# Patient Record
Sex: Male | Born: 1940 | Race: White | Hispanic: No | Smoking: Never smoker
Health system: Southern US, Community
[De-identification: ages and names within clinical notes are randomized; demographics above are authoritative.]

## PROBLEM LIST (undated history)

## (undated) DIAGNOSIS — F1020 Alcohol dependence, uncomplicated: Secondary | ICD-10-CM

## (undated) DIAGNOSIS — E785 Hyperlipidemia, unspecified: Secondary | ICD-10-CM

## (undated) DIAGNOSIS — I10 Essential (primary) hypertension: Secondary | ICD-10-CM

## (undated) HISTORY — PX: SHOULDER SURGERY: SHX246

## (undated) HISTORY — DX: Essential (primary) hypertension: I10

## (undated) HISTORY — PX: CHOLECYSTECTOMY: SHX55

## (undated) HISTORY — PX: REVISION TOTAL HIP ARTHROPLASTY: SHX766

## (undated) HISTORY — PX: KNEE ARTHROPLASTY: SHX992

---

## 2020-02-16 ENCOUNTER — Encounter: Payer: Self-pay | Admitting: Student in an Organized Health Care Education/Training Program

## 2020-02-16 ENCOUNTER — Ambulatory Visit
Payer: Medicare Other | Attending: Student in an Organized Health Care Education/Training Program | Admitting: Student in an Organized Health Care Education/Training Program

## 2020-02-16 ENCOUNTER — Other Ambulatory Visit: Payer: Self-pay

## 2020-02-16 VITALS — BP 137/84 | HR 96 | Temp 98.0°F | Resp 18 | Ht 66.0 in | Wt 155.0 lb

## 2020-02-16 DIAGNOSIS — M533 Sacrococcygeal disorders, not elsewhere classified: Secondary | ICD-10-CM | POA: Diagnosis present

## 2020-02-16 DIAGNOSIS — G8929 Other chronic pain: Secondary | ICD-10-CM | POA: Insufficient documentation

## 2020-02-16 DIAGNOSIS — G894 Chronic pain syndrome: Secondary | ICD-10-CM | POA: Insufficient documentation

## 2020-02-16 DIAGNOSIS — Z96643 Presence of artificial hip joint, bilateral: Secondary | ICD-10-CM | POA: Diagnosis present

## 2020-02-16 DIAGNOSIS — M47816 Spondylosis without myelopathy or radiculopathy, lumbar region: Secondary | ICD-10-CM | POA: Insufficient documentation

## 2020-02-16 DIAGNOSIS — M5416 Radiculopathy, lumbar region: Secondary | ICD-10-CM | POA: Diagnosis not present

## 2020-02-16 DIAGNOSIS — M48062 Spinal stenosis, lumbar region with neurogenic claudication: Secondary | ICD-10-CM | POA: Insufficient documentation

## 2020-02-16 DIAGNOSIS — Z96653 Presence of artificial knee joint, bilateral: Secondary | ICD-10-CM | POA: Diagnosis present

## 2020-02-16 MED ORDER — GABAPENTIN 100 MG PO CAPS
ORAL_CAPSULE | ORAL | 0 refills | Status: DC
Start: 2020-02-16 — End: 2020-05-18

## 2020-02-16 NOTE — Progress Notes (Signed)
Safety precautions to be maintained throughout the outpatient stay will include: orient to surroundings, keep bed in low position, maintain call bell within reach at all times, provide assistance with transfer out of bed and ambulation.  

## 2020-02-16 NOTE — Progress Notes (Signed)
Patient: Zachary Fernandez  Service Category: E/M  Provider: Gillis Santa, MD  DOB: 1940-04-25  DOS: 02/16/2020  Referring Provider: Vicente Serene*  MRN: [id number]  Setting: Ambulatory outpatient  PCP: No primary care provider on file.  Type: New Patient  Specialty: Interventional Pain Management    Location: Office  Delivery: Face-to-face     Primary Reason(s) for Visit: Encounter for initial evaluation of one or more chronic problems (new to examiner) potentially causing chronic pain, and posing a threat to normal musculoskeletal function. (Level of risk: High) CC: Back Pain (Left, lower)  HPI  Zachary Fernandez is a 80 y.o. year old, male patient, who comes for the first time to our practice referred by Vicente Serene* for our initial evaluation of his chronic pain. He has Chronic SI joint pain; Status post hip replacement, bilateral; History of bilateral knee replacement; Spinal stenosis, lumbar region, with neurogenic claudication; Chronic radicular lumbar pain; Lumbar spondylosis; and Chronic pain syndrome on their problem list. Today he comes in for evaluation of his Back Pain (Left, lower)  Pain Assessment: Location: Lower,Left Back Radiating: denies Onset: More than a month ago Duration: Chronic pain Quality: Other (Comment) (pinching) Severity: 2 /10 (subjective, self-reported pain score)  Effect on ADL: none Timing: Constant Modifying factors: Tramadol BP: 137/84  HR: 96  Onset and Duration: Sudden and Present longer than 3 months Cause of pain: Unknown Severity: No change since onset, NAS-11 at its worse: 5/10, NAS-11 at its best: 2/10, NAS-11 now: 3/10 and NAS-11 on the average: 3/10 Timing: Not influenced by the time of the day, During activity or exercise and After activity or exercise Aggravating Factors: Bending, Climbing, Lifiting, Motion, Stooping , Twisting and Working Alleviating Factors: Medications, Nerve blocks, Sleeping and Chiropractic  manipulations Associated Problems: Inability to control bowel and Pain that wakes patient up Quality of Pain: Aching, Intermittent, Deep, Nagging, Shooting and Stabbing Previous Examinations or Tests: Nerve block, Nerve conduction test and Chiropractic evaluation Previous Treatments: Chiropractic manipulations, Epidural steroid injections and Narcotic medications  Zachary Fernandez is a pleasant 80 year old male who has recently moved from [place].  He has a history of chronic pain.  He was being seen by pain management in [place].  He is managed on tramadol 50 mg 3 times daily as needed which he takes very sporadically.  He has a history of bilateral hip replacement, bilateral knee replacement.  He has a history of low back pain related to lumbar facet arthropathy, lumbar spondylosis, lumbar degenerative disc disease.  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  Patient has had many lumbar epidural steroid injections in [place] which were somewhat helpful for his low back and leg pain.  Most recently, he is status post left S1-S2-S3 lateral branch nerve block with Dr. Shelton Silvas who was his pain physician and [facility] [place] [facility].  Patient also has had left sacroiliac 1 injections in the past.  He has not had a lateral branch RFA to his knowledge.  Patient's most recent MRI from 2019 shows L1 compression fracture with anterior wedging but no retropulsion, multilevel lumbar degenerative disc disease with bilateral facet degenerative joint disease resulting in mild central canal stenosis at L2-L3 and moderate bilateral neuroforaminal stenosis at L2-L3, L3-4, L4-5 and L5-S1.  He has done physical therapy in the past and goes to the gym every other day for 30 minutes.  He denies having tried gabapentin or Lyrica in the past.   Historic  Controlled Substance Pharmacotherapy Review  PMP and historical list of controlled substances: 02/05/2020  10/29/2019   1   Tramadol Hcl 50 Mg Tablet  90.00  30  Ch Chr  [id number]  Exp ([id number])  2/2  15.00 MME  Comm Ins  [place]     Historical Monitoring: The patient  reports no history of drug use. List of all UDS Test(s): No results found for: MDMA, COCAINSCRNUR, [place], [place], CANNABQUANT, [place], [place] List of other Serum/Urine Drug Screening Test(s):  No results found for: AMPHSCRSER, BARBSCRSER, BENZOSCRSER, COCAINSCRSER, COCAINSCRNUR, PCPSCRSER, PCPQUANT, THCSCRSER, THCU, CANNABQUANT, OPIATESCRSER, OXYSCRSER, PROPOXSCRSER, ETH Historical Background Evaluation: [name] PMP: PDMP not reviewed this encounter. Online review of the past 55-monthperiod conducted.             [place] Department of public safety, offender search: (Editor, commissioningInformation) Non-contributory Risk Assessment Profile: Aberrant behavior: None observed or detected today Risk factors for fatal opioid overdose: None identified today Fatal overdose hazard ratio (HR): Calculation deferred Non-fatal overdose hazard ratio (HR): Calculation deferred Risk of opioid abuse or dependence: 0.7-3.0% with doses ? 36 MME/day and 6.1-26% with doses ? 120 MME/day. Substance use disorder (SUD) risk level: See below Personal History of Substance Abuse (SUD-Substance use disorder):  Alcohol: Negative  Illegal Drugs: Negative  Rx Drugs: Negative  ORT Risk Level calculation: Low Risk  Opioid Risk Tool - 02/16/20 1135      Family History of Substance Abuse   Alcohol Negative    Illegal Drugs Negative    Rx Drugs Negative      Personal History of Substance Abuse   Alcohol Negative    Illegal Drugs Negative    Rx Drugs Negative      Age   Age between 122-45years  No      History of Preadolescent Sexual Abuse   History of Preadolescent Sexual Abuse Negative or Male      Psychological Disease   Psychological Disease Negative    Depression Negative      Total Score   Opioid Risk Tool Scoring 0    Opioid Risk Interpretation Low Risk          ORT  Scoring interpretation table:  Score <3 = Low Risk for SUD  Score between 4-7 = Moderate Risk for SUD  Score >8 = High Risk for Opioid Abuse   PHQ-2 Depression Scale:  Total score: 0  PHQ-2 Scoring interpretation table: (Score and probability of major depressive disorder)  Score 0 = No depression  Score 1 = 15.4% Probability  Score 2 = 21.1% Probability  Score 3 = 38.4% Probability  Score 4 = 45.5% Probability  Score 5 = 56.4% Probability  Score 6 = 78.6% Probability   PHQ-9 Depression Scale:  Total score: 0  PHQ-9 Scoring interpretation table:  Score 0-4 = No depression  Score 5-9 = Mild depression  Score 10-14 = Moderate depression  Score 15-19 = Moderately severe depression  Score 20-27 = Severe depression (2.4 times higher risk of SUD and 2.89 times higher risk of overuse)   Pharmacologic Plan: As per protocol, I have not taken over any controlled substance management, pending the results of ordered tests and/or consults.            Initial impression: Pending review of available data and ordered tests.  Meds   Current Outpatient Medications:  .  amLODipine (NORVASC) 5 MG tablet, Take 5 mg by mouth daily., Disp: , Rfl:  .  Barberry-Oreg Grape-Goldenseal (BERBERINE COMPLEX PO),  Take by mouth daily., Disp: , Rfl:  .  bisoprolol-hydrochlorothiazide (ZIAC) 5-6.25 MG tablet, Take 1 tablet by mouth daily., Disp: , Rfl:  .  Calcium Citrate (CITRACAL PO), Take 650 mg by mouth. 2 tabs in am, Disp: , Rfl:  .  desipramine (NORPRAMIN) 25 MG tablet, Take 25 mg by mouth at bedtime., Disp: , Rfl:  .  gabapentin (NEURONTIN) 100 MG capsule, Take 1 capsule (100 mg total) by mouth at bedtime for 10 days, THEN 2 capsules (200 mg total) at bedtime for 10 days, THEN 3 capsules (300 mg total) at bedtime for 20 days. Follow written titration schedule.., Disp: 90 capsule, Rfl: 0 .  Multiple Vitamins-Minerals (CENTRUM SILVER PO), Take by mouth daily., Disp: , Rfl:  .  rosuvastatin (CRESTOR) 20 MG  tablet, Take 20 mg by mouth daily., Disp: , Rfl:  .  tamsulosin (FLOMAX) 0.4 MG CAPS capsule, Take 0.4 mg by mouth., Disp: , Rfl:  .  Testosterone 10 MG/ACT (2%) GEL, Place 4 Pump onto the skin daily., Disp: , Rfl:  .  traMADol (ULTRAM) 50 MG tablet, Take by mouth as needed., Disp: , Rfl:  .  TURMERIC PO, Take by mouth daily., Disp: , Rfl:    ROS  Cardiovascular: High blood pressure Pulmonary or Respiratory: Snoring  Neurological: No reported neurological signs or symptoms such as seizures, abnormal skin sensations, urinary and/or fecal incontinence, being born with an abnormal open spine and/or a tethered spinal cord Psychological-Psychiatric: No reported psychological or psychiatric signs or symptoms such as difficulty sleeping, anxiety, depression, delusions or hallucinations (schizophrenial), mood swings (bipolar disorders) or suicidal ideations or attempts Gastrointestinal: Heartburn due to stomach pushing into lungs (Hiatal hernia) and Alternating episodes 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) Genitourinary: No reported renal or genitourinary signs or symptoms such as difficulty voiding or producing urine, peeing blood, non-functioning kidney, kidney stones, difficulty emptying the bladder, difficulty controlling the flow of urine, or chronic kidney disease Hematological: Brusing easily Endocrine: No reported endocrine signs or symptoms such as high or low blood sugar, rapid heart rate due to high thyroid levels, obesity or weight gain due to slow thyroid or thyroid disease Rheumatologic: Joint aches and or swelling due to excess weight (Osteoarthritis) Musculoskeletal: Negative for myasthenia gravis, muscular dystrophy, multiple sclerosis or malignant hyperthermia Work History: Retired  Allergies  Mr. Cumming has No Known Allergies.  Laboratory Chemistry Profile   Renal No results found for: BUN, CREATININE, LABCREA, BCR, GFR, GFRAA, GFRNONAA, SPECGRAV, PHUR,  PROTEINUR   Electrolytes No results found for: NA, K, CL, CALCIUM, MG, PHOS   Hepatic No results found for: AST, ALT, ALBUMIN, ALKPHOS, AMYLASE, LIPASE, AMMONIA   ID No results found for: LYMEIGGIGMAB, HIV, SARSCOV2NAA, STAPHAUREUS, MRSAPCR, HCVAB, PREGTESTUR, RMSFIGG, QFVRPH1IGG, QFVRPH2IGG, LYMEIGGIGMAB   Bone No results found for: VD25OH, CB762GB1DVV, OH6073XT0, GY6948NI6, 25OHVITD1, 25OHVITD2, 25OHVITD3, TESTOFREE, TESTOSTERONE   Endocrine No results found for: GLUCOSE, GLUCOSEU, HGBA1C, TSH, FREET4, TESTOFREE, TESTOSTERONE, SHBG, ESTRADIOL, ESTRADIOLPCT, ESTRADIOLFRE, LABPREG, ACTH, CRTSLPL, UCORFRPERLTR, UCORFRPERDAY, CORTISOLBASE, LABPREG   Neuropathy No results found for: VITAMINB12, FOLATE, HGBA1C, HIV   CNS No results found for: COLORCSF, APPEARCSF, RBCCOUNTCSF, WBCCSF, POLYSCSF, LYMPHSCSF, EOSCSF, PROTEINCSF, GLUCCSF, JCVIRUS, CSFOLI, IGGCSF, LABACHR, ACETBL, LABACHR, ACETBL   Inflammation (CRP: Acute  ESR: Chronic) No results found for: CRP, ESRSEDRATE, LATICACIDVEN   Rheumatology No results found for: RF, ANA, LABURIC, URICUR, LYMEIGGIGMAB, LYMEABIGMQN, HLAB27   Coagulation No results found for: INR, LABPROT, APTT, PLT, DDIMER, LABHEMA, VITAMINK1, AT3   Cardiovascular No results found for: BNP,  CKTOTAL, CKMB, TROPONINI, HGB, HCT, LABVMA, EPIRU, EPINEPH24HUR, NOREPRU, NOREPI24HUR, DOPARU, DOPAM24HRUR   Screening No results found for: [place], [place], STAPHAUREUS, MRSAPCR, HCVAB, HIV, PREGTESTUR   Cancer No results found for: CEA, CA125, LABCA2   Allergens No results found for: ALMOND, APPLE, ASPARAGUS, AVOCADO, BANANA, BARLEY, BASIL, BAYLE[place], GREENBEAN, LIMABEAN, WHITEBEAN, BEEFIGE, REDBEET, BLUEBERRY, BROCCOLI, CABBAGE, MELON, CARROT, CASEIN, CASHEWNUT, CAULIFLOWER, CELERY     Note: Lab results reviewed.  PFSH  Drug: Mr. Cieslinski  reports no history of drug use. Alcohol:  reports current alcohol use. Tobacco:  reports that he has never smoked. He has  never used smokeless tobacco. Medical:  has a past medical history of Hypertension. Family: family history is not on file.   Active Ambulatory Problems    Diagnosis Date Noted  . 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/16/2020  . Status post hip replacement, 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  . 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  . Spinal stenosis, lumbar region, 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  . 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  . Lumbar spondylosis 02/16/2020  . Chronic pain syndrome 02/16/2020   Resolved Ambulatory Problems    Diagnosis Date Noted  . No Resolved Ambulatory Problems   Past Medical History:  Diagnosis Date  . Hypertension    Constitutional Exam  General appearance: Well nourished, well developed, and well hydrated. In no apparent acute distress Vitals:   02/16/20 1117  BP: 137/84  Pulse: 96  Resp: 18  Temp: 98 F (36.7 C)  TempSrc: Temporal  SpO2: 100%  Weight: 155 lb (70.3 kg)  Height: 5' 6"  (1.676 m)   BMI Assessment: Estimated body mass index is 25.02 kg/m as calculated from the following:   Height as of this encounter: 5' 6"  (1.676 m).   Weight as of this encounter: 155 lb (70.3 kg).  BMI interpretation table: BMI level Category Range association with higher incidence of chronic pain  <18 kg/m2 Underweight   18.5-24.9 kg/m2 Ideal body weight   25-29.9 kg/m2 Overweight Increased incidence by 20%  30-34.9 kg/m2 Obese (Class I) Increased incidence by 68%  35-39.9 kg/m2 Severe obesity (Class II) Increased incidence by 136%  >40 kg/m2 Extreme obesity (Class III) Increased incidence by 254%   Patient's current BMI Ideal Body weight  Body mass index is 25.02 kg/m. Ideal body weight: 63.8 kg (140 lb 10.5 oz) Adjusted ideal body weight: 66.4 kg (146 lb 6.3 oz)   BMI Readings from Last 4 Encounters:  02/16/20 25.02 kg/m   Wt Readings from Last 4 Encounters:  02/16/20 155 lb (70.3 kg)    Psych/Mental status: Alert, oriented x 3 (person,  place, & time)       Eyes: PERLA Respiratory: No evidence of acute respiratory distress  Cervical Spine Exam  Skin & Axial Inspection: No masses, redness, edema, swelling, or associated skin lesions Alignment: Symmetrical Functional ROM: Unrestricted ROM      Stability: No instability detected Muscle Tone/Strength: Functionally intact. No obvious neuro-muscular anomalies detected. Sensory (Neurological): Unimpaired Palpation: No palpable anomalies              Upper Extremity (UE) Exam    Side: Right upper extremity  Side: Left upper extremity  Skin & Extremity Inspection: Skin color, temperature, and hair growth are WNL. No peripheral edema or cyanosis. No masses, redness, swelling, asymmetry, or associated skin lesions. No contractures.  Skin & Extremity Inspection: Skin color, temperature, and hair growth are WNL. No peripheral edema or cyanosis. No masses, redness, swelling, asymmetry, or associated skin lesions. No  contractures.  Functional ROM: Unrestricted ROM          Functional ROM: Unrestricted ROM          Muscle Tone/Strength: Functionally intact. No obvious neuro-muscular anomalies detected.   Muscle Tone/Strength: Functionally intact. No obvious neuro-muscular anomalies detected.  Sensory (Neurological): Unimpaired          Sensory (Neurological): Unimpaired          Palpation: No palpable anomalies              Palpation: No palpable anomalies              Provocative Test(s):  Phalen's test: deferred Tinel's test: deferred Apley's scratch test (touch opposite shoulder):  Action 1 (Across chest): deferred Action 2 (Overhead): deferred Action 3 (LB reach): deferred   Provocative Test(s):  Phalen's test: deferred Tinel's test: deferred Apley's scratch test (touch opposite shoulder):  Action 1 (Across chest): deferred Action 2 (Overhead): deferred Action 3 (LB reach): deferred    Thoracic Spine Area Exam  Skin & Axial Inspection: No masses, redness, or  swelling Alignment: Symmetrical Functional ROM: Unrestricted ROM Stability: No instability detected Muscle Tone/Strength: Functionally intact. No obvious neuro-muscular anomalies detected. Sensory (Neurological): Unimpaired Muscle strength & Tone: No palpable anomalies Carnett's test*: deferred (*Carnett's sign is a clinical examination finding that is useful for confirming whether pain originates from the abdominal viscera or from the abdominal wall. During testing for Carnett's sign, the investigator identifies the point of maximal abdominal pain by deeply palpating with a finger; the patient is then asked to tense the abdominal muscles while the fingertip is released, followed again by deep palpation. If both stages of the test are painful, the source of the pain is the abdominal wall. In contrast, pain originating from the abdominal organs is associated with just a painful first stage) Lumbar Exam  Skin & Axial Inspection: No masses, redness, or swelling Alignment: Symmetrical Functional ROM: Unrestricted ROM       Stability: No instability detected Muscle Tone/Strength: Functionally intact. No obvious neuro-muscular anomalies detected. Sensory (Neurological): Musculoskeletal pain pattern   Gait & Posture Assessment  Ambulation: Unassisted Gait: Relatively normal for age and body habitus Posture: WNL   Lower Extremity Exam    Side: Right lower extremity  Side: Left lower extremity  Stability: No instability observed          Stability: No instability observed          Skin & Extremity Inspection: Evidence of prior arthroplastic surgery  Skin & Extremity Inspection: Evidence of prior arthroplastic surgery  Functional ROM: Mechanically restricted ROM for hip and knee joints          Functional ROM: Mechanically restricted ROM for hip and knee joints          Muscle Tone/Strength: Functionally intact. No obvious neuro-muscular anomalies detected.  Muscle Tone/Strength: Functionally  intact. No obvious neuro-muscular anomalies detected.  Sensory (Neurological): Unimpaired        Sensory (Neurological): Unimpaired        DTR: Patellar: deferred today Achilles: deferred today Plantar: deferred today  DTR: Patellar: deferred today Achilles: deferred today Plantar: deferred today  Palpation: No palpable anomalies  Palpation: No palpable anomalies   Assessment  Primary Diagnosis & Pertinent Problem List: The primary encounter diagnosis was Chronic pain syndrome. Diagnoses of Lumbar spondylosis, Lumbar facet arthropathy, Chronic radicular lumbar pain, Spinal stenosis, lumbar region, with neurogenic claudication, History of bilateral knee replacement, Status post hip replacement, bilateral, and Chronic SI  joint pain were also pertinent to this visit.  Visit Diagnosis (New problems to examiner): 1. Chronic pain syndrome   2. Lumbar spondylosis   3. Lumbar facet arthropathy   4. Chronic radicular lumbar pain   5. Spinal stenosis, lumbar region, with neurogenic claudication   6. History of bilateral knee replacement   7. Status post hip replacement, bilateral   8. Chronic SI joint pain    Plan of Care (Initial workup plan)  Note: Mr. Wallman was reminded that as per protocol, today's visit has been an evaluation only. We have not taken over the patient's controlled substance management.   Lab Orders     Compliance Drug Analysis, Ur  Pharmacotherapy (current): Medications ordered:  Meds ordered this encounter  Medications  . gabapentin (NEURONTIN) 100 MG capsule    Sig: Take 1 capsule (100 mg total) by mouth at bedtime for 10 days, THEN 2 capsules (200 mg total) at bedtime for 10 days, THEN 3 capsules (300 mg total) at bedtime for 20 days. Follow written titration schedule.Marland Kitchen    Dispense:  90 capsule    Refill:  0    Fill one day early if pharmacy is closed on scheduled refill date. May substitute for generic if available.   Medications administered during this  visit: Stephens Shire had no medications administered during this visit.   Pharmacological management options:  Opioid Analgesics: The patient was informed that there is no guarantee that he would be a candidate for opioid analgesics. The decision will be made following CDC guidelines. This decision will be based on the results of diagnostic studies, as well as Mr. Behrle risk profile.   Membrane stabilizer: Trial of gabapentin as above  Muscle relaxant: To be determined at a later time  NSAID: To be determined at a later time  Other analgesic(s): To be determined at a later time   Interventional management options: Mr. Cheek was informed that there is no guarantee that he would be a candidate for interventional therapies. The decision will be based on the results of diagnostic studies, as well as Mr. Pedrosa risk profile.  Procedure(s) under consideration:  Lumbar epidural steroid injection Lumbar facet medial branch nerve blocks S1-S3 lateral branch nerve blocks SI joint injection Sprint peripheral nerve stimulation   Provider-requested follow-up: Return in about 2 weeks (around 03/01/2020) for 2nd pt visit.  Future Appointments  Date Time Provider [place]  02/24/2020 10:40 AM Gillis Santa, MD [facility] None    Note by: Gillis Santa, MD Date: 02/16/2020; Time: 2:41 PM

## 2020-02-19 LAB — COMPLIANCE DRUG ANALYSIS, UR

## 2020-02-24 ENCOUNTER — Ambulatory Visit
Payer: Medicare Other | Attending: Student in an Organized Health Care Education/Training Program | Admitting: Student in an Organized Health Care Education/Training Program

## 2020-02-24 ENCOUNTER — Other Ambulatory Visit: Payer: Self-pay

## 2020-02-24 ENCOUNTER — Encounter: Payer: Self-pay | Admitting: Student in an Organized Health Care Education/Training Program

## 2020-02-24 VITALS — BP 127/82 | HR 83 | Temp 97.2°F | Resp 16 | Ht 66.0 in | Wt 155.0 lb

## 2020-02-24 DIAGNOSIS — Z96643 Presence of artificial hip joint, bilateral: Secondary | ICD-10-CM | POA: Diagnosis present

## 2020-02-24 DIAGNOSIS — M5416 Radiculopathy, lumbar region: Secondary | ICD-10-CM | POA: Diagnosis not present

## 2020-02-24 DIAGNOSIS — G894 Chronic pain syndrome: Secondary | ICD-10-CM | POA: Insufficient documentation

## 2020-02-24 DIAGNOSIS — G8929 Other chronic pain: Secondary | ICD-10-CM | POA: Insufficient documentation

## 2020-02-24 DIAGNOSIS — Z0289 Encounter for other administrative examinations: Secondary | ICD-10-CM | POA: Insufficient documentation

## 2020-02-24 DIAGNOSIS — M47816 Spondylosis without myelopathy or radiculopathy, lumbar region: Secondary | ICD-10-CM | POA: Insufficient documentation

## 2020-02-24 DIAGNOSIS — M48062 Spinal stenosis, lumbar region with neurogenic claudication: Secondary | ICD-10-CM | POA: Insufficient documentation

## 2020-02-24 DIAGNOSIS — M533 Sacrococcygeal disorders, not elsewhere classified: Secondary | ICD-10-CM | POA: Diagnosis present

## 2020-02-24 DIAGNOSIS — Z96653 Presence of artificial knee joint, bilateral: Secondary | ICD-10-CM | POA: Diagnosis present

## 2020-02-24 MED ORDER — TRAMADOL HCL 50 MG PO TABS: 50.0000 mg | ORAL_TABLET | Freq: Three times a day (TID) | ORAL | 2 refills | Status: DC | PRN

## 2020-02-24 NOTE — Progress Notes (Signed)
PROVIDER NOTE: Information contained herein reflects review and annotations entered in association with encounter. Interpretation of such information and data should be left to medically-trained personnel. Information provided to patient can be located elsewhere in the medical record under "Patient Instructions". Document created using STT-dictation technology, any transcriptional errors that may result from process are unintentional.    Patient: Zachary Fernandez  Service Category: E/M  Provider: Gillis Santa, MD  DOB: [birth year]  DOS: 02/24/2020  Specialty: Interventional Pain Management  MRN: [id number]  Setting: Ambulatory outpatient  PCP: No primary care provider on file.  Type: Established Patient    Referring Provider: No ref. provider found  Location: Office  Delivery: Face-to-face     Primary Reason(s) for Visit: Encounter for evaluation before starting new chronic pain management plan of care (Level of risk: moderate) CC: Hip Pain (left)  HPI  Zachary Fernandez is a 80 y.o. year old, male patient, who comes today for a follow-up evaluation to review the test results and decide on a treatment plan. He has Chronic SI joint pain; Status post hip replacement, bilateral; History of bilateral knee replacement; Spinal stenosis, lumbar region, with neurogenic claudication; Chronic radicular lumbar pain; Lumbar spondylosis; Chronic pain syndrome; and Pain management contract signed on their problem list. His primarily concern today is the Hip Pain (left)  Pain Assessment: Location: Left Hip Radiating: both legs Onset: More than a month ago Duration: Chronic pain Quality: Sharp Severity: 2 /10 (subjective, self-reported pain score)  Effect on ADL:   Timing: Intermittent Modifying factors: epidurals, Tramadol BP: 127/82  HR: 83  Zachary Fernandez comes in today for a follow-up visit after his initial evaluation on 02/16/2020.   UDS  appropriate.  Continues gabapentin 100 to 200 mg nightly.  We will send in  prescription for tramadol as below and have patient sign pain contract.  Follow-up as needed for lumbar epidural steroid injection, 3 months for medication management.  HPI: 02/24/20: Zachary Fernandez is a pleasant 80 year old male who has recently moved from [facility].  He has a history of chronic pain.  He was being seen by pain management in [place].  He is managed on tramadol 50 mg 3 times daily as needed which he takes very sporadically.  He has a history of bilateral hip replacement, bilateral knee replacement.  He has a history of low back pain related to lumbar facet arthropathy, lumbar spondylosis, lumbar degenerative disc disease.  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  Patient has had many lumbar epidural steroid injections in [place] which were somewhat helpful for his low back and leg pain.  Most recently, he is status post left S1-S2-S3 lateral branch nerve block with Dr. Shelton Silvas who was his pain physician and [facility] in the [facility] [place] [facility].  Patient also has had left sacroiliac 1 injections in the past.  He has not had a lateral branch RFA to his knowledge.  Patient's most recent MRI from 2019 shows L1 compression fracture with anterior wedging but no retropulsion, multilevel lumbar degenerative disc disease with bilateral facet degenerative joint disease resulting in mild central canal stenosis at L2-L3 and moderate bilateral neuroforaminal stenosis at L2-L3, L3-4, L4-5 and L5-S1.  He has done physical therapy in the past and goes to the gym every other day for 30 minutes.  He denies having tried gabapentin or Lyrica in the past.  Controlled Substance Pharmacotherapy Assessment REMS (Risk Evaluation and Mitigation Strategy)  Analgesic: Tramadol 50 mg TID prn  Pill Count: None expected due to no prior prescriptions written by our practice. [name], RN  02/24/2020 10:55 AM  Sign when Signing Visit Safety precautions to be maintained throughout  the outpatient stay will include: orient to surroundings, keep bed in low position, maintain call bell within reach at all times, provide assistance with transfer out of bed and ambulation.    Pharmacokinetics: Liberation and absorption (onset of action): WNL Distribution (time to peak effect): WNL Metabolism and excretion (duration of action): WNL         Pharmacodynamics: Desired effects: Analgesia: Zachary Fernandez reports >50% benefit. Functional ability: Patient reports that medication allows him to accomplish basic ADLs Clinically meaningful improvement in function (CMIF): Sustained CMIF goals met Perceived effectiveness: Described as relatively effective, allowing for increase in activities of daily living (ADL) Undesirable effects: Side-effects or Adverse reactions: None reported Monitoring:  PMP: PDMP not reviewed this encounter. Online review of the past 24-monthperiod previously conducted. Not applicable at this point since we have not taken over the patient's medication management yet. List of other Serum/Urine Drug Screening Test(s):  No results found for: AMPHSCRSER, BARBSCRSER, BENZOSCRSER, COCAINSCRSER, COCAINSCRNUR, PCPSCRSER, THCSCRSER, THCU, CANNABQUANT, OCarson [place]terling [place] EMicroList of all UDS test(s) done:  Lab Results  Component Value Date   SUMMARY Note 02/16/2020   Last UDS on record: Summary  Date Value Ref Range Status  02/16/2020 Note  Final    Comment:    ==================================================================== Compliance Drug Analysis, Ur ==================================================================== Test                             Result       Flag       Units  Drug Present and Declared for Prescription Verification   Tramadol                       >3247        EXPECTED   ng/mg creat   O-Desmethyltramadol            >3247        EXPECTED   ng/mg creat   N-Desmethyltramadol            964          EXPECTED   ng/mg  creat    Source of tramadol is a prescription medication. O-desmethyltramadol    and N-desmethyltramadol are expected metabolites of tramadol.    Desipramine                    PRESENT      EXPECTED    Desipramine may be administered as a prescription drug; it is also    an expected metabolite of imipramine.  Drug Absent but Declared for Prescription Verification   Gabapentin                     Not Detected UNEXPECTED ==================================================================== Test                      Result    Flag   Units      Ref Range   Creatinine              154              mg/dL      >=20 ==================================================================== Declared Medications:  The flagging and interpretation on this report are based on the  following declared medications.  Unexpected results may arise from  inaccuracies in the declared medications.   **Note: The testing scope of this panel includes these medications:   Desipramine (Norpramin)  Gabapentin (Neurontin)  Tramadol (Ultram)   **Note: The testing scope of this panel does not include the  following reported medications:   Amlodipine (Norvasc)  Bisoprolol  Calcium  Hydrochlorothiazide  Multivitamin  Rosuvastatin (Crestor)  Supplement  Tamsulosin (Flomax)  Testosterone  Turmeric ==================================================================== For clinical consultation, please call [phone]. ====================================================================    UDS interpretation: No unexpected findings.          Medication Assessment Form: Patient introduced to form today Treatment compliance: Treatment may start today if patient agrees with proposed plan. Evaluation of compliance is not applicable at this point Risk Assessment Profile: Aberrant behavior: See initial evaluations. None observed or detected today Comorbid factors increasing risk of overdose: See initial evaluation.  No additional risks detected today Opioid risk tool (ORT):  Opioid Risk  02/16/2020  Alcohol 0  Illegal Drugs 0  Rx Drugs 0  Alcohol 0  Illegal Drugs 0  Rx Drugs 0  Age between 16-45 years  0  History of Preadolescent Sexual Abuse 0  Psychological Disease 0  Depression 0  Opioid Risk Tool Scoring 0  Opioid Risk Interpretation Low Risk    ORT Scoring interpretation table:  Score <3 = Low Risk for SUD  Score between 4-7 = Moderate Risk for SUD  Score >8 = High Risk for Opioid Abuse   Risk of substance use disorder (SUD): Low  Risk Mitigation Strategies:  Patient opioid safety counseling: Completed today. Counseling provided to patient as per "Patient Counseling Document". Document signed by patient, attesting to counseling and understanding Patient-Prescriber Agreement (PPA): Obtained today.  Controlled substance notification to other providers: Written and sent today.  Pharmacologic Plan: Today we may be taking over the patient's pharmacological regimen. See below.               Meds   Current Outpatient Medications:  .  amLODipine (NORVASC) 5 MG tablet, Take 5 mg by mouth daily., Disp: , Rfl:  .  Barberry-Oreg Grape-Goldenseal (BERBERINE COMPLEX PO), Take by mouth daily., Disp: , Rfl:  .  bisoprolol-hydrochlorothiazide (ZIAC) 5-6.25 MG tablet, Take 1 tablet by mouth daily., Disp: , Rfl:  .  Calcium Citrate (CITRACAL PO), Take 650 mg by mouth. 2 tabs in am, Disp: , Rfl:  .  desipramine (NORPRAMIN) 25 MG tablet, Take 25 mg by mouth at bedtime., Disp: , Rfl:  .  gabapentin (NEURONTIN) 100 MG capsule, Take 1 capsule (100 mg total) by mouth at bedtime for 10 days, THEN 2 capsules (200 mg total) at bedtime for 10 days, THEN 3 capsules (300 mg total) at bedtime for 20 days. Follow written titration schedule.., Disp: 90 capsule, Rfl: 0 .  Multiple Vitamins-Minerals (CENTRUM SILVER PO), Take by mouth daily., Disp: , Rfl:  .  rosuvastatin (CRESTOR) 20 MG tablet, Take 20 mg by mouth  daily., Disp: , Rfl:  .  tamsulosin (FLOMAX) 0.4 MG CAPS capsule, Take 0.4 mg by mouth., Disp: , Rfl:  .  Testosterone 10 MG/ACT (2%) GEL, Place 4 Pump onto the skin daily., Disp: , Rfl:  .  traMADol (ULTRAM) 50 MG tablet, Take 1 tablet (50 mg total) by mouth every 8 (eight) hours as needed for severe pain. For chronic pain syndrome, Disp: 90 tablet, Rfl: 2 .  TURMERIC PO, Take by mouth daily., Disp: , Rfl:   ROS  Constitutional: Denies any fever or chills Gastrointestinal: No reported hemesis, hematochezia, vomiting, or acute GI distress Musculoskeletal: Denies any acute onset joint swelling, redness, loss of ROM, or weakness Neurological: No reported episodes of acute onset apraxia, aphasia, dysarthria, agnosia, amnesia, paralysis, loss of coordination, or loss of consciousness  Allergies  Mr. Kueker has No Known Allergies.  PFSH  Drug: Mr. Smead  reports no history of drug use. Alcohol:  reports current alcohol use. Tobacco:  reports that he has never smoked. He has never used smokeless tobacco. Medical:  has a past medical history of Hypertension. Surgical: Mr. Demery  has a past surgical history that includes Revision total hip arthroplasty (Bilateral); Knee Arthroplasty (Left); Shoulder surgery (Left); and Cholecystectomy. Family: family history is not on file.  Constitutional Exam  General appearance: Well nourished, well developed, and well hydrated. In no apparent acute distress Vitals:   02/24/20 1052  BP: 127/82  Pulse: 83  Resp: 16  Temp: (!) 97.2 F (36.2 C)  TempSrc: Temporal  SpO2: 99%  Weight: 155 lb (70.3 kg)  Height: 5' 6"  (1.676 m)   BMI Assessment: Estimated body mass index is 25.02 kg/m as calculated from the following:   Height as of this encounter: 5' 6"  (1.676 m).   Weight as of this encounter: 155 lb (70.3 kg).  BMI interpretation table: BMI level Category Range association with higher incidence of chronic pain  <18 kg/m2 Underweight   18.5-24.9  kg/m2 Ideal body weight   25-29.9 kg/m2 Overweight Increased incidence by 20%  30-34.9 kg/m2 Obese (Class I) Increased incidence by 68%  35-39.9 kg/m2 Severe obesity (Class II) Increased incidence by 136%  >40 kg/m2 Extreme obesity (Class III) Increased incidence by 254%   Patient's current BMI Ideal Body weight  Body mass index is 25.02 kg/m. Ideal body weight: 63.8 kg (140 lb 10.5 oz) Adjusted ideal body weight: 66.4 kg (146 lb 6.3 oz)   BMI Readings from Last 4 Encounters:  02/24/20 25.02 kg/m  02/16/20 25.02 kg/m   Wt Readings from Last 4 Encounters:  02/24/20 155 lb (70.3 kg)  02/16/20 155 lb (70.3 kg)    Psych/Mental status: Alert, oriented x 3 (person, place, & time)       Eyes: PERLA Respiratory: No evidence of acute respiratory distress  Cervical Spine Exam  Skin & Axial Inspection: No masses, redness, edema, swelling, or associated skin lesions Alignment: Symmetrical Functional ROM: Unrestricted ROM      Stability: No instability detected Muscle Tone/Strength: Functionally intact. No obvious neuro-muscular anomalies detected. Sensory (Neurological): Unimpaired Palpation: No palpable anomalies                    Upper Extremity (UE) Exam    Side: Right upper extremity  Side: Left upper extremity   Skin & Extremity Inspection: Skin color, temperature, and hair growth are WNL. No peripheral edema or cyanosis. No masses, redness, swelling, asymmetry, or associated skin lesions. No contractures.  Skin & Extremity Inspection: Skin color, temperature, and hair growth are WNL. No peripheral edema or cyanosis. No masses, redness, swelling, asymmetry, or associated skin lesions. No contractures.   Functional ROM: Unrestricted ROM          Functional ROM: Unrestricted ROM           Muscle Tone/Strength: Functionally intact. No obvious neuro-muscular anomalies detected.   Muscle Tone/Strength: Functionally intact. No obvious neuro-muscular anomalies detected.    Sensory (Neurological): Unimpaired          Sensory (  Neurological): Unimpaired           Palpation: No palpable anomalies              Palpation: No palpable anomalies               Provocative Test(s):  Phalen's test: deferred Tinel's test: deferred Apley's scratch test (touch opposite shoulder):  Action 1 (Across chest): deferred Action 2 (Overhead): deferred Action 3 (LB reach): deferred   Provocative Test(s):  Phalen's test: deferred Tinel's test: deferred Apley's scratch test (touch opposite shoulder):  Action 1 (Across chest): deferred Action 2 (Overhead): deferred Action 3 (LB reach): deferred      Lumbar Exam  Skin & Axial Inspection: No masses, redness, or swelling Alignment: Symmetrical Functional ROM: Unrestricted ROM       Stability: No instability detected Muscle Tone/Strength: Functionally intact. No obvious neuro-muscular anomalies detected. Sensory (Neurological): Musculoskeletal pain pattern   Gait & Posture Assessment  Ambulation: Unassisted Gait: Relatively normal for age and body habitus Posture: WNL   Lower Extremity Exam    Side: Right lower extremity  Side: Left lower extremity  Stability: No instability observed          Stability: No instability observed          Skin & Extremity Inspection: Evidence of prior arthroplastic surgery  Skin & Extremity Inspection: Evidence of prior arthroplastic surgery  Functional ROM: Mechanically restricted ROM for hip and knee joints          Functional ROM: Mechanically restricted ROM for hip and knee joints          Muscle Tone/Strength: Functionally intact. No obvious neuro-muscular anomalies detected.  Muscle Tone/Strength: Functionally intact. No obvious neuro-muscular anomalies detected.  Sensory (Neurological): Unimpaired        Sensory (Neurological): Unimpaired        DTR: Patellar: deferred today Achilles: deferred today Plantar: deferred today  DTR: Patellar: deferred  today Achilles: deferred today Plantar: deferred today  Palpation: No palpable anomalies  Palpation: No palpable anomalies     Assessment & Plan  Primary Diagnosis & Pertinent Problem List: The primary encounter diagnosis was Chronic pain syndrome. Diagnoses of Lumbar spondylosis, Lumbar facet arthropathy, Chronic radicular lumbar pain, Spinal stenosis, lumbar region, with neurogenic claudication, History of bilateral knee replacement, Status post hip replacement, bilateral, Chronic SI joint pain, and Pain management contract signed were also pertinent to this visit.  Visit Diagnosis: 1. Chronic pain syndrome   2. Lumbar spondylosis   3. Lumbar facet arthropathy   4. Chronic radicular lumbar pain   5. Spinal stenosis, lumbar region, with neurogenic claudication   6. History of bilateral knee replacement   7. Status post hip replacement, bilateral   8. Chronic SI joint pain   9. Pain management contract signed    Problems updated and reviewed during this visit: Problem  Pain Management Contract Signed    Plan of Care  Pharmacotherapy (Medications Ordered): Meds ordered this encounter  Medications  . traMADol (ULTRAM) 50 MG tablet    Sig: Take 1 tablet (50 mg total) by mouth every 8 (eight) hours as needed for severe pain. For chronic pain syndrome    Dispense:  90 tablet    Refill:  2    Fill one day early if pharmacy is closed on scheduled refill date. Do not fill until: To last until:    Procedure Orders     Lumbar Epidural Injection  Pharmacological management options:  Opioid Analgesics: We'll take over  management today. See above orders Membrane stabilizer: Currently on a membrane stabilizer Muscle relaxant: We have discussed the possibility of a trial NSAID: Trial discussed. Other analgesic(s): To be determined at a later time    Interventional management options:  Considering:   Lumbar epidural steroid injection Lumbar facet medial branch nerve blocks S1-S3  lateral branch nerve blocks SI joint injection Sprint peripheral nerve stimulation   PRN Procedures:   None at this time    Provider-requested follow-up: Return in about 3 months (around 05/23/2020) for Medication Management, in person. Recent Visits Date Type Provider Dept  02/16/20 Office Visit Gillis Santa, MD [facility]  Showing recent visits within past 90 days and meeting all other requirements Today's Visits Date Type Provider Dept  02/24/20 Office Visit Gillis Santa, MD [facility]  Showing today's visits and meeting all other requirements Future Appointments Date Type Provider Dept  05/18/20 Appointment Gillis Santa, MD [facility]  Showing future appointments within next 90 days and meeting all other requirements  Primary Care Physician: No primary care provider on file. Note by: Gillis Santa, MD Date: 02/24/2020; Time: 11:32 AM

## 2020-02-24 NOTE — Progress Notes (Signed)
Safety precautions to be maintained throughout the outpatient stay will include: orient to surroundings, keep bed in low position, maintain call bell within reach at all times, provide assistance with transfer out of bed and ambulation.  

## 2020-02-24 NOTE — Patient Instructions (Addendum)
1. Sign pain contract Epidural Steroid Injection Patient Information  Description: The epidural space surrounds the nerves as they exit the spinal cord.  In some patients, the nerves can be compressed and inflamed by a bulging disc or a tight spinal canal (spinal stenosis).  By injecting steroids into the epidural space, we can bring irritated nerves into direct contact with a potentially helpful medication.  These steroids act directly on the irritated nerves and can reduce swelling and inflammation which often leads to decreased pain.  Epidural steroids may be injected anywhere along the spine and from the neck to the low back depending upon the location of your pain.   After numbing the skin with local anesthetic (like Novocaine), a small needle is passed into the epidural space slowly.  You may experience a sensation of pressure while this is being done.  The entire block usually last less than 10 minutes.  Conditions which may be treated by epidural steroids:   Low back and leg pain  Neck and arm pain  Spinal stenosis  Post-laminectomy syndrome  Herpes zoster (shingles) pain  Pain from compression fractures  Preparation for the injection:  1. Do not eat any solid food or dairy products within 2 hours of your appointment.  2. You may drink clear liquids up to 2 hours before appointment.  Clear liquids include water, black coffee, juice or soda.  No milk or cream please. 3. You may take your regular medication, including pain medications, with a sip of water before your appointment  Diabetics should hold regular insulin (if taken separately) and take 1/2 normal NPH dos the morning of the procedure.  Carry some sugar containing items with you to your appointment. 4. A driver must accompany you and be prepared to drive you home after your procedure.  5. Bring all your current medications with your. 6. An IV may be inserted and sedation may be given at the discretion of the physician.    7. A blood pressure cuff, EKG and other monitors will often be applied during the procedure.  Some patients may need to have extra oxygen administered for a short period. 8. You will be asked to provide medical information, including your allergies, prior to the procedure.  We must know immediately if you are taking blood thinners (like Coumadin/Warfarin)  Or if you are allergic to IV iodine contrast (dye). We must know if you could possible be pregnant.  Possible side-effects:  Bleeding from needle site  Infection (rare, may require surgery)  Nerve injury (rare)  Numbness & tingling (temporary)  Difficulty urinating (rare, temporary)  Spinal headache ( a headache worse with upright posture)  Light -headedness (temporary)  Pain at injection site (several days)  Decreased blood pressure (temporary)  Weakness in arm/leg (temporary)  Pressure sensation in back/neck (temporary)  Call if you experience:  Fever/chills associated with headache or increased back/neck pain.  Headache worsened by an upright position.  New onset weakness or numbness of an extremity below the injection site  Hives or difficulty breathing (go to the emergency room)  Inflammation or drainage at the infection site  Severe back/neck pain  Any new symptoms which are concerning to you  Please note:  Although the local anesthetic injected can often make your back or neck feel good for several hours after the injection, the pain will likely return.  It takes 3-7 days for steroids to work in the epidural space.  You may not notice any pain relief for at least  that one week.  If effective, we will often do a series of three injections spaced 3-6 weeks apart to maximally decrease your pain.  After the initial series, we generally will wait several months before considering a repeat injection of the same type.  If you have any questions, please call [phone] [facility]

## 2020-03-26 ENCOUNTER — Other Ambulatory Visit: Payer: Self-pay | Admitting: Student in an Organized Health Care Education/Training Program

## 2020-03-26 DIAGNOSIS — G8929 Other chronic pain: Secondary | ICD-10-CM

## 2020-03-26 DIAGNOSIS — G894 Chronic pain syndrome: Secondary | ICD-10-CM

## 2020-03-26 DIAGNOSIS — M48062 Spinal stenosis, lumbar region with neurogenic claudication: Secondary | ICD-10-CM

## 2020-05-14 ENCOUNTER — Emergency Department
Admission: EM | Admit: 2020-05-14 | Discharge: 2020-05-14 | Disposition: A | Discharge status: Home / Self Care | Payer: Medicare Other | Attending: Emergency Medicine | Admitting: Emergency Medicine

## 2020-05-14 ENCOUNTER — Emergency Department: Payer: Medicare Other

## 2020-05-14 ENCOUNTER — Other Ambulatory Visit: Payer: Self-pay

## 2020-05-14 DIAGNOSIS — R519 Headache, unspecified: Secondary | ICD-10-CM | POA: Diagnosis not present

## 2020-05-14 DIAGNOSIS — Z79899 Other long term (current) drug therapy: Secondary | ICD-10-CM | POA: Insufficient documentation

## 2020-05-14 DIAGNOSIS — S12100A Unspecified displaced fracture of second cervical vertebra, initial encounter for closed fracture: Secondary | ICD-10-CM | POA: Insufficient documentation

## 2020-05-14 DIAGNOSIS — M542 Cervicalgia: Secondary | ICD-10-CM | POA: Diagnosis present

## 2020-05-14 DIAGNOSIS — K5730 Diverticulosis of large intestine without perforation or abscess without bleeding: Secondary | ICD-10-CM | POA: Insufficient documentation

## 2020-05-14 DIAGNOSIS — M4312 Spondylolisthesis, cervical region: Secondary | ICD-10-CM | POA: Insufficient documentation

## 2020-05-14 DIAGNOSIS — I10 Essential (primary) hypertension: Secondary | ICD-10-CM | POA: Diagnosis not present

## 2020-05-14 DIAGNOSIS — Z96642 Presence of left artificial hip joint: Secondary | ICD-10-CM | POA: Insufficient documentation

## 2020-05-14 DIAGNOSIS — Z96641 Presence of right artificial hip joint: Secondary | ICD-10-CM | POA: Diagnosis not present

## 2020-05-14 DIAGNOSIS — R42 Dizziness and giddiness: Secondary | ICD-10-CM | POA: Insufficient documentation

## 2020-05-14 LAB — CBC WITH DIFFERENTIAL/PLATELET
Abs Immature Granulocytes: 0.1 10*3/uL — ABNORMAL HIGH (ref 0.00–0.07)
Basophils Absolute: 0.1 10*3/uL (ref 0.0–0.1)
Basophils Relative: 1 %
Eosinophils Absolute: 0.1 10*3/uL (ref 0.0–0.5)
Eosinophils Relative: 1 %
HCT: 49.1 % (ref 39.0–52.0)
Hemoglobin: 17.2 g/dL — ABNORMAL HIGH (ref 13.0–17.0)
Immature Granulocytes: 1 %
Lymphocytes Relative: 10 %
Lymphs Abs: 1.2 10*3/uL (ref 0.7–4.0)
MCH: 33.1 pg (ref 26.0–34.0)
MCHC: 35 g/dL (ref 30.0–36.0)
MCV: 94.6 fL (ref 80.0–100.0)
Monocytes Absolute: 0.9 10*3/uL (ref 0.1–1.0)
Monocytes Relative: 8 %
Neutro Abs: 9.5 10*3/uL — ABNORMAL HIGH (ref 1.7–7.7)
Neutrophils Relative %: 79 %
Platelets: 168 10*3/uL (ref 150–400)
RBC: 5.19 MIL/uL (ref 4.22–5.81)
RDW: 13.3 % (ref 11.5–15.5)
WBC: 11.9 10*3/uL — ABNORMAL HIGH (ref 4.0–10.5)
nRBC: 0 % (ref 0.0–0.2)

## 2020-05-14 LAB — COMPREHENSIVE METABOLIC PANEL
ALT: 49 U/L — ABNORMAL HIGH (ref 0–44)
AST: 66 U/L — ABNORMAL HIGH (ref 15–41)
Albumin: 4.4 g/dL (ref 3.5–5.0)
Alkaline Phosphatase: 81 U/L (ref 38–126)
Anion gap: 10 (ref 5–15)
BUN: 12 mg/dL (ref 8–23)
CO2: 27 mmol/L (ref 22–32)
Calcium: 9.5 mg/dL (ref 8.9–10.3)
Chloride: 99 mmol/L (ref 98–111)
Creatinine, Ser: 0.77 mg/dL (ref 0.61–1.24)
GFR, Estimated: >60 mL/min (ref >60)
Glucose, Bld: 132 mg/dL — ABNORMAL HIGH (ref 70–99)
Potassium: 4.5 mmol/L (ref 3.5–5.1)
Sodium: 136 mmol/L (ref 135–145)
Total Bilirubin: 1.1 mg/dL (ref 0.3–1.2)
Total Protein: 7.5 g/dL (ref 6.5–8.1)
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
## 2020-05-14 MED ORDER — IOHEXOL 350 MG/ML SOLN
75.0000 mL | Freq: Once | INTRAVENOUS | Status: AC | PRN
  Administered 2020-05-14: 75 mL via INTRAVENOUS
  Filled 2020-05-14: qty 75

## 2020-05-14 MED ORDER — FENTANYL CITRATE (PF) 100 MCG/2ML IJ SOLN
50.0000 ug | Freq: Once | INTRAMUSCULAR | Status: AC
  Administered 2020-05-14: 50 ug via INTRAVENOUS
  Filled 2020-05-14: qty 2

## 2020-05-14 MED ORDER — MORPHINE SULFATE (PF) 4 MG/ML IV SOLN
4.0000 mg | Freq: Once | INTRAVENOUS | Status: AC
  Administered 2020-05-14: 4 mg via INTRAMUSCULAR
  Filled 2020-05-14: qty 1
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
## 2020-05-14 NOTE — ED Notes (Signed)
ED Provider at bedside. 

## 2020-05-14 NOTE — ED Notes (Signed)
Patient family member out in hallway stating that patient is in pain and would like medication. Provider aware.

## 2020-05-18 ENCOUNTER — Other Ambulatory Visit: Payer: Self-pay

## 2020-05-18 ENCOUNTER — Encounter: Payer: Self-pay | Admitting: Student in an Organized Health Care Education/Training Program

## 2020-05-18 ENCOUNTER — Ambulatory Visit
Payer: Medicare Other | Attending: Student in an Organized Health Care Education/Training Program | Admitting: Student in an Organized Health Care Education/Training Program

## 2020-05-18 VITALS — BP 125/73 | HR 66 | Temp 97.1°F | Resp 18 | Ht 67.0 in | Wt 157.0 lb

## 2020-05-18 DIAGNOSIS — S12101A Unspecified nondisplaced fracture of second cervical vertebra, initial encounter for closed fracture: Secondary | ICD-10-CM | POA: Insufficient documentation

## 2020-05-18 DIAGNOSIS — G8929 Other chronic pain: Secondary | ICD-10-CM

## 2020-05-18 DIAGNOSIS — S12101S Unspecified nondisplaced fracture of second cervical vertebra, sequela: Secondary | ICD-10-CM | POA: Diagnosis present

## 2020-05-18 DIAGNOSIS — S134XXS Sprain of ligaments of cervical spine, sequela: Secondary | ICD-10-CM

## 2020-05-18 DIAGNOSIS — M5416 Radiculopathy, lumbar region: Secondary | ICD-10-CM

## 2020-05-18 DIAGNOSIS — G894 Chronic pain syndrome: Secondary | ICD-10-CM | POA: Diagnosis present

## 2020-05-18 DIAGNOSIS — S134XXA Sprain of ligaments of cervical spine, initial encounter: Secondary | ICD-10-CM | POA: Insufficient documentation

## 2020-05-18 MED ORDER — TRAMADOL HCL 50 MG PO TABS: 50.0000 mg | ORAL_TABLET | Freq: Four times a day (QID) | ORAL | 1 refills | Status: DC | PRN

## 2020-05-18 MED ORDER — GABAPENTIN 300 MG PO CAPS: 300.0000 mg | ORAL_CAPSULE | Freq: Two times a day (BID) | ORAL | 1 refills | Status: DC

## 2020-05-18 NOTE — Progress Notes (Signed)
Safety precautions to be maintained throughout the outpatient stay will include: orient to surroundings, keep bed in low position, maintain call bell within reach at all times, provide assistance with transfer out of bed and ambulation.  

## 2020-05-18 NOTE — Progress Notes (Signed)
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 Cervical spondylosis with multilevel disc space narrowing, disc bulges, uncovertebral hypertrophy and facet arthrosis. No appreciable high-grade spinal canal stenosis. Multilevel bony neural foraminal narrowing.  Upper chest: No consolidation within the imaged lung apices. No visible pneumothorax.  CT cervical spine impression #1 called by telephone at the time of interpretation on 05/14/2020 at 5:58 pm to provider [facility] , who verbally acknowledged these results.  IMPRESSION: CT head:  1. No evidence of acute intracranial abnormality. 2. Mild cerebral atrophy and chronic small vessel ischemic disease.  CT maxillofacial:  1. No acute maxillofacial  fracture is identified. 2. Punctate hyperdensity along the anterolateral aspect of the left globe. This may reflect a calcification. However, correlate with physical exam findings to exclude an imbedded foreign body at this site. 3. Left periorbital soft tissue swelling/hematoma.  CT cervical spine:  1. Acute comminuted and displaced fracture of the C2 right lateral mass and transverse process/foramen transversarium. Bony fragments encroach upon the right C2 foramen transversarium. A CTA of the neck is recommended to exclude right vertebral artery injury at this site. 2. 2 mm C6-C7 grade 1 anterolisthesis. 3. Trace C7-T1 grade 1 anterolisthesis. 4. Multilevel cervical spondylosis and fusion, as described.  Electronically Signed   By: Kellie Simmering DO   On: 05/14/2020 17:59 Note: Reviewed        Physical Exam  General appearance: Well nourished, well developed, and well hydrated. In no apparent acute distress Mental status: Alert, oriented x 3 (person, place, & time)       Respiratory: No evidence of acute respiratory distress Eyes: PERLA Vitals: BP 125/73   Pulse 66   Temp (!) 97.1 F (36.2 C) (Temporal)   Resp 18   Ht 5' 7" (1.702 m)   Wt 157 lb (71.2 kg)   SpO2 98%   BMI 24.59 kg/m  BMI: Estimated body mass index is 24.59 kg/m as calculated from the following:   Height as of this encounter: 5' 7" (1.702 m).   Weight as of this encounter: 157 lb (71.2 kg). Ideal: Ideal body weight: 66.1 kg (145 lb 11.6 oz) Adjusted ideal body weight: 68.1 kg (150 lb 3.8 oz)  Cervical Spine Area Exam  Skin & Axial Inspection: C-collar in place Alignment: Symmetrical Functional ROM: Mechanically restricted ROM, bilaterally Stability: No instability detected Muscle Tone/Strength: Functionally intact. No obvious neuro-muscular anomalies detected. Sensory (Neurological): Neurogenic pain pattern and musculoskeletal Palpation: Complains of area being tender to  palpation             Upper  Extremity (UE) Exam    Side: Right upper extremity  Side: Left upper extremity  Skin & Extremity Inspection: Skin color, temperature, and hair growth are WNL. No peripheral edema or cyanosis. No masses, redness, swelling, asymmetry, or associated skin lesions. No contractures.  Skin & Extremity Inspection: Skin color, temperature, and hair growth are WNL. No peripheral edema or cyanosis. No masses, redness, swelling, asymmetry, or associated skin lesions. No contractures.  Functional ROM: Unrestricted ROM          Functional ROM: Unrestricted ROM          Muscle Tone/Strength: Functionally intact. No obvious neuro-muscular anomalies detected.  Muscle Tone/Strength: Functionally intact. No obvious neuro-muscular anomalies detected.  Sensory (Neurological): Musculoskeletal pain pattern          Sensory (Neurological): Musculoskeletal pain pattern          Palpation: No palpable anomalies              Palpation: No palpable anomalies              Provocative Test(s):  Phalen's test: deferred Tinel's test: deferred Apley's scratch test (touch opposite shoulder):  Action 1 (Across chest): Decreased ROM Action 2 (Overhead): Decreased ROM Action 3 (LB reach): Decreased ROM   Provocative Test(s):  Phalen's test: deferred Tinel's test: deferred Apley's scratch test (touch opposite shoulder):  Action 1 (Across chest): Decreased ROM Action 2 (Overhead): Decreased ROM Action 3 (LB reach): Decreased ROM     Assessment   Diagnosis  1. Whiplash injury to neck, sequela   2. Chronic pain syndrome   3. Chronic radicular lumbar pain   4. Closed nondisplaced fracture of second cervical vertebra, unspecified fracture morphology, sequela      Updated Problems: Problem  Whiplash Injury to Neck  Closed Nondisplaced Fracture of Second Cervical Vertebra (Hcc)    Plan of Care   Mr. Zakk Borgen has a current medication list which includes the following long-term medication(s): amlodipine,  bisoprolol-hydrochlorothiazide, calcium citrate, desipramine, gabapentin, rosuvastatin, and testosterone.  Pharmacotherapy (Medications Ordered): Meds ordered this encounter  Medications  . traMADol (ULTRAM) 50 MG tablet    Sig: Take 1-2 tablets (50-100 mg total) by mouth every 6 (six) hours as needed for severe pain. For chronic pain syndrome    Dispense:  150 tablet    Refill:  1    Fill one day early if pharmacy is closed on scheduled refill date. Do not fill until: To last until:  . gabapentin (NEURONTIN) 300 MG capsule    Sig: Take 1 capsule (300 mg total) by mouth 2 (two) times daily.    Dispense:  60 capsule    Refill:  1   Consider C2-C3 medial branch nerve block in future if pain does not improve with titration of gabapentin, tramadol and time.   Follow-up plan:   Return in about 8 weeks (around 07/13/2020) for Medication Management, in person.      Interventional management options:  Considering:   C2-C3 medial branch nerve block Lumbar epidural steroid injection Lumbar facet medial branch nerve blocks S1-S3 lateral branch nerve blocks SI joint injection Sprint peripheral nerve stimulation   PRN Procedures:   None at this time     Recent Visits Date Type Provider Dept  02/24/20 Office Visit Gillis Santa, MD [facility]  Showing recent visits within past 90 days and meeting all other requirements Today's Visits Date Type  Provider Dept  05/18/20 Office Visit Gillis Santa, MD [facility]  Showing today's visits and meeting all other requirements Future Appointments Date Type Provider Dept  07/13/20 Appointment Gillis Santa, MD [facility]  Showing future appointments within next 90 days and meeting all other requirements  I discussed the assessment and treatment plan with the patient. The patient was provided an opportunity to ask questions and all were answered. The patient agreed with the plan and demonstrated an understanding of  the instructions.  Patient advised to call back or seek an in-person evaluation if the symptoms or condition worsens.  Duration of encounter: 45mnutes.  Note by: BGillis Santa MD Date: 05/18/2020; Time: 11:32 AM

## 2020-07-13 ENCOUNTER — Encounter: Payer: 59 | Admitting: Student in an Organized Health Care Education/Training Program

## 2020-07-15 ENCOUNTER — Ambulatory Visit
Payer: Medicare Other | Attending: Student in an Organized Health Care Education/Training Program | Admitting: Student in an Organized Health Care Education/Training Program

## 2020-07-15 ENCOUNTER — Encounter: Payer: Self-pay | Admitting: Student in an Organized Health Care Education/Training Program

## 2020-07-15 ENCOUNTER — Other Ambulatory Visit: Payer: Self-pay

## 2020-07-15 VITALS — BP 117/67 | HR 84 | Temp 97.5°F | Resp 16 | Ht 66.0 in | Wt 152.0 lb

## 2020-07-15 DIAGNOSIS — M48062 Spinal stenosis, lumbar region with neurogenic claudication: Secondary | ICD-10-CM | POA: Diagnosis present

## 2020-07-15 DIAGNOSIS — G894 Chronic pain syndrome: Secondary | ICD-10-CM | POA: Insufficient documentation

## 2020-07-15 DIAGNOSIS — S12101S Unspecified nondisplaced fracture of second cervical vertebra, sequela: Secondary | ICD-10-CM | POA: Insufficient documentation

## 2020-07-15 DIAGNOSIS — G8929 Other chronic pain: Secondary | ICD-10-CM

## 2020-07-15 DIAGNOSIS — S134XXS Sprain of ligaments of cervical spine, sequela: Secondary | ICD-10-CM | POA: Insufficient documentation

## 2020-07-15 DIAGNOSIS — M5416 Radiculopathy, lumbar region: Secondary | ICD-10-CM | POA: Diagnosis present

## 2020-07-15 DIAGNOSIS — M47816 Spondylosis without myelopathy or radiculopathy, lumbar region: Secondary | ICD-10-CM | POA: Diagnosis present

## 2020-07-15 MED ORDER — GABAPENTIN 300 MG PO CAPS: 300.0000 mg | ORAL_CAPSULE | Freq: Two times a day (BID) | ORAL | 2 refills | Status: DC

## 2020-07-15 MED ORDER — TRAMADOL HCL 50 MG PO TABS: 50.0000 mg | ORAL_TABLET | Freq: Four times a day (QID) | ORAL | 2 refills | Status: AC | PRN

## 2020-07-15 NOTE — Progress Notes (Signed)
Nursing Pain Medication Assessment:  Safety precautions to be maintained throughout the outpatient stay will include: orient to surroundings, keep bed in low position, maintain call bell within reach at all times, provide assistance with transfer out of bed and ambulation.  Medication Inspection Compliance: Pill count conducted under aseptic conditions, in front of the patient. Neither the pills nor the bottle was removed from the patient's sight at any time. Once count was completed pills were immediately returned to the patient in their original bottle.  Medication: Tramadol (Ultram) Pill/Patch Count:  56 of 150 pills remain Pill/Patch Appearance: Markings consistent with prescribed medication Bottle Appearance: Standard pharmacy container. Clearly labeled. Filled Date: 06 / 11 / 2022 Last Medication intake:  Today

## 2020-07-15 NOTE — Progress Notes (Signed)
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 there is apparent fusion across the disc spaces at C2-C3, C3-C4 and C4-C5. Facet joint ankylosis is present on the left at C2-C3, bilaterally at C3-C4, and on the left at C4-C5.  Soft tissues and spinal canal: No prevertebral fluid or swelling. No visible canal hematoma.  Disc levels: Cervical spondylosis with multilevel disc space narrowing, disc bulges, uncovertebral hypertrophy and facet arthrosis. No appreciable high-grade spinal canal stenosis. Multilevel bony neural foraminal narrowing.  Upper chest: No consolidation within the imaged lung apices. No visible pneumothorax.  CT cervical spine impression #1 called by telephone at the time of interpretation on 05/14/2020 at 5:58 pm  to provider [facility] , who verbally acknowledged these results.  IMPRESSION: CT head:  1. No evidence of acute intracranial abnormality. 2. Mild cerebral atrophy and chronic small vessel ischemic disease.  CT maxillofacial:  1. No acute maxillofacial fracture is identified. 2. Punctate hyperdensity along the anterolateral aspect of the left globe. This may reflect a calcification. However, correlate with physical exam findings to exclude an imbedded foreign body at this site. 3. Left periorbital soft tissue swelling/hematoma.  CT cervical spine:  1. Acute comminuted and displaced fracture of the C2 right lateral mass and transverse process/foramen transversarium. Bony fragments encroach upon the right C2 foramen transversarium. A CTA of the neck is recommended to exclude right vertebral artery injury at this site. 2. 2 mm C6-C7 grade 1 anterolisthesis. 3. Trace C7-T1 grade 1 anterolisthesis. 4. Multilevel cervical spondylosis and fusion, as described.  Electronically Signed   By: Kellie Simmering DO   On: 05/14/2020 17:59 Note: Reviewed        Physical Exam  General appearance: Well nourished, well developed, and well hydrated. In no apparent acute distress Mental status: Alert, oriented x 3 (person, place, & time)       Respiratory: No evidence of acute respiratory distress Eyes: PERLA Vitals: BP 117/67   Pulse 84   Temp (!) 97.5 F (36.4 C)   Resp 16   Ht 5' 6"  (1.676 m)   Wt 152 lb (68.9 kg)   SpO2 97%   BMI 24.53 kg/m  BMI: Estimated body mass index is 24.53 kg/m as calculated from the following:   Height as of this encounter: 5' 6"  (1.676 m).   Weight as of this encounter: 152 lb (68.9 kg). Ideal: Ideal body weight: 63.8 kg (140 lb 10.5 oz) Adjusted ideal body weight: 65.9 kg (145 lb 3.1 oz)  Cervical Spine Area Exam  Skin & Axial Inspection:  C-collar in place Alignment: Symmetrical Functional ROM: Mechanically restricted ROM, bilaterally Stability:  No instability detected Muscle Tone/Strength: Functionally intact. No obvious neuro-muscular anomalies detected. Sensory (Neurological): Neurogenic pain pattern and musculoskeletal Palpation: Complains of area being  tender to palpation             Upper Extremity (UE) Exam    Side: Right upper extremity  Side: Left upper extremity  Skin & Extremity Inspection: Skin color, temperature, and hair growth are WNL. No peripheral edema or cyanosis. No masses, redness, swelling, asymmetry, or associated skin lesions. No contractures.  Skin & Extremity Inspection: Skin color, temperature, and hair growth are WNL. No peripheral edema or cyanosis. No masses, redness, swelling, asymmetry, or associated skin lesions. No contractures.  Functional ROM: Unrestricted ROM          Functional ROM: Unrestricted ROM          Muscle Tone/Strength: Functionally intact. No obvious neuro-muscular anomalies detected.  Muscle Tone/Strength: Functionally intact. No obvious neuro-muscular anomalies detected.  Sensory (Neurological): Musculoskeletal pain pattern          Sensory (Neurological): Musculoskeletal pain pattern          Palpation: No palpable anomalies              Palpation: No palpable anomalies              Provocative Test(s):  Phalen's test: deferred Tinel's test: deferred Apley's scratch test (touch opposite shoulder):  Action 1 (Across chest): Decreased ROM Action 2 (Overhead): Decreased ROM Action 3 (LB reach): Decreased ROM   Provocative Test(s):  Phalen's test: deferred Tinel's test: deferred Apley's scratch test (touch opposite shoulder):  Action 1 (Across chest): Decreased ROM Action 2 (Overhead): Decreased ROM Action 3 (LB reach): Decreased ROM     Assessment   Diagnosis  1. Whiplash injury to neck, sequela   2. Chronic pain syndrome   3. Chronic radicular lumbar pain   4. Lumbar spondylosis   5. Closed nondisplaced fracture of second cervical vertebra, unspecified fracture morphology,  sequela   6. Lumbar facet arthropathy   7. Spinal stenosis, lumbar region, with neurogenic claudication        Plan of Care   Mr. Naseer Hearn has a current medication list which includes the following long-term medication(s): amlodipine, bisoprolol-hydrochlorothiazide, calcium citrate, desipramine, rosuvastatin, testosterone, testosterone, and gabapentin.  Pharmacotherapy (Medications Ordered): Meds ordered this encounter  Medications   traMADol (ULTRAM) 50 MG tablet    Sig: Take 1 tablet (50 mg total) by mouth every 6 (six) hours as needed for severe pain. For chronic pain syndrome    Dispense:  120 tablet    Refill:  2   gabapentin (NEURONTIN) 300 MG capsule    Sig: Take 1 capsule (300 mg total) by mouth 2 (two) times daily.    Dispense:  60 capsule    Refill:  2    Consider C2-C3 medial branch nerve block in future if pain worsens   Follow-up plan:   Return in about 3 months (around 10/15/2020) for Medication Management, in person.      Interventional management options:  Considering:   C2-C3 medial branch nerve block Lumbar epidural steroid injection Lumbar facet medial branch nerve blocks S1-S3 lateral branch nerve blocks SI joint injection Sprint peripheral nerve stimulation   PRN Procedures:   None at this time     Recent Visits Date Type Provider Dept  05/18/20 Office Visit Gillis Santa, MD [facility]  Showing recent visits within past 90 days and meeting all other requirements Today's Visits Date Type Provider Dept  07/15/20 Office Visit Gillis Santa, MD [facility]  Showing today's visits and meeting all other requirements Future Appointments No visits  were found meeting these conditions. Showing future appointments within next 90 days and meeting all other requirements I discussed the assessment and treatment plan with the patient. The patient was provided an opportunity to ask questions and all were answered. The patient agreed  with the plan and demonstrated an understanding of the instructions.  Patient advised to call back or seek an in-person evaluation if the symptoms or condition worsens.  Duration of encounter: 10mnutes.  Note by: BGillis Santa MD Date: 07/15/2020; Time: 12:47 PM

## 2020-09-14 ENCOUNTER — Other Ambulatory Visit: Payer: Self-pay | Admitting: Student in an Organized Health Care Education/Training Program

## 2020-09-14 DIAGNOSIS — M5416 Radiculopathy, lumbar region: Secondary | ICD-10-CM

## 2020-09-14 DIAGNOSIS — M48062 Spinal stenosis, lumbar region with neurogenic claudication: Secondary | ICD-10-CM

## 2020-09-14 DIAGNOSIS — G894 Chronic pain syndrome: Secondary | ICD-10-CM

## 2020-09-14 DIAGNOSIS — G8929 Other chronic pain: Secondary | ICD-10-CM

## 2020-10-14 ENCOUNTER — Ambulatory Visit
Payer: Medicare Other | Attending: Student in an Organized Health Care Education/Training Program | Admitting: Student in an Organized Health Care Education/Training Program

## 2020-10-14 ENCOUNTER — Other Ambulatory Visit: Payer: Self-pay

## 2020-10-14 ENCOUNTER — Encounter: Payer: Self-pay | Admitting: Student in an Organized Health Care Education/Training Program

## 2020-10-14 VITALS — BP 112/75 | HR 82 | Temp 97.2°F | Resp 15 | Ht 66.0 in | Wt 150.0 lb

## 2020-10-14 DIAGNOSIS — S12101S Unspecified nondisplaced fracture of second cervical vertebra, sequela: Secondary | ICD-10-CM | POA: Diagnosis present

## 2020-10-14 DIAGNOSIS — M5416 Radiculopathy, lumbar region: Secondary | ICD-10-CM | POA: Diagnosis present

## 2020-10-14 DIAGNOSIS — M47816 Spondylosis without myelopathy or radiculopathy, lumbar region: Secondary | ICD-10-CM | POA: Diagnosis present

## 2020-10-14 DIAGNOSIS — M48062 Spinal stenosis, lumbar region with neurogenic claudication: Secondary | ICD-10-CM | POA: Insufficient documentation

## 2020-10-14 DIAGNOSIS — G894 Chronic pain syndrome: Secondary | ICD-10-CM | POA: Diagnosis present

## 2020-10-14 DIAGNOSIS — G8929 Other chronic pain: Secondary | ICD-10-CM | POA: Insufficient documentation

## 2020-10-14 DIAGNOSIS — S134XXS Sprain of ligaments of cervical spine, sequela: Secondary | ICD-10-CM | POA: Insufficient documentation

## 2020-10-14 MED ORDER — TRAMADOL HCL 50 MG PO TABS: 50.0000 mg | ORAL_TABLET | Freq: Four times a day (QID) | ORAL | 3 refills | Status: DC | PRN

## 2020-10-14 MED ORDER — GABAPENTIN 300 MG PO CAPS
300.0000 mg | ORAL_CAPSULE | Freq: Two times a day (BID) | ORAL | 3 refills | Status: DC
Start: 2020-10-14 — End: 2021-02-10

## 2020-10-14 NOTE — Progress Notes (Signed)
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 calcification. However, correlate with physical exam findings to exclude an imbedded foreign body at this site. 3. Left periorbital soft tissue swelling/hematoma.  CT cervical spine:  1. Acute comminuted and displaced fracture of the C2 right lateral mass and transverse process/foramen transversarium. Bony fragments encroach upon the right C2 foramen transversarium. A CTA of the neck is recommended to exclude right vertebral artery injury at this site. 2. 2 mm C6-C7 grade 1 anterolisthesis. 3. Trace C7-T1 grade 1 anterolisthesis. 4. Multilevel cervical spondylosis and fusion, as described.  Electronically Signed   By: Kellie Simmering DO   On: 05/14/2020 17:59 Note: Reviewed        Physical Exam  General  appearance: Well nourished, well developed, and well hydrated. In no apparent acute distress Mental status: Alert, oriented x 3 (person, place, & time)       Respiratory: No evidence of acute respiratory distress Eyes: PERLA Vitals: BP 112/75   Pulse 82   Temp (!) 97.2 F (36.2 C) (Temporal)   Resp 15   Ht 5' 6"  (1.676 m)   Wt 150 lb (68 kg)   SpO2 96%   BMI 24.21 kg/m  BMI: Estimated body mass index is 24.21 kg/m as calculated from the following:   Height as of this encounter: 5' 6"  (1.676 m).   Weight as of this encounter: 150 lb (68 kg). Ideal: Ideal body weight: 63.8 kg (140 lb 10.5 oz) Adjusted ideal body weight: 65.5 kg (144 lb 6.3 oz)  Cervical Spine Area Exam   Functional ROM: Mechanically restricted ROM, bilaterally Stability: No instability detected Muscle Tone/Strength: Functionally intact. No obvious neuro-muscular anomalies detected. Sensory (Neurological): Neurogenic pain pattern and musculoskeletal Palpation: Complains of area being tender to palpation             Upper Extremity (UE) Exam    Side: Right upper extremity  Side: Left upper extremity  Skin & Extremity Inspection: Skin color, temperature, and hair growth are WNL. No peripheral edema or cyanosis. No masses, redness, swelling, asymmetry, or associated skin lesions. No contractures.  Skin & Extremity Inspection: Skin color, temperature, and hair growth are WNL. No peripheral edema or cyanosis. No masses, redness, swelling, asymmetry, or associated skin lesions. No contractures.  Functional ROM: Unrestricted ROM          Functional ROM: Unrestricted ROM          Muscle Tone/Strength: Functionally intact. No obvious neuro-muscular anomalies detected.  Muscle Tone/Strength: Functionally intact. No obvious neuro-muscular anomalies detected.  Sensory (Neurological): Musculoskeletal pain pattern          Sensory (Neurological): Musculoskeletal pain pattern          Palpation: No palpable anomalies  Palpation: No palpable anomalies              Provocative Test(s):  Phalen's test: deferred Tinel's test: deferred Apley's scratch test (touch opposite shoulder):  Action 1 (Across chest): Decreased ROM Action 2 (Overhead): Decreased ROM Action 3 (LB reach): Decreased ROM   Provocative Test(s):  Phalen's test: deferred Tinel's test: deferred Apley's scratch test (touch opposite shoulder):  Action 1 (Across chest): Decreased ROM Action 2 (Overhead): Decreased ROM Action 3 (LB reach): Decreased ROM     Assessment   Diagnosis  1. Chronic pain syndrome   2. Chronic radicular lumbar pain   3. Spinal stenosis, lumbar region, with neurogenic claudication   4. Whiplash injury to neck, sequela   5. Lumbar spondylosis   6. Closed nondisplaced fracture of second cervical vertebra, unspecified fracture morphology, sequela         Plan of Care   Mr. Klayton Fernandez has a current medication list which includes the following long-term medication(s): amlodipine, bisoprolol-hydrochlorothiazide, calcium citrate, desipramine, rosuvastatin, testosterone, testosterone, and gabapentin.  Pharmacotherapy (Medications Ordered): Meds ordered this encounter  Medications   traMADol (ULTRAM) 50 MG tablet    Sig: Take 1 tablet (50 mg total) by mouth every 6 (six) hours as needed.    Dispense:  120 tablet    Refill:  3   gabapentin (NEURONTIN) 300 MG capsule    Sig: Take 1 capsule (300 mg total) by mouth 2 (two) times daily.    Dispense:  60 capsule    Refill:  3   Consider C2-C3 medial branch nerve block in future if pain worsens Continue with PT  Follow-up plan:   Return in about 4 months (around 02/14/2021) for Medication Management, in person.      Interventional management options:  Considering:   C2-C3 medial branch nerve block Lumbar epidural steroid injection Lumbar facet medial branch nerve blocks S1-S3 lateral branch nerve blocks SI joint injection Sprint peripheral nerve  stimulation   PRN Procedures:   None at this time     Recent Visits No visits were found meeting these conditions. Showing recent visits within past 90 days and meeting all other requirements Today's Visits Date Type Provider Dept  10/14/20 Office Visit Gillis Santa, MD [facility]  Showing today's visits and meeting all other requirements Future Appointments No visits were found meeting these conditions. Showing future appointments within next 90 days and meeting all other requirements I discussed the assessment and treatment plan with the patient. The patient was provided an opportunity to ask questions and all were answered. The patient agreed with the plan and demonstrated an understanding of the instructions.  Patient advised to call back or seek an in-person evaluation if the symptoms or condition worsens.  Duration of encounter: 9mnutes.  Note by: BGillis Santa MD Date: 10/14/2020; Time: 11:50 AM

## 2020-10-14 NOTE — Progress Notes (Signed)
Nursing Pain Medication Assessment:  Safety precautions to be maintained throughout the outpatient stay will include: orient to surroundings, keep bed in low position, maintain call bell within reach at all times, provide assistance with transfer out of bed and ambulation.  Medication Inspection Compliance: Pill count conducted under aseptic conditions, in front of the patient. Neither the pills nor the bottle was removed from the patient's sight at any time. Once count was completed pills were immediately returned to the patient in their original bottle.  Medication: Tramadol (Ultram) Pill/Patch Count:  07 of 120 pills remain Pill/Patch Appearance: Markings consistent with prescribed medication Bottle Appearance: Standard pharmacy container. Clearly labeled. Filled Date: 09 / 06 / 2022 Last Medication intake:  Today Safety precautions to be maintained throughout the outpatient stay will include: orient to surroundings, keep bed in low position, maintain call bell within reach at all times, provide assistance with transfer out of bed and ambulation.

## 2021-02-10 ENCOUNTER — Other Ambulatory Visit: Payer: Self-pay

## 2021-02-10 ENCOUNTER — Encounter: Payer: Self-pay | Admitting: Student in an Organized Health Care Education/Training Program

## 2021-02-10 ENCOUNTER — Ambulatory Visit
Payer: Medicare Other | Attending: Student in an Organized Health Care Education/Training Program | Admitting: Student in an Organized Health Care Education/Training Program

## 2021-02-10 VITALS — BP 108/69 | HR 81 | Temp 97.3°F | Resp 15 | Ht 65.0 in | Wt 151.0 lb

## 2021-02-10 DIAGNOSIS — G894 Chronic pain syndrome: Secondary | ICD-10-CM | POA: Diagnosis present

## 2021-02-10 DIAGNOSIS — M47816 Spondylosis without myelopathy or radiculopathy, lumbar region: Secondary | ICD-10-CM

## 2021-02-10 DIAGNOSIS — S134XXS Sprain of ligaments of cervical spine, sequela: Secondary | ICD-10-CM

## 2021-02-10 DIAGNOSIS — M48062 Spinal stenosis, lumbar region with neurogenic claudication: Secondary | ICD-10-CM

## 2021-02-10 DIAGNOSIS — M5416 Radiculopathy, lumbar region: Secondary | ICD-10-CM

## 2021-02-10 DIAGNOSIS — Z96653 Presence of artificial knee joint, bilateral: Secondary | ICD-10-CM | POA: Diagnosis present

## 2021-02-10 DIAGNOSIS — S12101S Unspecified nondisplaced fracture of second cervical vertebra, sequela: Secondary | ICD-10-CM | POA: Diagnosis present

## 2021-02-10 DIAGNOSIS — G8929 Other chronic pain: Secondary | ICD-10-CM

## 2021-02-10 MED ORDER — TRAMADOL HCL 50 MG PO TABS: 50.0000 mg | ORAL_TABLET | Freq: Four times a day (QID) | ORAL | 3 refills | Status: AC | PRN

## 2021-02-10 NOTE — Progress Notes (Signed)
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 and on the left at C4-C5.  Soft tissues and spinal canal: No prevertebral fluid or swelling. No visible canal hematoma.  Disc levels: Cervical spondylosis with multilevel disc space narrowing, disc bulges, uncovertebral hypertrophy and facet arthrosis. No appreciable high-grade spinal canal stenosis. Multilevel bony neural foraminal narrowing.  Upper chest: No consolidation within the imaged lung apices. No visible pneumothorax.  CT cervical spine impression #1 called by telephone at the time of interpretation on 05/14/2020 at 5:58 pm to provider [facility] , who verbally acknowledged these results.  IMPRESSION: CT head:  1. No evidence of acute intracranial abnormality. 2. Mild cerebral atrophy and chronic small vessel ischemic disease.  CT maxillofacial:  1. No  acute maxillofacial fracture is identified. 2. Punctate hyperdensity along the anterolateral aspect of the left globe. This may reflect a calcification. However, correlate with physical exam findings to exclude an imbedded foreign body at this site. 3. Left periorbital soft tissue swelling/hematoma.  CT cervical spine:  1. Acute comminuted and displaced fracture of the C2 right lateral mass and transverse process/foramen transversarium. Bony fragments encroach upon the right C2 foramen transversarium. A CTA of the neck is recommended to exclude right vertebral artery injury at this site. 2. 2 mm C6-C7 grade 1 anterolisthesis. 3. Trace C7-T1 grade 1 anterolisthesis. 4. Multilevel cervical spondylosis and fusion, as described.  Electronically Signed   By: Kellie Simmering DO   On: 05/14/2020 17:59 Note: Reviewed        Physical Exam  General appearance: Well nourished, well developed, and well hydrated. In no apparent acute distress Mental status: Alert, oriented x 3 (person, place, & time)       Respiratory: No evidence of acute respiratory distress Eyes: PERLA Vitals: BP 108/69    Pulse 81    Temp (!) 97.3 F (36.3 C) (Temporal)    Resp 15    Ht 5' 5"  (1.651 m)    Wt 151 lb (68.5 kg)    SpO2 99%    BMI 25.13 kg/m  BMI: Estimated body mass index is 25.13 kg/m as calculated from the following:   Height as of this encounter: 5' 5"  (1.651 m).   Weight as of this encounter: 151 lb (68.5 kg). Ideal: Ideal body weight: 61.5 kg (135 lb 9.3 oz) Adjusted ideal body weight: 64.3 kg (141 lb 12 oz)    Cervical Spine Area Exam    Functional ROM: Mechanically restricted ROM, bilaterally Stability: No instability detected Muscle Tone/Strength: Functionally intact. No obvious neuro-muscular anomalies detected. Sensory (Neurological): Neurogenic pain pattern and musculoskeletal Palpation: Complains of area being tender to palpation             Upper Extremity (UE) Exam      Side: Right upper  extremity   Side: Left upper extremity  Skin & Extremity Inspection: Skin color, temperature, and hair growth are WNL.  No peripheral edema or cyanosis. No masses, redness, swelling, asymmetry, or associated skin lesions. No contractures.   Skin & Extremity Inspection: Skin color, temperature, and hair growth are WNL. No peripheral edema or cyanosis. No masses, redness, swelling, asymmetry, or associated skin lesions. No contractures.  Functional ROM: Unrestricted ROM           Functional ROM: Unrestricted ROM          Muscle Tone/Strength: Functionally intact. No obvious neuro-muscular anomalies detected.   Muscle Tone/Strength: Functionally intact. No obvious neuro-muscular anomalies detected.  Sensory (Neurological): Musculoskeletal pain pattern           Sensory (Neurological): Musculoskeletal pain pattern          Palpation: No palpable anomalies               Palpation: No palpable anomalies              Provocative Test(s):  Phalen's test: deferred Tinel's test: deferred Apley's scratch test (touch opposite shoulder):  Action 1 (Across chest): Decreased ROM Action 2 (Overhead): Decreased ROM Action 3 (LB reach): Decreased ROM     Provocative Test(s):  Phalen's test: deferred Tinel's test: deferred Apley's scratch test (touch opposite shoulder):  Action 1 (Across chest): Decreased ROM Action 2 (Overhead): Decreased ROM Action 3 (LB reach): Decreased ROM    Assessment   Status Diagnosis  Controlled Controlled Controlled 1. Whiplash injury to neck, sequela   2. Chronic pain syndrome   3. Chronic radicular lumbar pain   4. Spinal stenosis, lumbar region, with neurogenic claudication   5. Lumbar spondylosis   6. Closed nondisplaced fracture of second cervical vertebra, unspecified fracture morphology, sequela   7. Lumbar facet arthropathy   8. History of bilateral knee replacement      Updated Problems: Problem  Whiplash Injury to Neck  Closed Nondisplaced Fracture of Second  Cervical Vertebra ([facility])  Pain Management Contract Signed  Chronic Si Joint Pain  Status Post Hip Replacement, Bilateral  History of Bilateral Knee Replacement  Spinal Stenosis, Lumbar Region, With Neurogenic Claudication  Chronic Radicular Lumbar Pain  Lumbar Spondylosis  Chronic Pain Syndrome    Plan of Care    Zachary Fernandez has a current medication list which includes the following long-term medication(s): amlodipine, bisoprolol-hydrochlorothiazide, calcium citrate, desipramine, rosuvastatin, testosterone, and testosterone.  Pharmacotherapy (Medications Ordered): Meds ordered this encounter  Medications   traMADol (ULTRAM) 50 MG tablet    Sig: Take 1 tablet (50 mg total) by mouth every 6 (six) hours as needed.    Dispense:  120 tablet    Refill:  3   Continue with chiropractic treatment.  Have discussed occipital nerve block versus C2-C3 medial branch nerve block.  Patient will let us know if he is interested in pursuing this.  Otherwise follow-up in 4 months for medication management.  Follow-up plan:   Return in about 4 months (around 06/10/2021) for Medication Management, in person.     Interventional management options:  Considering: Occipital nerve block   C2-C3 medial branch nerve block Lumbar epidural steroid injection Lumbar facet medial branch nerve blocks S1-S3 lateral branch nerve blocks SI joint injection Sprint peripheral nerve stimulation   PRN Procedures:   None at this time      Recent Visits No visits were found meeting these conditions. Showing recent visits within past 90 days and meeting all other requirements Today's Visits Date Type Provider Dept  02/10/21 Office Visit Gillis Santa, MD [facility]  Showing  today's visits and meeting all other requirements Future Appointments No visits were found meeting these conditions. Showing future appointments within next 90 days and meeting all other requirements  I discussed the  assessment and treatment plan with the patient. The patient was provided an opportunity to ask questions and all were answered. The patient agreed with the plan and demonstrated an understanding of the instructions.  Patient advised to call back or seek an in-person evaluation if the symptoms or condition worsens.  Duration of encounter: 30 minutes.  Note by: Gillis Santa, MD Date: 02/10/2021; Time: 1:11 PM

## 2021-02-10 NOTE — Progress Notes (Signed)
Nursing Pain Medication Assessment:  Safety precautions to be maintained throughout the outpatient stay will include: orient to surroundings, keep bed in low position, maintain call bell within reach at all times, provide assistance with transfer out of bed and ambulation.  Medication Inspection Compliance: Zachary Fernandez did not comply with our request to bring his pills to be counted. He was reminded that bringing the medication bottles, even when empty, is a requirement.  Medication: None brought in. Pill/Patch Count: None available to be counted. Bottle Appearance: No container available. Did not bring bottle(s) to appointment. Filled Date: N/A Last Medication intake:  Today 

## 2021-03-24 ENCOUNTER — Other Ambulatory Visit: Payer: Self-pay | Admitting: Student in an Organized Health Care Education/Training Program

## 2021-03-28 ENCOUNTER — Telehealth: Payer: Self-pay | Admitting: Student in an Organized Health Care Education/Training Program

## 2021-03-28 NOTE — Telephone Encounter (Signed)
Spoke with [facility] and they have the prescritpion and will get it ready for pickup.  Patinet notified.  ?

## 2021-04-07 DIAGNOSIS — R7989 Other specified abnormal findings of blood chemistry: Secondary | ICD-10-CM | POA: Insufficient documentation

## 2021-04-07 DIAGNOSIS — Z860101 Personal history of adenomatous and serrated colon polyps: Secondary | ICD-10-CM | POA: Insufficient documentation

## 2021-04-07 DIAGNOSIS — R140 Abdominal distension (gaseous): Secondary | ICD-10-CM | POA: Insufficient documentation

## 2021-04-20 DIAGNOSIS — Z8719 Personal history of other diseases of the digestive system: Secondary | ICD-10-CM | POA: Insufficient documentation

## 2021-05-31 ENCOUNTER — Encounter: Payer: TRICARE For Life (TFL) | Admitting: Student in an Organized Health Care Education/Training Program

## 2021-08-01 ENCOUNTER — Telehealth: Payer: Self-pay | Admitting: Student in an Organized Health Care Education/Training Program

## 2021-08-01 DIAGNOSIS — M47816 Spondylosis without myelopathy or radiculopathy, lumbar region: Secondary | ICD-10-CM | POA: Insufficient documentation

## 2021-08-01 DIAGNOSIS — M533 Sacrococcygeal disorders, not elsewhere classified: Secondary | ICD-10-CM | POA: Insufficient documentation

## 2021-08-01 DIAGNOSIS — M5416 Radiculopathy, lumbar region: Secondary | ICD-10-CM | POA: Insufficient documentation

## 2021-08-01 DIAGNOSIS — M545 Low back pain, unspecified: Secondary | ICD-10-CM | POA: Insufficient documentation

## 2021-08-01 DIAGNOSIS — M4316 Spondylolisthesis, lumbar region: Secondary | ICD-10-CM | POA: Insufficient documentation

## 2021-08-01 NOTE — Telephone Encounter (Signed)
Patient fall yesterday and didn't go to the ER. Patient wanted to notify doctor Lateef. PT has an appt today at 12pm at [facility]. Please give patient a call. Thanks

## 2021-08-01 NOTE — Telephone Encounter (Signed)
Advised to schedule appt. Attempted to transfer call to secretaries, they are currently unavailable. Advised patient to call the office to schedule appt.

## 2021-08-02 DIAGNOSIS — G8929 Other chronic pain: Secondary | ICD-10-CM | POA: Insufficient documentation

## 2021-08-08 ENCOUNTER — Ambulatory Visit
Payer: Medicare Other | Attending: Student in an Organized Health Care Education/Training Program | Admitting: Student in an Organized Health Care Education/Training Program

## 2021-08-08 ENCOUNTER — Encounter: Payer: Self-pay | Admitting: Student in an Organized Health Care Education/Training Program

## 2021-08-08 VITALS — BP 115/56 | HR 5 | Temp 98.2°F | Resp 16 | Ht 66.0 in | Wt 153.0 lb

## 2021-08-08 DIAGNOSIS — G894 Chronic pain syndrome: Secondary | ICD-10-CM | POA: Insufficient documentation

## 2021-08-08 DIAGNOSIS — M5416 Radiculopathy, lumbar region: Secondary | ICD-10-CM | POA: Diagnosis present

## 2021-08-08 DIAGNOSIS — M48062 Spinal stenosis, lumbar region with neurogenic claudication: Secondary | ICD-10-CM | POA: Insufficient documentation

## 2021-08-08 DIAGNOSIS — G8929 Other chronic pain: Secondary | ICD-10-CM | POA: Diagnosis present

## 2021-08-08 NOTE — Patient Instructions (Signed)

## 2021-08-08 NOTE — Progress Notes (Signed)
Nursing Pain Medication Assessment:  Safety precautions to be maintained throughout the outpatient stay will include: orient to surroundings, keep bed in low position, maintain call bell within reach at all times, provide assistance with transfer out of bed and ambulation.  Medication Inspection Compliance: Mr. Aliberti did not comply with our request to bring his pills to be counted. He was reminded that bringing the medication bottles, even when empty, is a requirement.  Medication: None brought in. Pill/Patch Count: None available to be counted. Bottle Appearance: No container available. Did not bring bottle(s) to appointment. Filled Date: N/A Last Medication intake:  Today

## 2021-08-08 NOTE — Progress Notes (Signed)
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 fragments encroach upon the right C2 foramen transversarium (series 2, image 36). No other acute cervical spine fracture is identified. Bulky multilevel bridging ventral osteophytes. Additionally, there is apparent fusion across the disc spaces at C2-C3, C3-C4 and C4-C5. Facet joint ankylosis is present on the left at C2-C3, bilaterally at C3-C4, and on the left at C4-C5.  Soft tissues and spinal canal: No prevertebral fluid or swelling. No visible canal hematoma.  Disc levels: Cervical spondylosis with multilevel disc space narrowing, disc bulges, uncovertebral hypertrophy and facet arthrosis. No appreciable high-grade spinal canal stenosis. Multilevel bony  neural foraminal narrowing.  Upper chest: No consolidation within the imaged lung apices. No visible pneumothorax.  CT cervical spine impression #1 called by telephone at the time of interpretation on 05/14/2020 at 5:58 pm to provider [facility] , who verbally acknowledged these results.  IMPRESSION: CT head:  1. No evidence of acute intracranial abnormality. 2. Mild cerebral atrophy and chronic small vessel ischemic disease.  CT maxillofacial:  1. No acute maxillofacial fracture is identified. 2. Punctate hyperdensity along the anterolateral aspect of the left globe. This may reflect a calcification. However, correlate with physical exam findings to exclude an imbedded foreign body at this site. 3. Left periorbital soft tissue swelling/hematoma.  CT cervical spine:  1. Acute comminuted and displaced fracture of the C2 right lateral mass and transverse process/foramen transversarium. Bony fragments encroach upon the right C2 foramen transversarium. A CTA of the neck is recommended to exclude right vertebral artery injury at this site. 2. 2 mm C6-C7 grade 1 anterolisthesis. 3. Trace C7-T1 grade 1 anterolisthesis. 4. Multilevel cervical spondylosis and fusion, as described.  Electronically Signed   By: Kellie Simmering DO   On: 05/14/2020 17:59 Note: Reviewed        Physical Exam  General appearance: Well nourished, well developed, and well hydrated. In no apparent acute distress Mental status: Alert, oriented x 3 (person, place, & time)       Respiratory: No evidence of acute respiratory distress Eyes: PERLA Vitals: BP (!) 115/56   Pulse (!) 5   Temp 98.2 F (36.8 C) (Temporal)   Resp 16   Ht 5' 6"  (1.676 m)   Wt 153 lb (69.4 kg)   SpO2 99%   BMI 24.69 kg/m  BMI: Estimated body mass index is 24.69 kg/m as calculated from the following:   Height as of this encounter: 5' 6"  (1.676 m).   Weight as of this encounter: 153 lb (69.4 kg). Ideal: Ideal body weight: 63.8  kg (140 lb 10.5 oz) Adjusted ideal body weight: 66 kg (145 lb 9.5 oz)  Lumbar Spine Area  Exam  Skin & Axial Inspection: No masses, redness, or swelling Alignment: Symmetrical Functional ROM: Pain restricted ROM       Stability: No instability detected Muscle Tone/Strength: Functionally intact. No obvious neuro-muscular anomalies detected. Sensory (Neurological): Dermatomal pain pattern right L3-L4  Gait & Posture Assessment  Ambulation: Limited Gait: Antalgic gait (limping) Posture: Difficulty standing up straight, due to pain  Lower Extremity Exam    Side: Right lower extremity  Side: Left lower extremity  Stability: No instability observed          Stability: No instability observed          Skin & Extremity Inspection: Skin color, temperature, and hair growth are WNL. No peripheral edema or cyanosis. No masses, redness, swelling, asymmetry, or associated skin lesions. No contractures.  Skin & Extremity Inspection: Skin color, temperature, and hair growth are WNL. No peripheral edema or cyanosis. No masses, redness, swelling, asymmetry, or associated skin lesions. No contractures.  Functional ROM: Pain restricted ROM. Positive straight leg raise test on the right          Functional ROM: Unrestricted ROM                  Muscle Tone/Strength: Functionally intact. No obvious neuro-muscular anomalies detected.  Muscle Tone/Strength: Functionally intact. No obvious neuro-muscular anomalies detected.  Sensory (Neurological): Dermatomal pain pattern        Sensory (Neurological): Unimpaired        DTR: Patellar: deferred today Achilles: deferred today Plantar: deferred today  DTR: Patellar: deferred today Achilles: deferred today Plantar: deferred today  Palpation: No palpable anomalies  Palpation: No palpable anomalies    Assessment   Diagnosis Status  1. Chronic radicular lumbar pain   2. Spinal stenosis, lumbar region, with neurogenic claudication   3. Chronic pain syndrome     Having a Flare-up Having a Flare-up Having a Flare-up     Plan of Care  Lumbar radicular pain flare after sustaining a fall last week.  Lumbar spine x-rays do not show any acute fracture.  Patient requesting lumbar epidural steroid injection which she has been admitted from in the past when he had a similar flareup in [place].  Plan for lumbar epidural steroid injection without sedation. Orders:  Orders Placed This Encounter  Procedures   Lumbar Epidural Injection    Standing Status:   Future    Standing Expiration Date:   09/08/2021    Scheduling Instructions:     Lumbar/caudal ESI    Order Specific Question:   Where will this procedure be performed?    Answer:   [facility]   Follow-up plan:   Return in about 1 week (around 08/15/2021) for lumbar ESI , in clinic [facility].     Interventional management options:  Considering: Occipital nerve block   C2-C3 medial branch nerve block Lumbar epidural steroid injection Lumbar facet medial branch nerve blocks S1-S3 lateral branch nerve blocks SI joint injection Sprint peripheral nerve stimulation   PRN Procedures:   None at this time       Recent Visits No visits were found meeting these conditions. Showing recent visits within past 90 days and meeting all other requirements Today's Visits Date Type Provider Dept  08/08/21 Office Visit Gillis Santa, MD [facility]  Showing today's visits and meeting all other requirements Future Appointments Date Type Provider Dept  08/15/21 Appointment Gillis Santa, MD [facility]  Showing future appointments within next 90 days and meeting all other requirements  I discussed the assessment and treatment plan with the patient. The patient was provided an opportunity to ask questions and all were answered. The patient agreed with the plan and demonstrated an understanding of the instructions.  Patient advised to call back or seek an in-person evaluation if the  symptoms or condition worsens.  Duration of encounter: 47mnutes.  Total time on encounter, as per AMA guidelines included both the face-to-face and non-face-to-face time personally spent by the physician and/or other qualified health care professional(s) on the day of the encounter (includes time in activities that require the physician or other qualified health care professional and does not include time in activities normally performed by clinical staff). Physician's time may include the following activities when performed: preparing to see the patient (eg, review of tests, pre-charting review of records) obtaining and/or reviewing separately obtained history performing a medically appropriate examination and/or evaluation counseling and educating the patient/family/caregiver ordering medications, tests, or procedures referring and communicating with other health care professionals (when not separately reported) documenting clinical information in the electronic or other health record independently interpreting results (not separately reported) and communicating results to the patient/ family/caregiver care coordination (not separately reported)  Note by: BGillis Santa MD Date: 08/08/2021; Time: 3:06 PM

## 2021-08-15 ENCOUNTER — Ambulatory Visit: Payer: TRICARE For Life (TFL) | Admitting: Student in an Organized Health Care Education/Training Program

## 2021-08-22 ENCOUNTER — Encounter: Payer: Self-pay | Admitting: Student in an Organized Health Care Education/Training Program

## 2021-08-22 ENCOUNTER — Ambulatory Visit
Admission: RE | Admit: 2021-08-22 | Discharge: 2021-08-22 | Disposition: A | Discharge status: Home / Self Care | Payer: Medicare Other | Source: Ambulatory Visit | Attending: Student in an Organized Health Care Education/Training Program | Admitting: Student in an Organized Health Care Education/Training Program

## 2021-08-22 ENCOUNTER — Ambulatory Visit
Payer: Medicare Other | Attending: Student in an Organized Health Care Education/Training Program | Admitting: Student in an Organized Health Care Education/Training Program

## 2021-08-22 VITALS — BP 116/73 | HR 72 | Temp 98.1°F | Resp 24 | Ht 66.0 in | Wt 153.0 lb

## 2021-08-22 DIAGNOSIS — G894 Chronic pain syndrome: Secondary | ICD-10-CM | POA: Insufficient documentation

## 2021-08-22 DIAGNOSIS — G8929 Other chronic pain: Secondary | ICD-10-CM | POA: Diagnosis present

## 2021-08-22 DIAGNOSIS — M5416 Radiculopathy, lumbar region: Secondary | ICD-10-CM | POA: Insufficient documentation

## 2021-08-22 DIAGNOSIS — M48062 Spinal stenosis, lumbar region with neurogenic claudication: Secondary | ICD-10-CM | POA: Insufficient documentation

## 2021-08-22 MED ORDER — IOHEXOL 180 MG/ML  SOLN
10.0000 mL | Freq: Once | INTRAMUSCULAR | Status: AC
  Administered 2021-08-22: 10 mL via EPIDURAL
  Filled 2021-08-22: qty 20

## 2021-08-22 MED ORDER — SODIUM CHLORIDE 0.9% FLUSH
2.0000 mL | Freq: Once | INTRAVENOUS | Status: AC
  Administered 2021-08-22: 2 mL

## 2021-08-22 MED ORDER — DEXAMETHASONE SODIUM PHOSPHATE 10 MG/ML IJ SOLN
10.0000 mg | Freq: Once | INTRAMUSCULAR | Status: AC
  Administered 2021-08-22: 10 mg
  Filled 2021-08-22: qty 1

## 2021-08-22 MED ORDER — ROPIVACAINE HCL 2 MG/ML IJ SOLN
2.0000 mL | Freq: Once | INTRAMUSCULAR | Status: AC
  Administered 2021-08-22: 2 mL via EPIDURAL
  Filled 2021-08-22: qty 20

## 2021-08-22 MED ORDER — LIDOCAINE HCL 2 % IJ SOLN
20.0000 mL | Freq: Once | INTRAMUSCULAR | Status: AC
  Administered 2021-08-22: 400 mg
  Filled 2021-08-22: qty 20

## 2021-08-22 MED ORDER — SODIUM CHLORIDE (PF) 0.9 % IJ SOLN
INTRAMUSCULAR | Status: AC
  Filled 2021-08-22: qty 10

## 2021-08-22 NOTE — Patient Instructions (Signed)

## 2021-08-22 NOTE — Progress Notes (Signed)
PROVIDER NOTE: Interpretation of information contained herein should be left to medically-trained personnel. Specific patient instructions are provided elsewhere under "Patient Instructions" section of medical record. This document was created in part using STT-dictation technology, any transcriptional errors that may result from this process are unintentional.  Patient: Zachary Fernandez Type: Established DOB: 02-06-1940 MRN: [id number] PCP: Pcp, No  Service: Procedure DOS: 08/22/2021 Setting: Ambulatory Location: Ambulatory outpatient facility Delivery: Face-to-face Provider: Edward Jolly, MD Specialty: Interventional Pain Management Specialty designation: 09 Location: Outpatient facility Ref. Prov.: No ref. provider found    Interventional Therapy      Procedure: Caudal Epidural Steroid Injection        Laterality: Midline         Level: Sacrococcygeal ligament  Imaging: Fluoroscopy-guided         Anesthesia: Local anesthesia (1-2% Lidocaine) Anxiolysis: None                 Sedation:                .  DOS: 08/22/2021  Performed by: Edward Jolly, MD  Purpose: Diagnostic/Therapeutic Indications: Low back and lower extremity pain severe enough to impact quality of life or function. Rationale (medical necessity): procedure needed and proper for the diagnosis and/or treatment of Zachary Fernandez medical symptoms and needs. 1. Chronic radicular lumbar pain   2. Spinal stenosis, lumbar region, with neurogenic claudication   3. Chronic pain syndrome    NAS-11 Pain score:   Pre-procedure: 2 /10   Post-procedure: 0-No pain/10     Target: Lumbosacral epidural canal  Location: Epidural space  Region: Caudal canal Approach: Percutaneous  Type of procedure: Epidural block  Position  Prep  Materials:  Position: Prone  Prep solution: DuraPrep (Iodine Povacrylex [0.7% available iodine] and Isopropyl Alcohol, 74% w/w) Prep Area: Entire posterior lumbosacral area  Materials:  Tray:  Epidural Needle(s):  Type: Epidural  Gauge (G): 22g Length: 3.5-in  Qty: 1  Pre-op H&P Assessment:  Zachary Fernandez is a 81 y.o. (year old), male patient, seen today for interventional treatment. He  has a past surgical history that includes Revision total hip arthroplasty (Bilateral); Knee Arthroplasty (Left); Shoulder surgery (Left); and Cholecystectomy. Zachary Fernandez has a current medication list which includes the following prescription(s): amlodipine, barberry-oreg grape-goldenseal, bisoprolol-hydrochlorothiazide, calcium citrate, cyclobenzaprine, desipramine, meloxicam, multiple vitamins-minerals, rosuvastatin, tamsulosin, testosterone, testosterone, tramadol, and turmeric. His primarily concern today is the Back Pain (lower)  Initial Vital Signs:  Pulse/HCG Rate: 72ECG Heart Rate: 74 Temp: 98.1 F (36.7 C) Resp: 16 BP: 124/63 SpO2: 98 %  BMI: Estimated body mass index is 24.69 kg/m as calculated from the following:   Height as of this encounter: 5\' 6"  (1.676 m).   Weight as of this encounter: 153 lb (69.4 kg).  Risk Assessment: Allergies: Reviewed. He has No Known Allergies.  Allergy Precautions: None required Coagulopathies: Reviewed. None identified.  Blood-thinner therapy: None at this time Active Infection(s): Reviewed. None identified. Zachary Fernandez is afebrile  Site Confirmation: Zachary Fernandez was asked to confirm the procedure and laterality before marking the site Procedure checklist: Completed Consent: Before the procedure and under the influence of no sedative(s), amnesic(s), or anxiolytics, the patient was informed of the treatment options, risks and possible complications. To fulfill our ethical and legal obligations, as recommended by the American Medical Association's Code of Ethics, I have informed the patient of my clinical impression; the nature and purpose of the treatment or procedure; the risks, benefits, and possible complications of the intervention; the  alternatives,  including doing nothing; the risk(s) and benefit(s) of the alternative treatment(s) or procedure(s); and the risk(s) and benefit(s) of doing nothing. The patient was provided information about the general risks and possible complications associated with the procedure. These may include, but are not limited to: failure to achieve desired goals, infection, bleeding, organ or nerve damage, allergic reactions, paralysis, and death. In addition, the patient was informed of those risks and complications associated to Spine-related procedures, such as failure to decrease pain; infection (i.e.: Meningitis, epidural or intraspinal abscess); bleeding (i.e.: epidural hematoma, subarachnoid hemorrhage, or any other type of intraspinal or peri-dural bleeding); organ or nerve damage (i.e.: Any type of peripheral nerve, nerve root, or spinal cord injury) with subsequent damage to sensory, motor, and/or autonomic systems, resulting in permanent pain, numbness, and/or weakness of one or several areas of the body; allergic reactions; (i.e.: anaphylactic reaction); and/or death. Furthermore, the patient was informed of those risks and complications associated with the medications. These include, but are not limited to: allergic reactions (i.e.: anaphylactic or anaphylactoid reaction(s)); adrenal axis suppression; blood sugar elevation that in diabetics may result in ketoacidosis or comma; water retention that in patients with history of congestive heart failure may result in shortness of breath, pulmonary edema, and decompensation with resultant heart failure; weight gain; swelling or edema; medication-induced neural toxicity; particulate matter embolism and blood vessel occlusion with resultant organ, and/or nervous system infarction; and/or aseptic necrosis of one or more joints. Finally, the patient was informed that Medicine is not an exact science; therefore, there is also the possibility of unforeseen or unpredictable risks  and/or possible complications that may result in a catastrophic outcome. The patient indicated having understood very clearly. We have given the patient no guarantees and we have made no promises. Enough time was given to the patient to ask questions, all of which were answered to the patient's satisfaction. Zachary Fernandez has indicated that he wanted to continue with the procedure. Attestation: I, the ordering provider, attest that I have discussed with the patient the benefits, risks, side-effects, alternatives, likelihood of achieving goals, and potential problems during recovery for the procedure that I have provided informed consent. Date  Time: 08/22/2021  1:15 PM  Imaging Guidance (Spinal):          Type of Imaging Technique: Fluoroscopy Guidance (Spinal) Indication(s): Assistance in needle guidance and placement for procedures requiring needle placement in or near specific anatomical locations not easily accessible without such assistance. Exposure Time: Please see nurses notes. Contrast: Before injecting any contrast, we confirmed that the patient did not have an allergy to iodine, shellfish, or radiological contrast. Once satisfactory needle placement was completed at the desired level, radiological contrast was injected. Contrast injected under live fluoroscopy. No contrast complications. See chart for type and volume of contrast used. Fluoroscopic Guidance: I was personally present during the use of fluoroscopy. "Tunnel Vision Technique" used to obtain the best possible view of the target area. Parallax error corrected before commencing the procedure. "Direction-depth-direction" technique used to introduce the needle under continuous pulsed fluoroscopy. Once target was reached, antero-posterior, oblique, and lateral fluoroscopic projection used confirm needle placement in all planes. Images permanently stored in EMR. Interpretation: I personally interpreted the imaging intraoperatively. Adequate  needle placement confirmed in multiple planes. Appropriate spread of contrast into desired area was observed. No evidence of afferent or efferent intravascular uptake. No intrathecal or subarachnoid spread observed. Permanent images saved into the patient's record.  Pre-Procedure Preparation:  Monitoring: As per clinic protocol.  Respiration, ETCO2, SpO2, BP, heart rate and rhythm monitor placed and checked for adequate function Safety Precautions: Patient was assessed for positional comfort and pressure points before starting the procedure. Time-out: I initiated and conducted the "Time-out" before starting the procedure, as per protocol. The patient was asked to participate by confirming the accuracy of the "Time Out" information. Verification of the correct person, site, and procedure were performed and confirmed by me, the nursing staff, and the patient. "Time-out" conducted as per Joint Commission's Universal Protocol (UP.01.01.01). Time: 1356  Description  Narrative of Procedure:          Procedural Technique Safety Precautions: Aspiration looking for blood return was conducted prior to all injections. At no point did we inject any substances, as a needle was being advanced. No attempts were made at seeking any paresthesias. Safe injection practices and needle disposal techniques used. Medications properly checked for expiration dates. SDV (single dose vial) medications used. Description of the Procedure: Protocol guidelines were followed. The patient was assisted into a comfortable position. The target area was identified and the area prepped in the usual manner. Skin & deeper tissues infiltrated with local anesthetic. Appropriate amount of time allowed to pass for local anesthetics to take effect. The procedure needles were then advanced to the target area. Proper needle placement secured. Negative aspiration confirmed. Solution injected in intermittent fashion, asking for systemic symptoms every  0.5cc of injectate. The needles were then removed and the area cleansed, making sure to leave some of the prepping solution back to take advantage of its long term bactericidal properties.  Technical description of procedure:  Safety Precautions: Aspiration looking for blood return was conducted prior to all injections. At no point did we inject any substances, as a needle was being advanced. No attempts were made at seeking any paresthesias. Safe injection practices and needle disposal techniques used. Medications properly checked for expiration dates. SDV (single dose vial) medications used. Description of the Procedure: Protocol guidelines were followed. The patient was placed in position over the fluoroscopy table. The target area was identified and the area prepped in the usual manner. Skin & deeper tissues infiltrated with local anesthetic. Appropriate amount of time allowed to pass for local anesthetics to take effect. The procedure needles were then advanced to the target area. Proper needle placement secured. Negative aspiration confirmed. Solution injected in intermittent fashion, asking for systemic symptoms every 0.5cc of injectate. The needles were then removed and the area cleansed, making sure to leave some of the prepping solution back to take advantage of its long term bactericidal properties.  5 cc solution made of 2cc of preservative-free saline, 2 cc of 0.2% ropivacaine, 1 cc of Decadron 10 mg/cc.   Vitals:   08/22/21 1323 08/22/21 1353 08/22/21 1357 08/22/21 1359  BP: 124/63 126/73 124/70 116/73  Pulse: 72     Resp: 16 17 20  (!) 24  Temp: 98.1 F (36.7 C)     TempSrc: Temporal     SpO2: 98% 97% 96% 97%  Weight: 153 lb (69.4 kg)     Height: 5\' 6"  (1.676 m)        Start Time: 1356 hrs. End Time: 1359 hrs.  Post-operative Assessment:  Post-procedure Vital Signs:  Pulse/HCG Rate: 7277 Temp: 98.1 F (36.7 C) Resp:  (!) 24 BP: 116/73 SpO2: 97 %  EBL:  None  Complications: No immediate post-treatment complications observed by team, or reported by patient.  Note: The patient tolerated the entire procedure well. A repeat set  of vitals were taken after the procedure and the patient was kept under observation following institutional policy, for this type of procedure. Post-procedural neurological assessment was performed, showing return to baseline, prior to discharge. The patient was provided with post-procedure discharge instructions, including a section on how to identify potential problems. Should any problems arise concerning this procedure, the patient was given instructions to immediately contact us, at any time, without hesitation. In any case, we plan to contact the patient by telephone for a follow-up status report regarding this interventional procedure.  Comments:  No additional relevant information.  Plan of Care   5 out of 5 strength bilateral lower extremity: Plantar flexion, dorsiflexion, knee flexion, knee extension.   Orders:  Orders Placed This Encounter  Procedures   DG PAIN [facility] C-ARM 1-60 MIN NO REPORT    Intraoperative interpretation by procedural physician at [facility].    Standing Status:   Standing    Number of Occurrences:   1    Order Specific Question:   Reason for exam:    Answer:   Assistance in needle guidance and placement for procedures requiring needle placement in or near specific anatomical locations not easily accessible without such assistance.   Chronic Opioid Analgesic:  Tramadol 50-100 mg TID prn   Medications ordered for procedure: Meds ordered this encounter  Medications   iohexol (OMNIPAQUE) 180 MG/ML injection 10 mL    Must be Myelogram-compatible. If not available, you may substitute with a water-soluble, non-ionic, hypoallergenic, myelogram-compatible radiological contrast medium.   lidocaine (XYLOCAINE) 2 % (with pres) injection 400 mg   sodium chloride flush (NS) 0.9 %  injection 2 mL   ropivacaine (PF) 2 mg/mL (0.2%) (NAROPIN) injection 2 mL   dexamethasone (DECADRON) injection 10 mg   Medications administered: We administered iohexol, lidocaine, sodium chloride flush, ropivacaine (PF) 2 mg/mL (0.2%), and dexamethasone.  See the medical record for exact dosing, route, and time of administration.  Follow-up plan:   Return in about 4 weeks (around 09/19/2021) for Post Procedure Evaluation, virtual.       Interventional management options:  Considering: Occipital nerve block   C2-C3 medial branch nerve block Lumbar epidural steroid injection Lumbar facet medial branch nerve blocks S1-S3 lateral branch nerve blocks SI joint injection Sprint peripheral nerve stimulation   PRN Procedures:   None at this time        Recent Visits Date Type Provider Dept  08/08/21 Office Visit Edward Jolly, MD [facility]  Showing recent visits within past 90 days and meeting all other requirements Today's Visits Date Type Provider Dept  08/22/21 Procedure visit Edward Jolly, MD [facility]  Showing today's visits and meeting all other requirements Future Appointments Date Type Provider Dept  09/20/21 Appointment Edward Jolly, MD [facility]  Showing future appointments within next 90 days and meeting all other requirements  Disposition: Discharge home  Discharge (Date  Time): 08/22/2021; 1404 hrs.   Primary Care Physician: Pcp, No Location: [facility] Note by: Edward Jolly, MD Date: 08/22/2021; Time: 2:23 PM  Disclaimer:  Medicine is not an Visual merchandiser. The only guarantee in medicine is that nothing is guaranteed. It is important to note that the decision to proceed with this intervention was based on the information collected from the patient. The Data and conclusions were drawn from the patient's questionnaire, the interview, and the physical examination. Because the information was provided  in large part by the patient, it cannot be  guaranteed that it has not been purposely or unconsciously manipulated. Every effort has been made to obtain as much relevant data as possible for this evaluation. It is important to note that the conclusions that lead to this procedure are derived in large part from the available data. Always take into account that the treatment will also be dependent on availability of resources and existing treatment guidelines, considered by other Pain Management Practitioners as being common knowledge and practice, at the time of the intervention. For Medico-Legal purposes, it is also important to point out that variation in procedural techniques and pharmacological choices are the acceptable norm. The indications, contraindications, technique, and results of the above procedure should only be interpreted and judged by a Board-Certified Interventional Pain Specialist with extensive familiarity and expertise in the same exact procedure and technique.

## 2021-08-22 NOTE — Progress Notes (Signed)
Safety precautions to be maintained throughout the outpatient stay will include: orient to surroundings, keep bed in low position, maintain call bell within reach at all times, provide assistance with transfer out of bed and ambulation.  

## 2021-08-23 ENCOUNTER — Telehealth: Payer: Self-pay | Admitting: *Deleted

## 2021-08-23 NOTE — Telephone Encounter (Signed)
Attempted to call for post procedure follow-up. Message left. 

## 2021-09-02 ENCOUNTER — Telehealth: Payer: Self-pay | Admitting: Student in an Organized Health Care Education/Training Program

## 2021-09-02 NOTE — Telephone Encounter (Signed)
PT stated that since his procedures his pain hasn't got  any better. PT wants to know if he can get an VV appt to speak with doctor before his scheduled appt on 09-20-21.

## 2021-09-02 NOTE — Telephone Encounter (Signed)
Yes, may schedule VV whenever available

## 2021-09-04 ENCOUNTER — Other Ambulatory Visit: Payer: Self-pay | Admitting: Student in an Organized Health Care Education/Training Program

## 2021-09-08 ENCOUNTER — Ambulatory Visit
Payer: Medicare Other | Attending: Student in an Organized Health Care Education/Training Program | Admitting: Student in an Organized Health Care Education/Training Program

## 2021-09-08 DIAGNOSIS — S12101S Unspecified nondisplaced fracture of second cervical vertebra, sequela: Secondary | ICD-10-CM | POA: Diagnosis not present

## 2021-09-08 DIAGNOSIS — M47816 Spondylosis without myelopathy or radiculopathy, lumbar region: Secondary | ICD-10-CM | POA: Diagnosis not present

## 2021-09-08 DIAGNOSIS — M5416 Radiculopathy, lumbar region: Secondary | ICD-10-CM

## 2021-09-08 DIAGNOSIS — Z96653 Presence of artificial knee joint, bilateral: Secondary | ICD-10-CM

## 2021-09-08 DIAGNOSIS — M48062 Spinal stenosis, lumbar region with neurogenic claudication: Secondary | ICD-10-CM

## 2021-09-08 DIAGNOSIS — Z96643 Presence of artificial hip joint, bilateral: Secondary | ICD-10-CM

## 2021-09-08 DIAGNOSIS — G8929 Other chronic pain: Secondary | ICD-10-CM

## 2021-09-08 DIAGNOSIS — G894 Chronic pain syndrome: Secondary | ICD-10-CM

## 2021-09-08 MED ORDER — TRAMADOL HCL 50 MG PO TABS: 50.0000 mg | ORAL_TABLET | Freq: Four times a day (QID) | ORAL | 2 refills | Status: AC | PRN

## 2021-09-08 NOTE — Progress Notes (Signed)
Patient: Zachary Fernandez  Service Category: E/M  Provider: Gillis Santa, MD  DOB: 03-Mar-1940  DOS: 09/08/2021  Location: Office  MRN: [id number]  Setting: Ambulatory outpatient  Referring Provider: No ref. provider found  Type: Established Patient  Specialty: Interventional Pain Management  PCP: Pcp, No  Location: Remote location  Delivery: TeleHealth     Virtual Encounter - Pain Management PROVIDER NOTE: Information contained herein reflects review and annotations entered in association with encounter. Interpretation of such information and data should be left to medically-trained personnel. Information provided to patient can be located elsewhere in the medical record under "Patient Instructions". Document created using STT-dictation technology, any transcriptional errors that may result from process are unintentional.    Contact & Pharmacy Preferred: [phone] Home: [phone] (home) Mobile: There is no such number on file (mobile). E-mail: No e-mail address on record  [facility] [id number] [place], [place] - [address] [place] [address] Phone: [phone] Fax: [phone]  [place], [place] - [address] [place] [address] Phone: [phone] Fax: [phone]   Pre-screening  Zachary Fernandez offered "in-person" vs "virtual" encounter. He indicated preferring virtual for this encounter.   Reason COVID-19*  Social distancing based on CDC and AMA recommendations.   I contacted Zachary Fernandez on 09/08/2021 via telephone.      I clearly identified myself as Gillis Santa, MD. I verified that I was speaking with the correct person using two identifiers (Name: Zachary Fernandez, and date of birth: 08/29/1940).  Consent I sought verbal advanced consent from Zachary Fernandez for virtual visit interactions. I informed Zachary Fernandez of possible security and privacy concerns, risks, and limitations associated with providing  "not-in-person" medical evaluation and management services. I also informed Zachary Fernandez of the availability of "in-person" appointments. Finally, I informed him that there would be a charge for the virtual visit and that he could be  personally, fully or partially, financially responsible for it. Zachary Fernandez expressed understanding and agreed to proceed.   Historic Elements   Zachary Fernandez is a 81 y.o. year old, male patient evaluated today after our last contact on 09/04/2021. Zachary Fernandez  has a past medical history of Hypertension. He also  has a past surgical history that includes Revision total hip arthroplasty (Bilateral); Knee Arthroplasty (Left); Shoulder surgery (Left); and Cholecystectomy. Zachary Fernandez has a current medication list which includes the following prescription(s): amlodipine, barberry-oreg grape-goldenseal, bisoprolol-hydrochlorothiazide, calcium citrate, cyclobenzaprine, desipramine, meloxicam, multiple vitamins-minerals, rosuvastatin, tamsulosin, testosterone, testosterone, turmeric, and tramadol. He  reports that he has never smoked. He has never used smokeless tobacco. He reports current alcohol use. He reports that he does not use drugs. Zachary Fernandez has No Known Allergies.   HPI  Today, he is being contacted for both, medication management and a post-procedure assessment.   Post-procedure evaluation   Procedure: Caudal Epidural Steroid Injection        Laterality: Midline         Level: Sacrococcygeal ligament  Imaging: Fluoroscopy-guided         Anesthesia: Local anesthesia (1-2% Lidocaine) Anxiolysis: None                 Sedation:                .  DOS: 08/22/2021  Performed by: Gillis Santa, MD  Purpose: Diagnostic/Therapeutic Indications: Low back and lower extremity pain severe enough to impact quality of life or function.  Rationale (medical necessity): procedure needed and proper for the diagnosis and/or treatment of Zachary Fernandez medical symptoms and needs. 1. Chronic  radicular lumbar pain   2. Spinal stenosis, lumbar region, with neurogenic claudication   3. Chronic pain syndrome    NAS-11 Pain score:   Pre-procedure: 2 /10   Post-procedure: 0-No pain/10      Effectiveness:  Initial hour after procedure: 100 %  Subsequent 4-6 hours post-procedure: 100 %  Analgesia past initial 6 hours: 100 % (lasting 2-3 days)  Ongoing improvement:  Analgesic:  60-70% Function: Somewhat improved ROM: Somewhat improved   Pharmacotherapy Assessment   Opioid Analgesic: Tramadol 50-100 mg TID prn   Monitoring: [place] PMP: PDMP reviewed during this encounter.       Pharmacotherapy: No side-effects or adverse reactions reported. Compliance: No problems identified. Effectiveness: Clinically acceptable. Plan: Refer to "POC". UDS:  Summary  Date Value Ref Range Status  02/16/2020 Note  Final    Comment:    ==================================================================== Compliance Drug Analysis, Ur ==================================================================== Test                             Result       Flag       Units  Drug Present and Declared for Prescription Verification   Tramadol                       >3247        EXPECTED   ng/mg creat   O-Desmethyltramadol            >3247        EXPECTED   ng/mg creat   N-Desmethyltramadol            964          EXPECTED   ng/mg creat    Source of tramadol is a prescription medication. O-desmethyltramadol    and N-desmethyltramadol are expected metabolites of tramadol.    Desipramine                    PRESENT      EXPECTED    Desipramine may be administered as a prescription drug; it is also    an expected metabolite of imipramine.  Drug Absent but Declared for Prescription Verification   Gabapentin                     Not Detected UNEXPECTED ==================================================================== Test                      Result    Flag   Units      Ref Range   Creatinine               154              mg/dL      >=20 ==================================================================== Declared Medications:  The flagging and interpretation on this report are based on the  following declared medications.  Unexpected results may arise from  inaccuracies in the declared medications.   **Note: The testing scope of this panel includes these medications:   Desipramine (Norpramin)  Gabapentin (Neurontin)  Tramadol (Ultram)   **Note: The testing scope of this panel does not include the  following reported medications:   Amlodipine (Norvasc)  Bisoprolol  Calcium  Hydrochlorothiazide  Multivitamin  Rosuvastatin (Crestor)  Supplement  Tamsulosin (Flomax)  Testosterone  Turmeric ==================================================================== For  clinical consultation, please call [phone]. ====================================================================    No results found for: "CBDTHCR", "D8THCCBX", "D9THCCBX"   Laboratory Chemistry Profile   Renal Lab Results  Component Value Date   BUN 12 05/14/2020   CREATININE 0.77 05/14/2020   GFRNONAA >60 05/14/2020    Hepatic Lab Results  Component Value Date   AST 66 (H) 05/14/2020   ALT 49 (H) 05/14/2020   ALBUMIN 4.4 05/14/2020   ALKPHOS 81 05/14/2020    Electrolytes Lab Results  Component Value Date   NA 136 05/14/2020   K 4.5 05/14/2020   CL 99 05/14/2020   CALCIUM 9.5 05/14/2020    Bone No results found for: "VD25OH", "VD125OH2TOT", "EX9371IR6", "VE9381OF7", "25OHVITD1", "25OHVITD2", "25OHVITD3", "TESTOFREE", "TESTOSTERONE"  Inflammation (CRP: Acute Phase) (ESR: Chronic Phase) No results found for: "CRP", "ESRSEDRATE", "LATICACIDVEN"       Note: Above Lab results reviewed.   Assessment  The primary encounter diagnosis was Chronic radicular lumbar pain. Diagnoses of Spinal stenosis, lumbar region, with neurogenic claudication, Lumbar spondylosis, Closed nondisplaced fracture of  second cervical vertebra, unspecified fracture morphology, sequela, Lumbar facet arthropathy, History of bilateral knee replacement, Status post hip replacement, bilateral, and Chronic pain syndrome were also pertinent to this visit.  Plan of Care   Mr. Wynton Hufstetler has a current medication list which includes the following long-term medication(s): amlodipine, bisoprolol-hydrochlorothiazide, calcium citrate, desipramine, rosuvastatin, testosterone, and testosterone.  Pharmacotherapy (Medications Ordered): Meds ordered this encounter  Medications   traMADol (ULTRAM) 50 MG tablet    Sig: Take 1 tablet (50 mg total) by mouth every 6 (six) hours as needed for moderate pain.    Dispense:  120 tablet    Refill:  2   We will continue to monitor symptoms, repeat caudal ESI as needed.  Follow-up plan:   Return if symptoms worsen or fail to improve.     Interventional management options:  Considering: Occipital nerve block   C2-C3 medial branch nerve block Lumbar epidural steroid injection Lumbar facet medial branch nerve blocks S1-S3 lateral branch nerve blocks SI joint injection Sprint peripheral nerve stimulation   PRN Procedures:   None at this time         Recent Visits Date Type Provider Dept  08/22/21 Procedure visit Gillis Santa, MD [facility]  08/08/21 Office Visit Gillis Santa, MD [facility]  Showing recent visits within past 90 days and meeting all other requirements Today's Visits Date Type Provider Dept  09/08/21 Office Visit Gillis Santa, MD [facility]  Showing today's visits and meeting all other requirements Future Appointments No visits were found meeting these conditions. Showing future appointments within next 90 days and meeting all other requirements  I discussed the assessment and treatment plan with the patient. The patient was provided an opportunity to ask questions and all were answered. The patient agreed with the  plan and demonstrated an understanding of the instructions.  Patient advised to call back or seek an in-person evaluation if the symptoms or condition worsens.  Duration of encounter: 62mnutes.  Note by: BGillis Santa MD Date: 09/08/2021; Time: 3:29 PM

## 2021-09-20 ENCOUNTER — Telehealth: Payer: TRICARE For Life (TFL) | Admitting: Student in an Organized Health Care Education/Training Program

## 2021-12-12 ENCOUNTER — Observation Stay: Payer: Medicare Other | Admitting: Certified Registered"

## 2021-12-12 ENCOUNTER — Other Ambulatory Visit: Payer: Self-pay

## 2021-12-12 ENCOUNTER — Encounter
Admission: EM | Disposition: A | Discharge status: Home / Self Care | Payer: Self-pay | Source: Home / Self Care | Attending: Emergency Medicine

## 2021-12-12 ENCOUNTER — Emergency Department: Payer: Medicare Other

## 2021-12-12 ENCOUNTER — Observation Stay: Payer: Medicare Other

## 2021-12-12 ENCOUNTER — Observation Stay
Admission: EM | Admit: 2021-12-12 | Discharge: 2021-12-14 | Disposition: A | Discharge status: Home / Self Care | Payer: Medicare Other | Attending: Hospitalist | Admitting: Hospitalist

## 2021-12-12 DIAGNOSIS — W19XXXA Unspecified fall, initial encounter: Secondary | ICD-10-CM | POA: Diagnosis not present

## 2021-12-12 DIAGNOSIS — W07XXXA Fall from chair, initial encounter: Secondary | ICD-10-CM | POA: Insufficient documentation

## 2021-12-12 DIAGNOSIS — S73004A Unspecified dislocation of right hip, initial encounter: Secondary | ICD-10-CM | POA: Diagnosis not present

## 2021-12-12 DIAGNOSIS — D691 Qualitative platelet defects: Secondary | ICD-10-CM | POA: Insufficient documentation

## 2021-12-12 DIAGNOSIS — F10220 Alcohol dependence with intoxication, uncomplicated: Secondary | ICD-10-CM | POA: Insufficient documentation

## 2021-12-12 DIAGNOSIS — T84020A Dislocation of internal right hip prosthesis, initial encounter: Principal | ICD-10-CM | POA: Insufficient documentation

## 2021-12-12 DIAGNOSIS — F1010 Alcohol abuse, uncomplicated: Secondary | ICD-10-CM | POA: Diagnosis present

## 2021-12-12 DIAGNOSIS — Z79899 Other long term (current) drug therapy: Secondary | ICD-10-CM | POA: Insufficient documentation

## 2021-12-12 DIAGNOSIS — Y92002 Bathroom of unspecified non-institutional (private) residence single-family (private) house as the place of occurrence of the external cause: Secondary | ICD-10-CM | POA: Diagnosis not present

## 2021-12-12 DIAGNOSIS — I10 Essential (primary) hypertension: Secondary | ICD-10-CM | POA: Diagnosis not present

## 2021-12-12 DIAGNOSIS — Y792 Prosthetic and other implants, materials and accessory orthopedic devices associated with adverse incidents: Secondary | ICD-10-CM | POA: Diagnosis not present

## 2021-12-12 DIAGNOSIS — Y92009 Unspecified place in unspecified non-institutional (private) residence as the place of occurrence of the external cause: Secondary | ICD-10-CM

## 2021-12-12 DIAGNOSIS — S41112A Laceration without foreign body of left upper arm, initial encounter: Secondary | ICD-10-CM | POA: Diagnosis present

## 2021-12-12 DIAGNOSIS — N400 Enlarged prostate without lower urinary tract symptoms: Secondary | ICD-10-CM | POA: Diagnosis present

## 2021-12-12 DIAGNOSIS — Z96643 Presence of artificial hip joint, bilateral: Secondary | ICD-10-CM | POA: Insufficient documentation

## 2021-12-12 DIAGNOSIS — Z96652 Presence of left artificial knee joint: Secondary | ICD-10-CM | POA: Diagnosis not present

## 2021-12-12 DIAGNOSIS — F10920 Alcohol use, unspecified with intoxication, uncomplicated: Secondary | ICD-10-CM

## 2021-12-12 DIAGNOSIS — M25551 Pain in right hip: Secondary | ICD-10-CM | POA: Diagnosis present

## 2021-12-12 DIAGNOSIS — E785 Hyperlipidemia, unspecified: Secondary | ICD-10-CM | POA: Diagnosis present

## 2021-12-12 HISTORY — PX: HIP CLOSED REDUCTION: SHX983

## 2021-12-12 HISTORY — DX: Alcohol dependence, uncomplicated: F10.20

## 2021-12-12 HISTORY — DX: Hyperlipidemia, unspecified: E78.5

## 2021-12-12 LAB — COMPREHENSIVE METABOLIC PANEL
ALT: 23 U/L (ref 0–44)
AST: 37 U/L (ref 15–41)
Albumin: 3.9 g/dL (ref 3.5–5.0)
Alkaline Phosphatase: 89 U/L (ref 38–126)
Anion gap: 11 (ref 5–15)
BUN: 13 mg/dL (ref 8–23)
CO2: 24 mmol/L (ref 22–32)
Calcium: 9 mg/dL (ref 8.9–10.3)
Chloride: 104 mmol/L (ref 98–111)
Creatinine, Ser: 0.64 mg/dL (ref 0.61–1.24)
GFR, Estimated: >60 mL/min (ref >60)
Glucose, Bld: 125 mg/dL — ABNORMAL HIGH (ref 70–99)
Potassium: 3.9 mmol/L (ref 3.5–5.1)
Sodium: 139 mmol/L (ref 135–145)
Total Bilirubin: 0.8 mg/dL (ref 0.3–1.2)
Total Protein: 6.4 g/dL — ABNORMAL LOW (ref 6.5–8.1)

## 2021-12-12 LAB — CBC
HCT: 41.8 % (ref 39.0–52.0)
Hemoglobin: 13.9 g/dL (ref 13.0–17.0)
MCH: 32.5 pg (ref 26.0–34.0)
MCHC: 33.3 g/dL (ref 30.0–36.0)
MCV: 97.7 fL (ref 80.0–100.0)
Platelets: 138 10*3/uL — ABNORMAL LOW (ref 150–400)
RBC: 4.28 MIL/uL (ref 4.22–5.81)
RDW: 13.4 % (ref 11.5–15.5)
WBC: 8.4 10*3/uL (ref 4.0–10.5)
nRBC: 0 % (ref 0.0–0.2)

## 2021-12-12 LAB — TYPE AND SCREEN
ABO/RH(D): O POS
Antibody Screen: NEGATIVE

## 2021-12-12 LAB — PROTIME-INR
INR: 1.5 — ABNORMAL HIGH (ref 0.8–1.2)
Prothrombin Time: 17.7 seconds — ABNORMAL HIGH (ref 11.4–15.2)

## 2021-12-12 LAB — ETHANOL: Alcohol, Ethyl (B): 253 mg/dL — ABNORMAL HIGH (ref <10)

## 2021-12-12 LAB — APTT: aPTT: 35 seconds (ref 24–36)

## 2021-12-12 SURGERY — CLOSED REDUCTION, HIP
Anesthesia: General | Site: Hip | Laterality: Right

## 2021-12-12 MED ORDER — FENTANYL CITRATE (PF) 100 MCG/2ML IJ SOLN
INTRAMUSCULAR | Status: AC
  Filled 2021-12-12: qty 2

## 2021-12-12 MED ORDER — FOLIC ACID 1 MG PO TABS: 1.0000 mg | ORAL_TABLET | Freq: Every day | ORAL | Status: DC

## 2021-12-12 MED ORDER — HYDROCODONE-ACETAMINOPHEN 7.5-325 MG PO TABS
1.0000 | ORAL_TABLET | ORAL | Status: DC | PRN
  Administered 2021-12-12 – 2021-12-13 (×2): 2 via ORAL
  Administered 2021-12-13: 1 via ORAL
  Administered 2021-12-13: 2 via ORAL
  Administered 2021-12-13 – 2021-12-14 (×4): 1 via ORAL
  Filled 2021-12-12: qty 2
  Filled 2021-12-12: qty 1
  Filled 2021-12-12: qty 2
  Filled 2021-12-12: qty 1
  Filled 2021-12-12: qty 2
  Filled 2021-12-12 (×3): qty 1

## 2021-12-12 MED ORDER — THIAMINE HCL 100 MG/ML IJ SOLN: 100.0000 mg | Freq: Every day | INTRAMUSCULAR | Status: DC

## 2021-12-12 MED ORDER — ONDANSETRON HCL 4 MG/2ML IJ SOLN: 4.0000 mg | Freq: Four times a day (QID) | INTRAMUSCULAR | Status: DC | PRN

## 2021-12-12 MED ORDER — THIAMINE MONONITRATE 100 MG PO TABS: 100.0000 mg | ORAL_TABLET | Freq: Every day | ORAL | Status: DC

## 2021-12-12 MED ORDER — DOCUSATE SODIUM 100 MG PO CAPS
100.0000 mg | ORAL_CAPSULE | Freq: Two times a day (BID) | ORAL | Status: DC
  Administered 2021-12-12 – 2021-12-14 (×3): 100 mg via ORAL
  Filled 2021-12-12 (×3): qty 1

## 2021-12-12 MED ORDER — METOCLOPRAMIDE HCL 5 MG PO TABS: 5.0000 mg | ORAL_TABLET | Freq: Three times a day (TID) | ORAL | Status: DC | PRN

## 2021-12-12 MED ORDER — CYCLOBENZAPRINE HCL 10 MG PO TABS
5.0000 mg | ORAL_TABLET | Freq: Three times a day (TID) | ORAL | Status: DC | PRN
  Administered 2021-12-12: 5 mg via ORAL
  Filled 2021-12-12 (×2): qty 1

## 2021-12-12 MED ORDER — OXYCODONE HCL 5 MG PO TABS: 5.0000 mg | ORAL_TABLET | Freq: Once | ORAL | Status: DC | PRN

## 2021-12-12 MED ORDER — LORAZEPAM 1 MG PO TABS: 1.0000 mg | ORAL_TABLET | ORAL | Status: DC | PRN

## 2021-12-12 MED ORDER — THIAMINE MONONITRATE 100 MG PO TABS
100.0000 mg | ORAL_TABLET | Freq: Every day | ORAL | Status: DC
  Administered 2021-12-12 – 2021-12-14 (×2): 100 mg via ORAL
  Filled 2021-12-12 (×3): qty 1

## 2021-12-12 MED ORDER — MORPHINE SULFATE (PF) 2 MG/ML IV SOLN: 0.5000 mg | INTRAVENOUS | Status: DC | PRN

## 2021-12-12 MED ORDER — MAGNESIUM HYDROXIDE 400 MG/5ML PO SUSP
30.0000 mL | Freq: Every day | ORAL | Status: DC | PRN
  Administered 2021-12-13: 30 mL via ORAL
  Filled 2021-12-12: qty 30

## 2021-12-12 MED ORDER — PROPOFOL 10 MG/ML IV BOLUS
INTRAVENOUS | Status: AC
  Filled 2021-12-12: qty 20

## 2021-12-12 MED ORDER — OXYCODONE HCL 5 MG/5ML PO SOLN: 5.0000 mg | Freq: Once | ORAL | Status: DC | PRN

## 2021-12-12 MED ORDER — ADULT MULTIVITAMIN W/MINERALS CH: 1.0000 | ORAL_TABLET | Freq: Every day | ORAL | Status: DC

## 2021-12-12 MED ORDER — MIDAZOLAM HCL (PF) 10 MG/2ML IJ SOLN
INTRAMUSCULAR | Status: AC
  Filled 2021-12-12: qty 2

## 2021-12-12 MED ORDER — FENTANYL CITRATE (PF) 100 MCG/2ML IJ SOLN: 25.0000 ug | INTRAMUSCULAR | Status: DC | PRN

## 2021-12-12 MED ORDER — LORAZEPAM 2 MG/ML IJ SOLN: 0.0000 mg | Freq: Four times a day (QID) | INTRAMUSCULAR | Status: DC

## 2021-12-12 MED ORDER — TAMSULOSIN HCL 0.4 MG PO CAPS
0.4000 mg | ORAL_CAPSULE | Freq: Every day | ORAL | Status: DC
  Administered 2021-12-12 – 2021-12-14 (×3): 0.4 mg via ORAL
  Filled 2021-12-12 (×3): qty 1

## 2021-12-12 MED ORDER — ACETAMINOPHEN 650 MG RE SUPP: 650.0000 mg | Freq: Four times a day (QID) | RECTAL | Status: DC | PRN

## 2021-12-12 MED ORDER — LORAZEPAM 2 MG/ML IJ SOLN: 1.0000 mg | INTRAMUSCULAR | Status: DC | PRN

## 2021-12-12 MED ORDER — MIDAZOLAM HCL 5 MG/5ML IJ SOLN
INTRAMUSCULAR | Status: AC | PRN
  Administered 2021-12-12: 2.5 mg via INTRAVENOUS

## 2021-12-12 MED ORDER — KETAMINE HCL 50 MG/5ML IJ SOSY
PREFILLED_SYRINGE | INTRAMUSCULAR | Status: AC
  Filled 2021-12-12: qty 5

## 2021-12-12 MED ORDER — FOLIC ACID 1 MG PO TABS
1.0000 mg | ORAL_TABLET | Freq: Every day | ORAL | Status: DC
  Administered 2021-12-12 – 2021-12-14 (×3): 1 mg via ORAL
  Filled 2021-12-12 (×3): qty 1

## 2021-12-12 MED ORDER — HYDROCODONE-ACETAMINOPHEN 5-325 MG PO TABS: 1.0000 | ORAL_TABLET | ORAL | Status: DC | PRN

## 2021-12-12 MED ORDER — ONDANSETRON HCL 4 MG PO TABS: 4.0000 mg | ORAL_TABLET | Freq: Four times a day (QID) | ORAL | Status: DC | PRN

## 2021-12-12 MED ORDER — LORAZEPAM 2 MG/ML IJ SOLN: 0.0000 mg | Freq: Four times a day (QID) | INTRAMUSCULAR | Status: AC

## 2021-12-12 MED ORDER — MORPHINE SULFATE (PF) 2 MG/ML IV SOLN
0.5000 mg | INTRAVENOUS | Status: DC | PRN
  Administered 2021-12-12: 0.5 mg via INTRAVENOUS
  Filled 2021-12-12: qty 1

## 2021-12-12 MED ORDER — ADULT MULTIVITAMIN W/MINERALS CH
1.0000 | ORAL_TABLET | Freq: Every day | ORAL | Status: DC
  Administered 2021-12-12 – 2021-12-14 (×3): 1 via ORAL
  Filled 2021-12-12 (×3): qty 1

## 2021-12-12 MED ORDER — THIAMINE HCL 100 MG/ML IJ SOLN
100.0000 mg | Freq: Every day | INTRAMUSCULAR | Status: DC
  Administered 2021-12-13: 100 mg via INTRAVENOUS
  Filled 2021-12-12: qty 2

## 2021-12-12 MED ORDER — OXYCODONE-ACETAMINOPHEN 5-325 MG PO TABS
1.0000 | ORAL_TABLET | ORAL | Status: DC | PRN
  Administered 2021-12-12: 1 via ORAL
  Filled 2021-12-12: qty 1

## 2021-12-12 MED ORDER — LORAZEPAM 2 MG/ML IJ SOLN: 0.0000 mg | Freq: Two times a day (BID) | INTRAMUSCULAR | Status: DC

## 2021-12-12 MED ORDER — BISOPROLOL-HYDROCHLOROTHIAZIDE 5-6.25 MG PO TABS
1.0000 | ORAL_TABLET | Freq: Every day | ORAL | Status: DC
  Filled 2021-12-12: qty 1

## 2021-12-12 MED ORDER — PROPOFOL 500 MG/50ML IV EMUL
INTRAVENOUS | Status: DC | PRN
  Administered 2021-12-12 (×3): 50 mg via INTRAVENOUS

## 2021-12-12 MED ORDER — THIAMINE HCL 100 MG PO TABS
100.0000 mg | ORAL_TABLET | Freq: Every day | ORAL | Status: DC
  Filled 2021-12-12: qty 1

## 2021-12-12 MED ORDER — LIDOCAINE 5 % EX PTCH
1.0000 | MEDICATED_PATCH | CUTANEOUS | Status: DC
  Administered 2021-12-12 – 2021-12-14 (×2): 1 via TRANSDERMAL
  Filled 2021-12-12 (×2): qty 1

## 2021-12-12 MED ORDER — SUCCINYLCHOLINE CHLORIDE 200 MG/10ML IV SOSY
PREFILLED_SYRINGE | INTRAVENOUS | Status: DC | PRN
  Administered 2021-12-12: 60 mg via INTRAVENOUS

## 2021-12-12 MED ORDER — TRAZODONE HCL 50 MG PO TABS
25.0000 mg | ORAL_TABLET | Freq: Every evening | ORAL | Status: DC | PRN
  Administered 2021-12-13 (×2): 25 mg via ORAL
  Filled 2021-12-12 (×2): qty 1

## 2021-12-12 MED ORDER — ROSUVASTATIN CALCIUM 10 MG PO TABS
20.0000 mg | ORAL_TABLET | Freq: Every day | ORAL | Status: DC
  Administered 2021-12-12 – 2021-12-14 (×3): 20 mg via ORAL
  Filled 2021-12-12 (×2): qty 1
  Filled 2021-12-12 (×2): qty 2

## 2021-12-12 MED ORDER — ASPIRIN 81 MG PO TBEC
81.0000 mg | DELAYED_RELEASE_TABLET | Freq: Every day | ORAL | Status: DC
  Administered 2021-12-13 – 2021-12-14 (×2): 81 mg via ORAL
  Filled 2021-12-12 (×2): qty 1

## 2021-12-12 MED ORDER — AMLODIPINE BESYLATE 5 MG PO TABS: 5.0000 mg | ORAL_TABLET | Freq: Every day | ORAL | Status: DC

## 2021-12-12 MED ORDER — PROPOFOL 10 MG/ML IV BOLUS
INTRAVENOUS | Status: AC | PRN
  Administered 2021-12-12 (×4): 25 mg via INTRAVENOUS

## 2021-12-12 MED ORDER — FENTANYL CITRATE (PF) 100 MCG/2ML IJ SOLN
INTRAMUSCULAR | Status: DC | PRN
  Administered 2021-12-12 (×2): 25 ug via INTRAVENOUS

## 2021-12-12 MED ORDER — ACETAMINOPHEN 500 MG PO TABS
500.0000 mg | ORAL_TABLET | Freq: Four times a day (QID) | ORAL | Status: AC
  Filled 2021-12-12 (×2): qty 1

## 2021-12-12 MED ORDER — KETAMINE HCL 10 MG/ML IJ SOLN
INTRAMUSCULAR | Status: DC | PRN
  Administered 2021-12-12: 30 mg via INTRAVENOUS
  Administered 2021-12-12: 20 mg via INTRAVENOUS

## 2021-12-12 MED ORDER — PROPOFOL 10 MG/ML IV BOLUS
50.0000 mg | Freq: Once | INTRAVENOUS | Status: AC
  Administered 2021-12-12: 50 mg via INTRAVENOUS
  Filled 2021-12-12: qty 20

## 2021-12-12 MED ORDER — ONDANSETRON HCL 4 MG/2ML IJ SOLN: 4.0000 mg | Freq: Once | INTRAMUSCULAR | Status: DC | PRN

## 2021-12-12 MED ORDER — HYDRALAZINE HCL 20 MG/ML IJ SOLN: 5.0000 mg | INTRAMUSCULAR | Status: DC | PRN

## 2021-12-12 MED ORDER — ACETAMINOPHEN 325 MG PO TABS: 325.0000 mg | ORAL_TABLET | Freq: Four times a day (QID) | ORAL | Status: DC | PRN

## 2021-12-12 MED ORDER — MENTHOL 3 MG MT LOZG: 1.0000 | LOZENGE | OROMUCOSAL | Status: DC | PRN

## 2021-12-12 MED ORDER — PHENOL 1.4 % MT LIQD: 1.0000 | OROMUCOSAL | Status: DC | PRN

## 2021-12-12 MED ORDER — ACETAMINOPHEN 325 MG PO TABS
650.0000 mg | ORAL_TABLET | Freq: Four times a day (QID) | ORAL | Status: DC | PRN
  Administered 2021-12-12: 650 mg via ORAL
  Filled 2021-12-12: qty 2

## 2021-12-12 MED ORDER — METOCLOPRAMIDE HCL 5 MG/ML IJ SOLN: 5.0000 mg | Freq: Three times a day (TID) | INTRAMUSCULAR | Status: DC | PRN

## 2021-12-12 MED ORDER — SODIUM CHLORIDE 0.9 % IV SOLN: INTRAVENOUS | Status: DC

## 2021-12-12 SURGICAL SUPPLY — 1 items: PILLOW ABDUCTION FOAM SM (MISCELLANEOUS) IMPLANT

## 2021-12-12 NOTE — ED Notes (Signed)
Secured chatted MD and we are going to hold BP meds for surgery

## 2021-12-12 NOTE — ED Notes (Signed)
Skin tear on left forearm cleansed with saline, Dressing applied with non-stick guaze, gauze, kling and tape.
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
## 2021-12-12 NOTE — ED Triage Notes (Addendum)
Pt from home BIB [facility] d/t fall, pt was getting out of his chair and fell on the floor per EMS and family report. Pt c/o right hip pain, has had bilateral hip replacement and rt knee replacement. Family reports pt is a daily alcoholic and stated fall was alcohol induce. Pt reports he fell d/t "banging into the bathroom door, bang my elbow and fell, he denies falling from chair, stated he remembers the fall, denies hitting his head or LOC. Not on thinners GCS 15., but hard of hearing

## 2021-12-12 NOTE — ED Notes (Signed)
Informed RN bed assigned 
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
## 2021-12-12 NOTE — H&P (Signed)
History and Physical    Zachary Fernandez [id number] DOB: 10/16/1940 DOA: 12/12/2021  Referring MD/NP/PA:   PCP: Leonel Ramsay, MD   Patient coming from:  The patient is coming from home.     Chief Complaint: fall and right hip pain  HPI: Zachary Fernandez is a 81 y.o. 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  Per report, pt was alcohol intoxicated, had fall last night, injured his right hip. Patient developed pain in the hip, which is constant, sharp, severe, nonradiating.  Patient has skin tear in left upper arm.  He denies loss of consciousness.  Strongly denies any head or neck injury. He refused CT of head and CT of the neck. Patient does not have chest pain, cough, shortness breath.  No nausea vomiting, diarrhea or abdominal pain.  No symptoms of UTI.  Pt was found have right hip dislocation.  ED physician attempted multiple times to try to reduce the dislocation without success.  Data reviewed independently and ED Course: pt was found to have WBC 8.4, electrolytes renal function okay, temperature normal, blood pressure 123/64, heart rate 94, RR 29, oxygen saturation 100% on room air.  Chest x-ray negative.  X-ray showed right hip dislocation.  Patient is placed on MedSurg bed for obs. Dr. Sharlet Salina of ortho is consulted   EKG: I have personally reviewed.  Sinus rhythm, QTc 464, bifascicular block   Review of Systems:   General: no fevers, chills, no body weight gain, has fatigue HEENT: no blurry vision, hearing changes or sore throat Respiratory: no dyspnea, coughing, wheezing CV: no chest pain, no palpitations GI: no nausea, vomiting, abdominal pain, diarrhea, constipation GU: no dysuria, burning on urination, increased urinary frequency, hematuria  Ext: no leg edema Neuro: no unilateral weakness, numbness, or  tingling, no vision change or hearing loss. Has fall Skin: no rash. Has skin tear in left upper arm MSK: has right hip pain Heme: No easy bruising.  Travel history: No recent long distant travel.   Allergy:  Allergies  Allergen Reactions   Other     Hazelnut, black walnuts, brazilian nuts     Past Medical History:  Diagnosis Date   EtOH dependence ([place])    HLD (hyperlipidemia)    Hypertension     Past Surgical History:  Procedure Laterality Date   CHOLECYSTECTOMY     KNEE ARTHROPLASTY Left    REVISION TOTAL HIP ARTHROPLASTY Bilateral    SHOULDER SURGERY Left     Social History:  reports that he has never smoked. He has never used smokeless tobacco. He reports current alcohol use. He reports that he does not use drugs.  Family History:  Family History  Problem Relation Age of Onset   Diabetes Brother      Prior to Admission medications   Medication Sig Start Date End Date Taking? Authorizing Provider  amLODipine (NORVASC) 5 MG tablet Take 5 mg by mouth daily.   Yes [provider]  Barberry-Oreg Grape-Goldenseal (BERBERINE COMPLEX PO) Take 1 Dose by mouth daily.   Yes [provider]  bisoprolol-hydrochlorothiazide (ZIAC) 5-6.25 MG tablet Take 1 tablet by mouth daily.   Yes [provider]  Calcium Citrate (CITRACAL PO) Take 650 mg by mouth. 2 tabs in am   Yes [provider]  desipramine (NORPRAMIN) 25 MG tablet Take 25 mg by mouth at bedtime.   Yes [provider]  meloxicam (MOBIC) 7.5 MG tablet Take 7.5 mg by mouth daily.   Yes [provider]  Multiple Vitamins-Minerals (CENTRUM SILVER PO) Take 1 tablet by mouth daily.   Yes [provider]  rosuvastatin (CRESTOR) 20 MG tablet Take 20 mg by mouth daily.   Yes [provider]  tamsulosin (FLOMAX) 0.4 MG CAPS capsule Take 0.4 mg by mouth.   Yes [provider]  Testosterone 10 MG/ACT (2%) GEL Apply 4 Pump topically daily. 07/06/20  Yes  [provider]  TURMERIC PO Take 1 tablet by mouth daily.   Yes [provider]  cyclobenzaprine (FLEXERIL) 5 MG tablet Take 5 mg by mouth 3 (three) times daily as needed for muscle spasms.    [provider]  traMADol (ULTRAM) 50 MG tablet Take 50 mg by mouth every 6 (six) hours as needed.    [provider]    Physical Exam: Vitals:   12/12/21 1515 12/12/21 1530 12/12/21 1545 12/12/21 1611  BP: 135/73 132/71 131/72 134/73  Pulse: 87 87 91 92  Resp: 18 16 17 20   Temp:   99.4 F (37.4 C)   TempSrc:      SpO2: 96% 96% 95% 96%  Weight:      Height:       General: Not in acute distress HEENT:       Eyes: PERRL, EOMI, no scleral icterus.       ENT: No discharge from the ears and nose, no pharynx injection, no tonsillar enlargement.        Neck: No JVD, no bruit, no mass felt. Heme: No neck lymph node enlargement. Cardiac: S1/S2, RRR, No murmurs, No gallops or rubs. Respiratory: No rales, wheezing, rhonchi or rubs. GI: Soft, nondistended, nontender, no rebound pain, no organomegaly, BS present. GU: No hematuria Ext: No pitting leg edema bilaterally. 1+DP/PT pulse bilaterally. Musculoskeletal: has tenderness in right hip Skin: No rashes. Has skin tear in left upper arm  Neuro: Alert, oriented X3, cranial nerves II-XII grossly intact, moves all extremities. Psych: Patient is not psychotic, no suicidal or hemocidal ideation.  Labs on Admission: I have personally reviewed following labs and imaging studies  CBC: Recent Labs  Lab 12/12/21 0252  WBC 8.4  HGB 13.9  HCT 41.8  MCV 97.7  PLT 0000000*   Basic Metabolic Panel: Recent Labs  Lab 12/12/21 0252  NA 139  K 3.9  CL 104  CO2 24  GLUCOSE 125*  BUN 13  CREATININE 0.64  CALCIUM 9.0   GFR: Estimated Creatinine Clearance: 63.1 mL/min (by C-G formula based on SCr of 0.64 mg/dL). Liver Function Tests: Recent Labs  Lab 12/12/21 0252  AST 37  ALT 23  ALKPHOS 89  BILITOT 0.8  PROT  6.4*  ALBUMIN 3.9   No results for input(s): "LIPASE", "AMYLASE" in the last 168 hours. No results for input(s): "AMMONIA" in the last 168 hours. Coagulation Profile: Recent Labs  Lab 12/12/21 0755  INR 1.5*   Cardiac Enzymes: No results for input(s): "CKTOTAL", "CKMB", "CKMBINDEX", "TROPONINI" in the last 168 hours. BNP (last 3 results) No results for input(s): "PROBNP" in the last 8760 hours. HbA1C: No results for input(s): "HGBA1C" in the last 72 hours. CBG: No results for input(s): "GLUCAP" in the last 168 hours. Lipid Profile: No results for input(s): "CHOL", "HDL", "LDLCALC", "TRIG", "CHOLHDL", "LDLDIRECT" in the last 72 hours. Thyroid Function Tests: No results for input(s): "TSH", "T4TOTAL", "FREET4", "T3FREE", "THYROIDAB" in the last 72 hours. Anemia  Panel: No results for input(s): "VITAMINB12", "FOLATE", "FERRITIN", "TIBC", "IRON", "RETICCTPCT" in the last 72 hours. Urine analysis: No results found for: "COLORURINE", "APPEARANCEUR", "LABSPEC", "PHURINE", "GLUCOSEU", "HGBUR", "BILIRUBINUR", "KETONESUR", "PROTEINUR", "UROBILINOGEN", "NITRITE", "LEUKOCYTESUR" Sepsis Labs: @LABRCNTIP (procalcitonin:4,lacticidven:4) )No results found for this or any previous visit (from the past 240 hour(s)).   Radiological Exams on Admission: DG HIP UNILAT WITH PELVIS 2-3 VIEWS RIGHT  Result Date: 12/12/2021 CLINICAL DATA:  Closed reduction right hip EXAM: DG HIP (WITH OR WITHOUT PELVIS) 2-3V RIGHT COMPARISON:  Earlier same day FINDINGS: Multiple C-arm images show reduction of the dislocated femoral head. No sign of acute regional fracture. IMPRESSION: Successful reduction of the dislocated right femoral head. Electronically Signed   By: Nelson Chimes M.D.   On: 12/12/2021 14:46   DG C-Arm 1-60 Min-No Report  Result Date: 12/12/2021 Fluoroscopy was utilized by the requesting physician.  No radiographic interpretation.   DG Hip [place] W or [place] Pelvis 1 View Right  Result Date:  12/12/2021 CLINICAL DATA:  Right hip dislocation. EXAM: DG HIP (WITH OR WITHOUT PELVIS) 1V PORT RIGHT COMPARISON:  Earlier same day. FINDINGS: One view study shows persistent superior dislocation of the femoral component in this patient with right total hip replacement. Lucency in the femoral metaphysis appears nonacute and is probably related to old trauma or the prior surgery. 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 Presumed chronic posttraumatic versus postsurgical changes in the proximal femoral metaphysis. Direct comparison to earlier films recommended to insure chronicity. Electronically Signed   By: Misty Stanley M.D.   On: 12/12/2021 05:24   DG Chest 1 View  Result Date: 12/12/2021 CLINICAL DATA:  Right hip dislocation EXAM: CHEST  1 VIEW COMPARISON:  CT 05/14/2020 FINDINGS: Heart and mediastinal contours are within normal limits. No focal opacities or effusions. No acute bony abnormality. Aortic atherosclerosis. IMPRESSION: No active cardiopulmonary disease. Electronically Signed   By: Rolm Baptise M.D.   On: 12/12/2021 03:50   DG Hip Unilat W or Wo Pelvis 2-3 Views Right  Result Date: 12/12/2021 CLINICAL DATA:  Fall, right hip pain EXAM: DG HIP (WITH OR WITHOUT PELVIS) 2-3V RIGHT COMPARISON:  None Available. FINDINGS: Bilateral hip replacements. Superior dislocation of the right hip replacement. No visible fracture. IMPRESSION: Dislocated right hip replacement. Electronically Signed   By: Rolm Baptise M.D.   On: 12/12/2021 03:49      Assessment/Plan Principal Problem:   Closed dislocation of right hip [facility]) Active Problems:   Fall at home, initial encounter   HTN (hypertension)   HLD (hyperlipidemia)   BPH (benign prostatic hyperplasia)   Alcohol abuse   Skin tear of upper arm without complication, left, initial encounter   Assessment and Plan:  Closed dislocation of right hip [facility]): ED physician attempted multiple times to try to reduce the  dislocation without success. Consulted Dr. Sharlet Salina of ortho. -place med-surg bed for obs -As needed morphine, Percocet, Tylenol -Lidoderm patch -As needed Flexeril  Fall at home, initial encounter: Patient refused CT head and neck. -PT/OT -Fall precaution  HTN (hypertension): Blood pressure soft -IV hydralazine as needed -Hold amlodipine, Ziac  HLD (hyperlipidemia) -Crestor  BPH (benign prostatic hyperplasia) -Flomax  Alcohol abuse -CIWA protocol -Did counseling about importance of quitting alcohol abuse  Skin tear of upper arm without complication, left, initial encounter -Wound care per nurse     DVT ppx: SCD  Code Status: Full code  Family Communication: not done, no family member is at bed side.  Disposition Plan:  Anticipate discharge back to previous environment  Consults called: Dr. Okey Dupre of ortho  Admission status and Level of care: Med-Surg:   for obs  Dispo: The patient is from: Home              Anticipated d/c is to: Home              Anticipated d/c date is: 1 day              Patient currently is not medically stable to d/c.    Severity of Illness:  The appropriate patient status for this patient is OBSERVATION. Observation status is judged to be reasonable and necessary in order to provide the required intensity of service to ensure the patient's safety. The patient's presenting symptoms, physical exam findings, and initial radiographic and laboratory data in the context of their medical condition is felt to place them at decreased risk for further clinical deterioration. Furthermore, it is anticipated that the patient will be medically stable for discharge from the hospital within 2 midnights of admission.        Date of Service 12/12/2021    Lorretta Harp [facility]   If 7PM-7AM, please contact night-coverage [email or web address] 12/12/2021, 5:48 PM

## 2021-12-12 NOTE — Consult Note (Signed)
ORTHOPAEDIC CONSULTATION  REQUESTING PHYSICIAN: Zachary Harp, MD  Chief Complaint: Right hip pain  HPI: Zachary Fernandez is a 81 y.o. male who complains of right hip pain after mechanical fall.  The patient has a history of right total hip arthroplasty approximately 20 years ago in [place].  He has never had any prior problems with the hip.  X-rays in the ER showed presence of a dislocation.  Attempts made at reduction in the ER were unsuccessful.  Patient was admitted to the hospitalist service and orthopedics was consulted.  Patient lives with his family and exercises daily.  He does not use any assistive devices for ambulation.  Past Medical History:  Diagnosis Date   EtOH dependence ([facility])    HLD (hyperlipidemia)    Hypertension    Past Surgical History:  Procedure Laterality Date   CHOLECYSTECTOMY     KNEE ARTHROPLASTY Left    REVISION TOTAL HIP ARTHROPLASTY Bilateral    SHOULDER SURGERY Left    Social History   Socioeconomic History   Marital status: Widowed    Spouse name: Not on file   Number of children: Not on file   Years of education: Not on file   Highest education level: Not on file  Occupational History   Not on file  Tobacco Use   Smoking status: Never   Smokeless tobacco: Never  Substance and Sexual Activity   Alcohol use: Yes    Comment: daily   Drug use: Never   Sexual activity: Not on file  Other Topics Concern   Not on file  Social History Narrative   Not on file   Social Determinants of Health   Financial Resource Strain: Not on file  Food Insecurity: Not on file  Transportation Needs: Not on file  Physical Activity: Not on file  Stress: Not on file  Social Connections: Not on file   Family History  Problem Relation Age of Onset   Diabetes Brother    Allergies  Allergen Reactions   Other     Hazelnut, black walnuts, brazilian nuts    Prior to Admission medications   Medication Sig Start Date End Date Taking? Authorizing Provider   amLODipine (NORVASC) 5 MG tablet Take 5 mg by mouth daily.   Yes [provider]  Barberry-Oreg Grape-Goldenseal (BERBERINE COMPLEX PO) Take 1 Dose by mouth daily.   Yes [provider]  bisoprolol-hydrochlorothiazide (ZIAC) 5-6.25 MG tablet Take 1 tablet by mouth daily.   Yes [provider]  Calcium Citrate (CITRACAL PO) Take 650 mg by mouth. 2 tabs in am   Yes [provider]  desipramine (NORPRAMIN) 25 MG tablet Take 25 mg by mouth at bedtime.   Yes [provider]  meloxicam (MOBIC) 7.5 MG tablet Take 7.5 mg by mouth daily.   Yes [provider]  Multiple Vitamins-Minerals (CENTRUM SILVER PO) Take 1 tablet by mouth daily.   Yes [provider]  rosuvastatin (CRESTOR) 20 MG tablet Take 20 mg by mouth daily.   Yes [provider]  tamsulosin (FLOMAX) 0.4 MG CAPS capsule Take 0.4 mg by mouth.   Yes [provider]  Testosterone 10 MG/ACT (2%) GEL Apply 4 Pump topically daily. 07/06/20  Yes [provider]  TURMERIC PO Take 1 tablet by mouth daily.   Yes [provider]  cyclobenzaprine (FLEXERIL) 5 MG tablet Take 5 mg by mouth 3 (three) times daily as needed for muscle spasms.    [provider]  traMADol Janean Sark) 50  MG tablet Take 50 mg by mouth every 6 (six) hours as needed.    [provider]   DG Hip [place] W or [place] Pelvis 1 View Right  Result Date: 12/12/2021 CLINICAL DATA:  Right hip dislocation. EXAM: DG HIP (WITH OR WITHOUT PELVIS) 1V PORT RIGHT COMPARISON:  Earlier same day. FINDINGS: One view study shows persistent superior dislocation of the femoral component in this patient with right total hip replacement. Lucency in the femoral metaphysis appears nonacute and is probably related to old trauma or the prior surgery. 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 Presumed chronic posttraumatic versus postsurgical changes in the proximal femoral  metaphysis. Direct comparison to earlier films recommended to insure chronicity. Electronically Signed   By: Kennith Center M.D.   On: 12/12/2021 05:24   DG Chest 1 View  Result Date: 12/12/2021 CLINICAL DATA:  Right hip dislocation EXAM: CHEST  1 VIEW COMPARISON:  CT 05/14/2020 FINDINGS: Heart and mediastinal contours are within normal limits. No focal opacities or effusions. No acute bony abnormality. Aortic atherosclerosis. IMPRESSION: No active cardiopulmonary disease. Electronically Signed   By: Charlett Nose M.D.   On: 12/12/2021 03:50   DG Hip Unilat W or Wo Pelvis 2-3 Views Right  Result Date: 12/12/2021 CLINICAL DATA:  Fall, right hip pain EXAM: DG HIP (WITH OR WITHOUT PELVIS) 2-3V RIGHT COMPARISON:  None Available. FINDINGS: Bilateral hip replacements. Superior dislocation of the right hip replacement. No visible fracture. IMPRESSION: Dislocated right hip replacement. Electronically Signed   By: Charlett Nose M.D.   On: 12/12/2021 03:49    Positive ROS: All other systems have been reviewed and were otherwise negative with the exception of those mentioned in the HPI and as above.  Physical Exam: General: Alert, no acute distress Cardiovascular: No pedal edema Respiratory: No cyanosis, no use of accessory musculature GI: No organomegaly, abdomen is soft and non-tender Skin: No lesions in the area of chief complaint Neurologic: Sensation intact distally Psychiatric: Patient is competent for consent with normal mood and affect Lymphatic: No axillary or cervical lymphadenopathy  MUSCULOSKELETAL:  Right hip: Mild tenderness to palpation, right leg is slightly shortened.  Compartments soft. Good cap refill. Motor and sensory intact distally.  Assessment: 81 year old male admitted with a periprosthetic right hip dislocation  Plan: Long discussion with the patient and his family regarding the hip dislocation.  We will plan for closed reduction under anesthesia.  We will plan to place him  in a Bledsoe brace to restrict range of motion and discuss posterior hip precautions.  If the hip is unable to go and closed, we will likely request assistance from Dr. Odis Luster for open reduction another day.  Patient and family expressed understanding and informed consent was provided.    Ross Marcus, MD    12/12/2021 2:08 PM

## 2021-12-12 NOTE — Transfer of Care (Signed)
Immediate Anesthesia Transfer of Care Note  Patient: Zachary Fernandez  Procedure(s) Performed: CLOSED REDUCTION HIP (Right: Hip)  Patient Location: PACU  Anesthesia Type:General  Level of Consciousness: awake and alert   Airway & Oxygen Therapy: Patient Spontanous Breathing and Patient connected to face mask oxygen  Post-op Assessment: Report given to RN and Post -op Vital signs reviewed and stable  Post vital signs: Reviewed and stable  Last Vitals:  Vitals Value Taken Time  BP 119/67 12/12/21 1439  Temp 37.5 C 12/12/21 1439  Pulse 92 12/12/21 1441  Resp 16 12/12/21 1441  SpO2 100 % 12/12/21 1441  Vitals shown include unvalidated device data.  Last Pain:  Vitals:   12/12/21 1439  TempSrc:   PainSc: Asleep      Patients Stated Pain Goal: 0 (12/12/21 1305)  Complications: No notable events documented.

## 2021-12-12 NOTE — Op Note (Signed)
DATE OF SURGERY:  12/12/2021  TIME: 2:32 PM  PATIENT NAME:  Zachary Fernandez  AGE: 81 y.o.  PRE-OPERATIVE DIAGNOSIS:  Right periprosthetic hip dislocation  POST-OPERATIVE DIAGNOSIS:  SAME  PROCEDURE: Closed reduction under anesthesia right periprosthetic hip dislocation  SURGEON:  Ross Marcus  EBL:  0 cc  COMPLICATIONS: None  PREOPERATIVE INDICATIONS:  Zachary Fernandez is a 81 y.o. year old who fell and sustained a right periprosthetic hip dislocation.  Attempted reduction in the ER were unsuccessful.  He was then admitted and optimized and then elected for surgical intervention.    The risks benefits and alternatives were discussed with the patient including but not limited to the risks of nonoperative treatment, versus surgical intervention including infection, bleeding, nerve injury, fracture, recurrent dislocation, need for further surgery.  OPERATIVE PROCEDURE:  The patient was brought to the operating room and placed in the supine position.  He was placed on the operating room table.  Preoperative timeout was taken.  Anesthesia was administered with propofol as well as succinylcholine.  Pelvis was stabilized and axial traction was performed on the right leg followed by hip flexion.  There was a palpable clunk as the hip was gently reduced.  It was taken through range of motion and noted to be stable.  C-arm fluoroscopy was utilized to obtain AP and lateral imaging showing appropriate reduction.  Hip abduction pillow was placed.  Patient was awoken from anesthesia and taken to the PACU in stable condition.    POSTOPERATIVE PLAN: He will be weightbearing as tolerated.  Hip abduction brace will be ordered and fit for the patient to utilize at home. Okay to be discharged once patient clears PT. Recommend follow-up with in [facility] clinic in 2 to 3 weeks  Ross Marcus

## 2021-12-12 NOTE — Progress Notes (Signed)
Orthopedic Tech Progress Note Patient Details:  Zachary Fernandez 12-23-1940 [id number]  MD called requesting an ABDUCTION BRACE for patient, so I called in order to [employer].    Patient ID: Zachary Fernandez, male   DOB: 1940-12-15, 81 y.o.   MRN: [id number]  Zachary Fernandez 12/12/2021, 2:42 PM

## 2021-12-12 NOTE — Progress Notes (Signed)
Orthopedic Tech Progress Note Patient Details:  Zachary Fernandez 1940-02-27 [id number]  Called in order to [employer] regarding a HIP ABDUCTION BRACE, per [facility] they will have to order that brace and will be there on [facility] of this week.   Patient ID: Zachary Fernandez, male   DOB: 01/23/1940, 81 y.o.   MRN: [id number]  Donald Pore 12/12/2021, 3:06 PM

## 2021-12-12 NOTE — Discharge Instructions (Signed)
Orthopedic discharge instructions: Patient underwent closed reduction of right periprosthetic hip dislocation Patient should remain weightbearing on the operative extremity with use of the hip abduction brace until follow-up Follow up with Dr. Joie Bimler PA, Murilo Zanette PA-C at [facility], [place] office in 10-14 days for wound check, staple removal and x-ray.

## 2021-12-12 NOTE — ED Notes (Signed)
MD at bedside. 

## 2021-12-13 ENCOUNTER — Encounter: Payer: Self-pay | Admitting: Orthopaedic Surgery

## 2021-12-13 DIAGNOSIS — T84020A Dislocation of internal right hip prosthesis, initial encounter: Secondary | ICD-10-CM | POA: Diagnosis not present

## 2021-12-13 DIAGNOSIS — I10 Essential (primary) hypertension: Secondary | ICD-10-CM | POA: Diagnosis not present

## 2021-12-13 DIAGNOSIS — S73004A Unspecified dislocation of right hip, initial encounter: Secondary | ICD-10-CM | POA: Diagnosis not present

## 2021-12-13 DIAGNOSIS — W19XXXA Unspecified fall, initial encounter: Secondary | ICD-10-CM | POA: Diagnosis not present

## 2021-12-13 DIAGNOSIS — F1010 Alcohol abuse, uncomplicated: Secondary | ICD-10-CM | POA: Diagnosis not present

## 2021-12-13 LAB — BASIC METABOLIC PANEL
Anion gap: 6 (ref 5–15)
BUN: 10 mg/dL (ref 8–23)
CO2: 26 mmol/L (ref 22–32)
Calcium: 8.2 mg/dL — ABNORMAL LOW (ref 8.9–10.3)
Chloride: 106 mmol/L (ref 98–111)
Creatinine, Ser: 0.55 mg/dL — ABNORMAL LOW (ref 0.61–1.24)
GFR, Estimated: >60 mL/min (ref >60)
Glucose, Bld: 87 mg/dL (ref 70–99)
Potassium: 3.8 mmol/L (ref 3.5–5.1)
Sodium: 138 mmol/L (ref 135–145)

## 2021-12-13 LAB — CBC
HCT: 39.3 % (ref 39.0–52.0)
Hemoglobin: 13.3 g/dL (ref 13.0–17.0)
MCH: 32.8 pg (ref 26.0–34.0)
MCHC: 33.8 g/dL (ref 30.0–36.0)
MCV: 97 fL (ref 80.0–100.0)
Platelets: 115 10*3/uL — ABNORMAL LOW (ref 150–400)
RBC: 4.05 MIL/uL — ABNORMAL LOW (ref 4.22–5.81)
RDW: 13.2 % (ref 11.5–15.5)
WBC: 6.3 10*3/uL (ref 4.0–10.5)
nRBC: 0 % (ref 0.0–0.2)

## 2021-12-13 NOTE — Progress Notes (Signed)
Progress Note   Patient: Zachary Fernandez ATF:[id number] DOB: 10/24/1940 DOA: 12/12/2021     0 DOS: the patient was seen and examined on 12/13/2021   Brief hospital course: Taken from H&P.  Zachary Fernandez is a 81 y.o. 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  Per report patient was intoxicated with alcohol and had a fall and night before, injuring his right hip. Patient refused CT head and neck.  No chest pain or shortness of breath.  Data reviewed independently and ED Course: pt was found to have WBC 8.4, electrolytes renal function okay, temperature normal, blood pressure 123/64, heart rate 94, RR 29, oxygen saturation 100% on room air.  Chest x-ray negative.  X-ray showed right hip dislocation.  Orthopedic surgery was consulted after failed attempts of reducing the dislocation by ED provider.  S/p close reduction of periprosthetic hip dislocation under anesthesia. Orthopedic surgery is recommending hip abduction brace which will be delivered on Wednesday.  Patient will be weightbearing as tolerated per Ortho  Pending PT/OT are recommending home health. Patient would like to stay in the hospital until he gets that hip abduction brace which will most likely be delivered tomorrow.  Otherwise stable.      Assessment and Plan: * Closed dislocation of right hip (HCC) S/p closed reduction under anesthesia by orthopedic surgery. PT/OT are recommending home health which were ordered. Orthopedic surgery is recommending hip abduction brace which unfortunately cannot be delivered today for discharge, patient does not want to leave until he received that. -Continue with pain management -Continue with supportive care  Fall at home, initial encounter Resulted in right hip dislocation. -PT/OT are recommending home health -Continue with  fall precautions  HTN (hypertension) Blood pressure within goal. Home antihypertensives were held due to softer blood pressure initially. -Keep holding home antihypertensives-we will restart if blood pressure trending up  HLD (hyperlipidemia) -Continue Crestor  BPH (benign prostatic hyperplasia) -Continue Flomax  Alcohol abuse -CIWA protocol  Skin tear of upper arm without complication, left, initial encounter -Continue with wound care   Subjective: Patient was sitting comfortably in chair when seen today.  Worked with PT.  Pain seems well-controlled with current regimen.  He does not want to leave before getting his brace.  Physical Exam: Vitals:   12/12/21 1940 12/13/21 0000 12/13/21 0450 12/13/21 1012  BP: (!) 129/55 116/63 120/66 120/65  Pulse: (!) 102 93 88 97  Resp: 18 16 16 20   Temp: 98.7 F (37.1 C) 99 F (37.2 C) 98.7 F (37.1 C) 98.1 F (36.7 C)  TempSrc:      SpO2: 97% 95% 96% 97%  Weight:      Height:       General.  Well-developed gentleman, in no acute distress. Pulmonary.  Lungs clear bilaterally, normal respiratory effort. CV.  Regular rate and rhythm, no JVD, rub or murmur. Abdomen.  Soft, nontender, nondistended, BS positive. CNS.  Alert and oriented .  No focal neurologic deficit. Extremities.  No edema, no cyanosis, pulses intact and symmetrical. Psychiatry.  Judgment and insight appears normal.   Data Reviewed: Prior data reviewed  Family Communication: Discussed with patient  Disposition: Status is: Observation The patient remains OBS appropriate and will d/c before 2 midnights.  Planned Discharge Destination: Home with Home Health  Time spent: 40 minutes  This record has been created using . Errors have been  sought and corrected,but may not always be located. Such creation errors do not reflect on the standard of care.   Author: Arnetha Courser, MD 12/13/2021 1:23 PM  For on call review  [email or web address].

## 2021-12-13 NOTE — Assessment & Plan Note (Signed)
Continue with wound care

## 2021-12-13 NOTE — Progress Notes (Signed)
Met with the patient at the bedside He lives at home with his wife and she provides transportation He needs a 3 in 1 and rolling walker PT and OT  [employer] to deliver to the room Reviewed the [facility]

## 2021-12-13 NOTE — Assessment & Plan Note (Signed)
Continue Crestor 

## 2021-12-13 NOTE — Care Management Obs Status (Signed)
MEDICARE OBSERVATION STATUS NOTIFICATION   Patient Details  Name: Zachary Fernandez MRN: [id number] Date of Birth: 09/16/1940   Medicare Observation Status Notification Given:  Yes    Marlowe Sax, RN 12/13/2021, 11:30 AM

## 2021-12-13 NOTE — Anesthesia Postprocedure Evaluation (Signed)
Anesthesia Post Note  Patient: Zachary Fernandez  Procedure(s) Performed: CLOSED REDUCTION HIP (Right: Hip)  Patient location during evaluation: PACU Anesthesia Type: General Level of consciousness: awake and alert Pain management: pain level controlled Vital Signs Assessment: post-procedure vital signs reviewed and stable Respiratory status: spontaneous breathing, nonlabored ventilation, respiratory function stable and patient connected to nasal cannula oxygen Cardiovascular status: blood pressure returned to baseline and stable Postop Assessment: no apparent nausea or vomiting Anesthetic complications: no   No notable events documented.   Last Vitals:  Vitals:   12/13/21 0450 12/13/21 1012  BP: 120/66 120/65  Pulse: 88 97  Resp: 16 20  Temp: 37.1 C 36.7 C  SpO2: 96% 97%    Last Pain:  Vitals:   12/13/21 1012  TempSrc:   PainSc: 5                  Corinda Gubler

## 2021-12-13 NOTE — Progress Notes (Signed)
Patient is not able to walk the distance required to go the bathroom, or he/she is unable to safely negotiate stairs required to access the bathroom.  A 3in1 BSC will alleviate this problem  

## 2021-12-13 NOTE — Evaluation (Signed)
Occupational Therapy Evaluation Patient Details Name: Zachary Fernandez MRN: [id number] DOB: 12-24-1940 Today's Date: 12/13/2021   History of Present Illness Pt is an 81 y.o. male with medical history significant of hypertension, hyperlipidemia, alcohol abuse, BPH, and bilateral hip replacements who presents with fall resulting in R hip periprosthetic dislocation.  Pt now s/p closed reduction under anesthesia.   Clinical Impression   Patient presenting with decreased independence in self care, balance, functional mobility/transfers, and endurance. Prior to admission pt was independent with ADLs, IADLs, and functional mobility without an AD. During evaluation, pt required Min guard to stand from recliner and supervision for functional mobility at room level using RW. Pt was educated on RLE WBAT and posterior hip precautions. Education and demonstration was provided for LB dressing AE including reacher and sock aid with good carryover. Pt able to don/doff socks using AE with set up A. Pt will benefit from acute OT to increase overall independence in the areas of ADLs and functional mobility in order to safely discharge home. Pt could benefit from [facility] following D/C to decrease falls risk, improve balance, and maximize independence in self-care within own home environment.   Recommendations for follow up therapy are one component of a multi-disciplinary discharge planning process, led by the attending physician.  Recommendations may be updated based on patient status, additional functional criteria and insurance authorization.   Follow Up Recommendations  Home health OT     Assistance Recommended at Discharge Intermittent Supervision/Assistance  Patient can return home with the following A little help with walking and/or transfers;A little help with bathing/dressing/bathroom;Assistance with cooking/housework;Assist for transportation;Help with stairs or ramp for entrance    Functional Status  Assessment  Patient has had a recent decline in their functional status and demonstrates the ability to make significant improvements in function in a reasonable and predictable amount of time.  Equipment Recommendations  BSC/3in1;Other (comment) (sock aid)    Recommendations for Other Services       Precautions / Restrictions Precautions Precautions: Posterior Hip Precaution Booklet Issued: Yes (comment) Restrictions Weight Bearing Restrictions: Yes RLE Weight Bearing: Weight bearing as tolerated Other Position/Activity Restrictions: Hip Abd brace ordered and to be delivered 12/6; ok to mobilize with posterior hip precautions per Dr. Okey Dupre without brace      Mobility Bed Mobility               General bed mobility comments: NT, received/left sitting in recliner    Transfers Overall transfer level: Needs assistance Equipment used: Rolling walker (2 wheels) Transfers: Sit to/from Stand Sit to Stand: Min guard           General transfer comment: STS from recliner      Balance Overall balance assessment: Needs assistance Sitting-balance support: Feet supported Sitting balance-Leahy Scale: Good     Standing balance support: Bilateral upper extremity supported, During functional activity, Reliant on assistive device for balance Standing balance-Leahy Scale: Fair                             ADL either performed or assessed with clinical judgement   ADL Overall ADL's : Needs assistance/impaired                     Lower Body Dressing: With adaptive equipment;Sitting/lateral leans;Set up Lower Body Dressing Details (indicate cue type and reason): to don/doff socks Toilet Transfer: Rolling walker (2 wheels);Min guard Toilet Transfer Details (indicate cue type and reason): simulated  with STS from recliner         Functional mobility during ADLs: Supervision/safety;Rolling walker (2 wheels) (at room level)       Vision Patient Visual  Report: No change from baseline       Perception     Praxis      Pertinent Vitals/Pain Pain Assessment Pain Assessment: 0-10 Pain Score: 2  Pain Location: R hip Pain Descriptors / Indicators: Sore Pain Intervention(s): Monitored during session, Repositioned     Hand Dominance     Extremity/Trunk Assessment Upper Extremity Assessment Upper Extremity Assessment: Overall WFL for tasks assessed   Lower Extremity Assessment Lower Extremity Assessment: Generalized weakness;RLE deficits/detail RLE: Unable to fully assess due to pain       Communication Communication Communication: No difficulties (hearing aids present)   Cognition Arousal/Alertness: Awake/alert Behavior During Therapy: WFL for tasks assessed/performed Overall Cognitive Status: Within Functional Limits for tasks assessed                                       General Comments       Exercises Other Exercises Other Exercises: OT provided education re: role of OT, OT POC, post acute recs, sitting up for all meals, EOB/OOB mobility with assistance, home/fall safety, posterior hip precautions, LB dressing AE, RLE WBAT, handout provided   Shoulder Instructions      Home Living Family/patient expects to be discharged to:: Private residence Living Arrangements: Spouse/significant other Available Help at Discharge: Family;Available 24 hours/day Type of Home: Apartment Home Access: Level entry;Elevator     Home Layout: One level     Bathroom Shower/Tub: Producer, television/film/video: Standard     Home Equipment: Shower seat - built in;Wheelchair - manual;Hand held shower head;Adaptive equipment;Grab bars - tub/shower Adaptive Equipment: Reacher;Long-handled shoe horn        Prior Functioning/Environment Prior Level of Function : Independent/Modified Independent;History of Falls (last six months)             Mobility Comments: Ind amb community distances without an AD, works  out at [employer] 2-3x/wk ADLs Comments: Ind with ADLs        OT Problem List: Decreased strength;Decreased range of motion;Decreased activity tolerance;Impaired balance (sitting and/or standing);Decreased knowledge of use of DME or AE;Decreased knowledge of precautions;Pain      OT Treatment/Interventions: Self-care/ADL training;Therapeutic exercise;Therapeutic activities;Energy conservation;DME and/or AE instruction;Patient/family education    OT Goals(Current goals can be found in the care plan section) Acute Rehab OT Goals Patient Stated Goal: go home OT Goal Formulation: With patient Time For Goal Achievement: 12/27/21 Potential to Achieve Goals: Good   OT Frequency: Min 2X/week    Co-evaluation              AM-PAC OT "6 Clicks" Daily Activity     Outcome Measure Help from another person eating meals?: None Help from another person taking care of personal grooming?: A Little Help from another person toileting, which includes using toliet, bedpan, or urinal?: A Little Help from another person bathing (including washing, rinsing, drying)?: A Lot Help from another person to put on and taking off regular upper body clothing?: None Help from another person to put on and taking off regular lower body clothing?: A Little 6 Click Score: 19   End of Session Equipment Utilized During Treatment: Gait belt;Rolling walker (2 wheels) Nurse Communication: Mobility status;Precautions;Weight bearing status  Activity  Tolerance: Patient tolerated treatment well Patient left: in chair;with call bell/phone within reach;with chair alarm set  OT Visit Diagnosis: Unsteadiness on feet (R26.81);Muscle weakness (generalized) (M62.81);History of falling (Z91.81);Pain Pain - Right/Left: Right Pain - part of body: Hip                Time: 8592-9244 OT Time Calculation (min): 20 min Charges:  OT General Charges $OT Visit: 1 Visit OT Evaluation $OT Eval Low Complexity: 1 Low  [facility] MS, OTR/L ascom [phone]  12/13/21, 3:17 PM

## 2021-12-13 NOTE — Assessment & Plan Note (Signed)
Blood pressure within goal. Home antihypertensives were held due to softer blood pressure initially. -Keep holding home antihypertensives-we will restart if blood pressure trending up

## 2021-12-13 NOTE — Assessment & Plan Note (Signed)
Resulted in right hip dislocation. -PT/OT are recommending home health -Continue with fall precautions

## 2021-12-13 NOTE — Hospital Course (Addendum)
Taken from H&P.  Zachary Fernandez is a 81 y.o. 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  Per report patient was intoxicated with alcohol and had a fall and night before, injuring his right hip. Patient refused CT head and neck.  No chest pain or shortness of breath.  Data reviewed independently and ED Course: pt was found to have WBC 8.4, electrolytes renal function okay, temperature normal, blood pressure 123/64, heart rate 94, RR 29, oxygen saturation 100% on room air.  Chest x-ray negative.  X-ray showed right hip dislocation.  Orthopedic surgery was consulted after failed attempts of reducing the dislocation by ED provider.  S/p close reduction of periprosthetic hip dislocation under anesthesia. Orthopedic surgery is recommending hip abduction brace which will be delivered on Wednesday.  Patient will be weightbearing as tolerated per Ortho  Pending PT/OT are recommending home health. Patient would like to stay in the hospital until he gets that hip abduction brace which will most likely be delivered tomorrow.  Otherwise stable.

## 2021-12-13 NOTE — Assessment & Plan Note (Signed)
-   Continue Flomax 

## 2021-12-13 NOTE — Evaluation (Signed)
Physical Therapy Evaluation Patient Details Name: Zachary Fernandez MRN: [id number] DOB: 27-Feb-1940 Today's Date: 12/13/2021  History of Present Illness  Pt is an 81 y.o. male with medical history significant of hypertension, hyperlipidemia, alcohol abuse, BPH, and bilateral hip replacements who presents with fall resulting in R hip periprosthetic dislocation.  Pt now s/p closed reduction under anesthesia.   Clinical Impression  Pt was pleasant and motivated to participate during the session and put forth good effort throughout. Pt required extra time and effort along with cuing for proper sequencing for posterior hip precaution compliance with functional tasks but no physical assistance needed.  Pt reported only mild 2/10 R hip pain at onset of session that only increased slightly with activity. Pt reported no other adverse symptoms during the session with SpO2 and HR WNL.  Pt will benefit from HHPT upon discharge to safely address deficits listed in patient problem list for decreased caregiver assistance and eventual return to PLOF.         Recommendations for follow up therapy are one component of a multi-disciplinary discharge planning process, led by the attending physician.  Recommendations may be updated based on patient status, additional functional criteria and insurance authorization.  Follow Up Recommendations Home health PT      Assistance Recommended at Discharge    Patient can return home with the following  A little help with walking and/or transfers;A little help with bathing/dressing/bathroom;Assistance with cooking/housework;Assist for transportation    Equipment Recommendations Rolling walker (2 wheels);BSC/3in1  Recommendations for Other Services       Functional Status Assessment Patient has had a recent decline in their functional status and demonstrates the ability to make significant improvements in function in a reasonable and predictable amount of time.      Precautions / Restrictions Precautions Precautions: Posterior Hip Precaution Booklet Issued: Yes (comment) Restrictions Weight Bearing Restrictions: Yes RLE Weight Bearing: Weight bearing as tolerated Other Position/Activity Restrictions: Hip Abd brace ordered and to be delivered 12/6; ok to mobilize with posterior hip precautions per Dr. Sharlet Salina without brace      Mobility  Bed Mobility Overal bed mobility: Needs Assistance Bed Mobility: Supine to Sit     Supine to sit: Supervision     General bed mobility comments: Extra time and effort with use of rails and min verbal cues for sequencing    Transfers Overall transfer level: Needs assistance Equipment used: Rolling walker (2 wheels) Transfers: Sit to/from Stand Sit to Stand: Min guard           General transfer comment: Mod multi-modal cues for sequencing    Ambulation/Gait Ambulation/Gait assistance: Supervision Gait Distance (Feet): 200 Feet Assistive device: Rolling walker (2 wheels) Gait Pattern/deviations: Step-through pattern, Decreased step length - right, Decreased step length - left, Antalgic, Decreased stance time - left Gait velocity: decreased     General Gait Details: Mildly antalgic gait pattern on the LLE but steady without LOB or buckling  Stairs            Wheelchair Mobility    Modified Rankin (Stroke Patients Only)       Balance Overall balance assessment: Needs assistance   Sitting balance-Leahy Scale: Good     Standing balance support: Bilateral upper extremity supported, During functional activity, Reliant on assistive device for balance Standing balance-Leahy Scale: Fair                               Pertinent  Vitals/Pain Pain Assessment Pain Assessment: 0-10 Pain Score: 2  Pain Location: R hip Pain Descriptors / Indicators: Sore Pain Intervention(s): Monitored during session, Premedicated before session, Repositioned    Home Living Family/patient  expects to be discharged to:: Private residence Living Arrangements: Spouse/significant other Available Help at Discharge: Family;Available 24 hours/day Type of Home: Apartment Home Access: Level entry;Elevator       Home Layout: One level Home Equipment: Shower seat - built in;Wheelchair - manual      Prior Function Prior Level of Function : Independent/Modified Independent;History of Falls (last six months)             Mobility Comments: Ind amb community distances without an AD, works out at [employer] 2-3x/wk ADLs Comments: Ind with ADLs     Hand Dominance        Extremity/Trunk Assessment   Upper Extremity Assessment Upper Extremity Assessment: Overall WFL for tasks assessed    Lower Extremity Assessment Lower Extremity Assessment: Generalized weakness;RLE deficits/detail RLE: Unable to fully assess due to pain       Communication   Communication: No difficulties  Cognition Arousal/Alertness: Awake/alert Behavior During Therapy: WFL for tasks assessed/performed Overall Cognitive Status: Within Functional Limits for tasks assessed                                          General Comments      Exercises Total Joint Exercises Ankle Circles/Pumps: AROM, Strengthening, Both, 10 reps Quad Sets: Strengthening, Both, 10 reps Gluteal Sets: Strengthening, Both, 10 reps Long Arc Quad: Strengthening, Both, 10 reps Knee Flexion: Strengthening, Both, 10 reps Marching in Standing: Strengthening, Both, 5 reps, Standing Other Exercises Other Exercises: HEP education per handout Other Exercises: Posterior hip precaution education verbally, with handout, and during functional tasks Other Exercises: 90 deg R turn training to prevent R hip CKC IR Other Exercises: Car transfer sequencing eduction with pt and partner   Assessment/Plan    PT Assessment Patient needs continued PT services  PT Problem List Decreased strength;Decreased activity  tolerance;Decreased balance;Decreased mobility;Decreased knowledge of use of DME;Pain;Decreased knowledge of precautions       PT Treatment Interventions DME instruction;Gait training;Functional mobility training;Therapeutic activities;Therapeutic exercise;Balance training;Neuromuscular re-education;Patient/family education    PT Goals (Current goals can be found in the Care Plan section)  Acute Rehab PT Goals Patient Stated Goal: To walk better PT Goal Formulation: With patient Time For Goal Achievement: 12/26/21 Potential to Achieve Goals: Good    Frequency 7X/week     Co-evaluation               AM-PAC PT "6 Clicks" Mobility  Outcome Measure Help needed turning from your back to your side while in a flat bed without using bedrails?: A Little Help needed moving from lying on your back to sitting on the side of a flat bed without using bedrails?: A Little Help needed moving to and from a bed to a chair (including a wheelchair)?: A Little Help needed standing up from a chair using your arms (e.g., wheelchair or bedside chair)?: A Little Help needed to walk in hospital room?: A Little Help needed climbing 3-5 steps with a railing? : A Little 6 Click Score: 18    End of Session Equipment Utilized During Treatment: Gait belt Activity Tolerance: Patient tolerated treatment well Patient left: in chair;with call bell/phone within reach;with chair alarm set;with family/visitor present;with  SCD's reapplied Nurse Communication: Mobility status;Weight bearing status;Precautions PT Visit Diagnosis: Muscle weakness (generalized) (M62.81);Difficulty in walking, not elsewhere classified (R26.2);Pain Pain - Right/Left: Right Pain - part of body: Hip    Time: 0901-1007 PT Time Calculation (min) (ACUTE ONLY): 66 min   Charges:   PT Evaluation $PT Eval Moderate Complexity: 1 Mod PT Treatments $Gait Training: 8-22 mins $Therapeutic Activity: 23-37 mins       D. Royetta Asal PT,  DPT 12/13/21, 12:10 PM

## 2021-12-13 NOTE — Assessment & Plan Note (Signed)
S/p closed reduction under anesthesia by orthopedic surgery. PT/OT are recommending home health which were ordered. Orthopedic surgery is recommending hip abduction brace which unfortunately cannot be delivered today for discharge, patient does not want to leave until he received that. -Continue with pain management -Continue with supportive care

## 2021-12-13 NOTE — Assessment & Plan Note (Signed)
-   CIWA protocol 

## 2021-12-14 ENCOUNTER — Telehealth: Payer: Self-pay | Admitting: Student in an Organized Health Care Education/Training Program

## 2021-12-14 DIAGNOSIS — T84020A Dislocation of internal right hip prosthesis, initial encounter: Secondary | ICD-10-CM | POA: Diagnosis not present

## 2021-12-14 DIAGNOSIS — S73004A Unspecified dislocation of right hip, initial encounter: Secondary | ICD-10-CM | POA: Diagnosis not present

## 2021-12-14 MED ORDER — ASPIRIN 81 MG PO TBEC: 81.0000 mg | DELAYED_RELEASE_TABLET | Freq: Every day | ORAL | Status: DC

## 2021-12-14 MED ORDER — FOLIC ACID 1 MG PO TABS: 1.0000 mg | ORAL_TABLET | Freq: Every day | ORAL | Status: DC

## 2021-12-14 MED ORDER — VITAMIN B-1 100 MG PO TABS: 100.0000 mg | ORAL_TABLET | Freq: Every day | ORAL | Status: AC

## 2021-12-14 MED ORDER — HYDROCODONE-ACETAMINOPHEN 5-325 MG PO TABS: 1.0000 | ORAL_TABLET | Freq: Four times a day (QID) | ORAL | 0 refills | Status: AC | PRN

## 2021-12-14 MED ORDER — LIDOCAINE 5 % EX PTCH: 1.0000 | MEDICATED_PATCH | CUTANEOUS | 0 refills | Status: AC

## 2021-12-14 NOTE — Telephone Encounter (Signed)
Asking for Tramadol refill, last prescribed 08/2021. Patient advised that Tramadol must be prescribed at appt.

## 2021-12-14 NOTE — Discharge Summary (Signed)
Physician Discharge Summary   Zachary Fernandez  male DOB: [birth year]  QPY:[id number]  PCP: Mick Sell, MD  Admit date: 12/12/2021 Discharge date: 12/14/2021  Admitted From: home Disposition:  home Home Health: Yes CODE STATUS: Full code  Discharge Instructions     No wound care   Complete by: As directed       Hospital Course:  For full details, please see H&P, progress notes, consult notes and ancillary notes.  Briefly,  Zachary Fernandez is a 81 y.o. 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   Per report patient was intoxicated with alcohol and had a fall and night before, injuring his right hip.  Patient refused CT head and neck.  * Closed dislocation of right hip (HCC) S/p closed reduction under anesthesia by orthopedic surgery on 12/12/21 hip abduction brace was fitted for pt and pt worked with PT prior to discharge home with [facility].   Fall at home, initial encounter Resulted in right hip dislocation. -PT/OT rec home health   HTN (hypertension) Blood pressure within goal. Home antihypertensives were held due to softer blood pressure initially, all resumed after discharge.   HLD (hyperlipidemia) -Continue Crestor   BPH (benign prostatic hyperplasia) -Continue Flomax   Alcohol abuse --no withdrawal    Skin tear of upper arm without complication, left, initial encounter   Discharge Diagnoses:  Principal Problem:   Closed dislocation of right hip [facility]) Active Problems:   Fall at home, initial encounter   HTN (hypertension)   HLD (hyperlipidemia)   BPH (benign prostatic hyperplasia)   Alcohol abuse   Skin tear of upper arm without complication, left, initial encounter     Discharge Instructions:  Allergies as of 12/14/2021       Reactions   Other    Hazelnut, black walnuts, brazilian nuts          Medication List     TAKE these medications    amLODipine 5 MG tablet Commonly known as: NORVASC Take 5 mg by mouth daily.   BERBERINE COMPLEX PO Take 1 Dose by mouth daily.   bisoprolol-hydrochlorothiazide 5-6.25 MG tablet Commonly known as: ZIAC Take 1 tablet by mouth daily.   CENTRUM SILVER PO Take 1 tablet by mouth daily.   CITRACAL PO Take 650 mg by mouth. 2 tabs in am   cyclobenzaprine 5 MG tablet Commonly known as: FLEXERIL Take 5 mg by mouth 3 (three) times daily as needed for muscle spasms.   desipramine 25 MG tablet Commonly known as: NORPRAMIN Take 25 mg by mouth at bedtime.   folic acid 1 MG tablet Commonly known as: FOLVITE Take 1 tablet (1 mg total) by mouth daily. Start taking on: December 15, 2021   HYDROcodone-acetaminophen 5-325 MG tablet Commonly known as: NORCO/VICODIN Take 1 tablet by mouth every 6 (six) hours as needed for up to 5 days for moderate pain or severe pain.   lidocaine 5 % Commonly known as: LIDODERM Place 1 patch onto the skin daily. Remove & Discard patch within 12 hours or as directed by MD Start taking on: December 15, 2021   meloxicam 7.5 MG tablet Commonly known as: MOBIC Take 7.5 mg by mouth daily.   rosuvastatin 20 MG tablet Commonly known as: CRESTOR Take 20 mg by mouth daily.   tamsulosin 0.4 MG Caps capsule Commonly known as: FLOMAX Take 0.4 mg by mouth.   Testosterone  10 MG/ACT (2%) Gel Apply 4 Pump topically daily.   thiamine 100 MG tablet Commonly known as: Vitamin B-1 Take 1 tablet (100 mg total) by mouth daily. Start taking on: December 15, 2021   traMADol 50 MG tablet Commonly known as: ULTRAM Take 50 mg by mouth every 6 (six) hours as needed.   TURMERIC PO Take 1 tablet by mouth daily.               Durable Medical Equipment  (From admission, onward)           Start     Ordered   12/13/21 1127  For home use only DME Bedside commode  Once       Question:  Patient needs a bedside  commode to treat with the following condition  Answer:  Impaired mobility   12/13/21 1126   12/13/21 1011  For home use only DME Walker rolling  Once       Question Answer Comment  Walker: With 5 Inch Wheels   Patient needs a walker to treat with the following condition Difficulty walking      12/13/21 1010   12/13/21 1011  For home use only DME 3 n 1  Once        12/13/21 1011             Follow-up Information     Ross Marcus, MD Follow up in 2 week(s).   Specialty: Orthopedic Surgery Contact information: [address] [place] [address] [phone]                 Allergies  Allergen Reactions   Other     Hazelnut, black walnuts, brazilian nuts      The results of significant diagnostics from this hospitalization (including imaging, microbiology, ancillary and laboratory) are listed below for reference.   Consultations:   Procedures/Studies: DG HIP UNILAT WITH PELVIS 2-3 VIEWS RIGHT  Result Date: 12/12/2021 CLINICAL DATA:  Closed reduction right hip EXAM: DG HIP (WITH OR WITHOUT PELVIS) 2-3V RIGHT COMPARISON:  Earlier same day FINDINGS: Multiple C-arm images show reduction of the dislocated femoral head. No sign of acute regional fracture. IMPRESSION: Successful reduction of the dislocated right femoral head. Electronically Signed   By: Paulina Fusi M.D.   On: 12/12/2021 14:46   DG C-Arm 1-60 Min-No Report  Result Date: 12/12/2021 Fluoroscopy was utilized by the requesting physician.  No radiographic interpretation.   DG Hip [place] W or [place] Pelvis 1 View Right  Result Date: 12/12/2021 CLINICAL DATA:  Right hip dislocation. EXAM: DG HIP (WITH OR WITHOUT PELVIS) 1V PORT RIGHT COMPARISON:  Earlier same day. FINDINGS: One view study shows persistent superior dislocation of the femoral component in this patient with right total hip replacement. Lucency in the femoral metaphysis appears nonacute and is probably related to old trauma or the  prior surgery. 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 Presumed chronic posttraumatic versus postsurgical changes in the proximal femoral metaphysis. Direct comparison to earlier films recommended to insure chronicity. Electronically Signed   By: Kennith Center M.D.   On: 12/12/2021 05:24   DG Chest 1 View  Result Date: 12/12/2021 CLINICAL DATA:  Right hip dislocation EXAM: CHEST  1 VIEW COMPARISON:  CT 05/14/2020 FINDINGS: Heart and mediastinal contours are within normal limits. No focal opacities or effusions. No acute bony abnormality. Aortic atherosclerosis. IMPRESSION: No active cardiopulmonary disease. Electronically Signed   By: Charlett Nose M.D.   On: 12/12/2021  03:50   DG Hip Unilat W or Wo Pelvis 2-3 Views Right  Result Date: 12/12/2021 CLINICAL DATA:  Fall, right hip pain EXAM: DG HIP (WITH OR WITHOUT PELVIS) 2-3V RIGHT COMPARISON:  None Available. FINDINGS: Bilateral hip replacements. Superior dislocation of the right hip replacement. No visible fracture. IMPRESSION: Dislocated right hip replacement. Electronically Signed   By: Charlett Nose M.D.   On: 12/12/2021 03:49      Labs: BNP (last 3 results) No results for input(s): "BNP" in the last 8760 hours. Basic Metabolic Panel: Recent Labs  Lab 12/12/21 0252 12/13/21 0330  NA 139 138  K 3.9 3.8  CL 104 106  CO2 24 26  GLUCOSE 125* 87  BUN 13 10  CREATININE 0.64 0.55*  CALCIUM 9.0 8.2*   Liver Function Tests: Recent Labs  Lab 12/12/21 0252  AST 37  ALT 23  ALKPHOS 89  BILITOT 0.8  PROT 6.4*  ALBUMIN 3.9   No results for input(s): "LIPASE", "AMYLASE" in the last 168 hours. No results for input(s): "AMMONIA" in the last 168 hours. CBC: Recent Labs  Lab 12/12/21 0252 12/13/21 0330  WBC 8.4 6.3  HGB 13.9 13.3  HCT 41.8 39.3  MCV 97.7 97.0  PLT 138* 115*   Cardiac Enzymes: No results for input(s): "CKTOTAL", "CKMB", "CKMBINDEX", "TROPONINI" in the last 168 hours. BNP: Invalid  input(s): "POCBNP" CBG: No results for input(s): "GLUCAP" in the last 168 hours. D-Dimer No results for input(s): "DDIMER" in the last 72 hours. Hgb A1c No results for input(s): "HGBA1C" in the last 72 hours. Lipid Profile No results for input(s): "CHOL", "HDL", "LDLCALC", "TRIG", "CHOLHDL", "LDLDIRECT" in the last 72 hours. Thyroid function studies No results for input(s): "TSH", "T4TOTAL", "T3FREE", "THYROIDAB" in the last 72 hours.  Invalid input(s): "FREET3" Anemia work up No results for input(s): "VITAMINB12", "FOLATE", "FERRITIN", "TIBC", "IRON", "RETICCTPCT" in the last 72 hours. Urinalysis No results found for: "COLORURINE", "APPEARANCEUR", "LABSPEC", "PHURINE", "GLUCOSEU", "HGBUR", "BILIRUBINUR", "KETONESUR", "PROTEINUR", "UROBILINOGEN", "NITRITE", "LEUKOCYTESUR" Sepsis Labs Recent Labs  Lab 12/12/21 0252 12/13/21 0330  WBC 8.4 6.3   Microbiology No results found for this or any previous visit (from the past 240 hour(s)).   Total time spend on discharging this patient, including the last patient exam, discussing the hospital stay, instructions for ongoing care as it relates to all pertinent caregivers, as well as preparing the medical discharge records, prescriptions, and/or referrals as applicable, is 35 minutes.    Darlin Priestly, MD  [facility] 12/14/2021, 12:32 PM

## 2021-12-14 NOTE — Progress Notes (Signed)
Physical Therapy Treatment Patient Details Name: Zachary Fernandez MRN: [id number] DOB: [birth year] Today's Date: 12/14/2021   History of Present Illness Pt is an 81 y.o. male with medical history significant of hypertension, hyperlipidemia, alcohol abuse, BPH, and bilateral hip replacements who presents with fall resulting in R hip periprosthetic dislocation.  Pt now s/p closed reduction under anesthesia.    PT Comments    Pt was pleasant and motivated to participate during the session and put forth good effort throughout. Pt with R hip abd brace donned by [employer] rep. Pt and partner participated in mobility training per below with cues given for proper sequencing with brace donned with bed mobility tasks and transfers from various height surfaces.  Pt steady with transfers and with gait with good carryover of sequencing.  Pt will benefit from HHPT upon discharge to safely address deficits listed in patient problem list for decreased caregiver assistance and eventual return to PLOF.     Recommendations for follow up therapy are one component of a multi-disciplinary discharge planning process, led by the attending physician.  Recommendations may be updated based on patient status, additional functional criteria and insurance authorization.  Follow Up Recommendations  Home health PT     Assistance Recommended at Discharge    Patient can return home with the following A little help with walking and/or transfers;A little help with bathing/dressing/bathroom;Assistance with cooking/housework;Assist for transportation   Equipment Recommendations  Rolling walker (2 wheels);BSC/3in1    Recommendations for Other Services       Precautions / Restrictions Precautions Precautions: Posterior Hip Precaution Booklet Issued: Yes (comment) Required Braces or Orthoses: Other Brace Other Brace: R hip abd brace, 60 deg of hip flex per MD Restrictions Weight Bearing Restrictions: Yes RLE Weight Bearing:  Weight bearing as tolerated Other Position/Activity Restrictions: Hip Abd brace on with ambulation; on in bed if tolerated by pt but may use abd wedge pillow in place of abd brace if needed     Mobility  Bed Mobility Overal bed mobility: Needs Assistance Bed Mobility: Supine to Sit, Sit to Supine     Supine to sit: Min assist Sit to supine: Min assist   General bed mobility comments: Min A for BLE and trunk control during sup to/from sit with abd brace donned    Transfers Overall transfer level: Needs assistance Equipment used: Rolling walker (2 wheels) Transfers: Sit to/from Stand Sit to Stand: Supervision           General transfer comment: Mod verbal and visual cues for proper sequencing with abd brace donned    Ambulation/Gait Ambulation/Gait assistance: Supervision Gait Distance (Feet): 200 Feet Assistive device: Rolling walker (2 wheels) Gait Pattern/deviations: Step-through pattern, Decreased step length - right, Decreased step length - left, Decreased stance time - left Gait velocity: min decreased     General Gait Details: Mildly antalgic gait pattern on the RLE but steady without LOB or buckling   Stairs             Wheelchair Mobility    Modified Rankin (Stroke Patients Only)       Balance Overall balance assessment: Needs assistance Sitting-balance support: Feet supported Sitting balance-Leahy Scale: Good     Standing balance support: Bilateral upper extremity supported, During functional activity Standing balance-Leahy Scale: Good                              Cognition Arousal/Alertness: Awake/alert Behavior During Therapy: WFL for tasks  assessed/performed Overall Cognitive Status: Within Functional Limits for tasks assessed                                          Exercises Other Exercises: 90 deg R turn training to prevent CKC R hip IR Other Exercises: Posterior hip precaution education and  review Other Exercises: Bed mobility training with abd brace donned Other Exercises: Transfer training from various surfaces with abd brace donned    General Comments        Pertinent Vitals/Pain Pain Assessment Pain Assessment: 0-10 Pain Score: 2  Pain Location: R hip Pain Descriptors / Indicators: Sore Pain Intervention(s): Premedicated before session, Monitored during session    Home Living                          Prior Function            PT Goals (current goals can now be found in the care plan section) Progress towards PT goals: Progressing toward goals    Frequency    7X/week      PT Plan Current plan remains appropriate    Co-evaluation              AM-PAC PT "6 Clicks" Mobility   Outcome Measure  Help needed turning from your back to your side while in a flat bed without using bedrails?: A Little Help needed moving from lying on your back to sitting on the side of a flat bed without using bedrails?: A Little Help needed moving to and from a bed to a chair (including a wheelchair)?: A Little Help needed standing up from a chair using your arms (e.g., wheelchair or bedside chair)?: A Little Help needed to walk in hospital room?: A Little Help needed climbing 3-5 steps with a railing? : A Little 6 Click Score: 18    End of Session Equipment Utilized During Treatment: Gait belt Activity Tolerance: Patient tolerated treatment well Patient left: in chair;with call bell/phone within reach;with family/visitor present Nurse Communication: Mobility status;Weight bearing status;Precautions PT Visit Diagnosis: Muscle weakness (generalized) (M62.81);Difficulty in walking, not elsewhere classified (R26.2);Pain Pain - Right/Left: Right Pain - part of body: Hip     Time: 6147-0929 PT Time Calculation (min) (ACUTE ONLY): 37 min  Charges:  $Gait Training: 8-22 mins $Therapeutic Activity: 8-22 mins                     D. Scott Miosha Behe PT,  DPT 12/14/21, 11:47 AM

## 2021-12-14 NOTE — Progress Notes (Signed)
1214 D/C AVS completed and reviewed with pt. All opportunities for questions answered and clarified. IV removed. Pt will be wheeled down to car at medical mall entrance via wheelchair.

## 2021-12-14 NOTE — Telephone Encounter (Signed)
Patient is just getting out of the hospital today and is asking about a medication they gave him while he has been here. Wants to talk to a nurse about this. Please call his phone.

## 2021-12-28 ENCOUNTER — Encounter: Payer: Self-pay | Admitting: Student in an Organized Health Care Education/Training Program

## 2021-12-29 ENCOUNTER — Ambulatory Visit
Payer: Medicare Other | Attending: Student in an Organized Health Care Education/Training Program | Admitting: Student in an Organized Health Care Education/Training Program

## 2021-12-29 DIAGNOSIS — M5416 Radiculopathy, lumbar region: Secondary | ICD-10-CM

## 2021-12-29 DIAGNOSIS — M48062 Spinal stenosis, lumbar region with neurogenic claudication: Secondary | ICD-10-CM | POA: Diagnosis not present

## 2021-12-29 DIAGNOSIS — G894 Chronic pain syndrome: Secondary | ICD-10-CM | POA: Diagnosis not present

## 2021-12-29 DIAGNOSIS — G8929 Other chronic pain: Secondary | ICD-10-CM | POA: Diagnosis not present

## 2021-12-29 MED ORDER — TRAMADOL HCL 50 MG PO TABS: 50.0000 mg | ORAL_TABLET | Freq: Four times a day (QID) | ORAL | 1 refills | Status: DC | PRN

## 2021-12-29 NOTE — Progress Notes (Signed)
Patient: Zachary Fernandez  Service Category: E/M  Provider: Gillis Santa, MD  DOB: [birth year]  DOS: 12/29/2021  Location: Office  MRN: [id number]  Setting: Ambulatory outpatient  Referring Provider: Leonel Ramsay, MD  Type: Established Patient  Specialty: Interventional Pain Management  PCP: Leonel Ramsay, MD  Location: Remote location  Delivery: TeleHealth     Virtual Encounter - Pain Management PROVIDER NOTE: Information contained herein reflects review and annotations entered in association with encounter. Interpretation of such information and data should be left to medically-trained personnel. Information provided to patient can be located elsewhere in the medical record under "Patient Instructions". Document created using STT-dictation technology, any transcriptional errors that may result from process are unintentional.    Contact & Pharmacy Preferred: [phone] Home: [phone] (home) Mobile: [phone] (mobile) E-mail: [email or web address] [id number], [place] [address] Phone: [phone] Fax: [phone]  [place], [place] [address] [place] [address] Phone: [phone] Fax: [phone]   Pre-screening  Mr. Lembke offered "in-person" vs "virtual" encounter. He indicated preferring virtual for this encounter.   Reason COVID-19*  Social distancing based on CDC and AMA recommendations.   I contacted Zachary Fernandez on 12/29/2021 via telephone.      I clearly identified myself as Gillis Santa, MD. I verified that I was speaking with the correct person using two identifiers (Name: Auron Tadros, and date of birth: 83-Apr-1942).  Consent I sought verbal advanced consent from Zachary Fernandez for virtual visit interactions. I informed Mr. Hogg of possible security and privacy concerns, risks, and limitations associated with providing  "not-in-person" medical evaluation and management services. I also informed Mr. Yusko of the availability of "in-person" appointments. Finally, I informed him that there would be a charge for the virtual visit and that he could be  personally, fully or partially, financially responsible for it. Mr. Retherford expressed understanding and agreed to proceed.   Historic Elements   Mr. Maxie Slovacek is a 81 y.o. year old, male patient evaluated today after our last contact on 12/14/2021. Mr. Rodriguez  has a past medical history of EtOH dependence ([facility]), HLD (hyperlipidemia), and Hypertension. He also  has a past surgical history that includes Revision total hip arthroplasty (Bilateral); Knee Arthroplasty (Left); Shoulder surgery (Left); Cholecystectomy; and Hip Closed Reduction (Right, 12/12/2021). Mr. Mester has a current medication list which includes the following prescription(s): amlodipine, barberry-oreg grape-goldenseal, bisoprolol-hydrochlorothiazide, calcium citrate, cyclobenzaprine, desipramine, folic acid, lidocaine, multiple vitamins-minerals, rosuvastatin, tamsulosin, testosterone, thiamine, turmeric, meloxicam, and [START ON 02/09/2022] tramadol. He  reports that he has never smoked. He has never used smokeless tobacco. He reports current alcohol use. He reports that he does not use drugs. Mr. Okray is allergic to other.  Estimated body mass index is 24.69 kg/m as calculated from the following:   Height as of 12/12/21: _1  (1.676 m).   Weight as of 12/12/21: 153 lb (69.4 kg).  HPI  Today, he is being contacted for medication management.  No change in medical history since last visit.  Patient's pain is at baseline.  Patient continues multimodal pain regimen as prescribed.  States that it provides pain relief and improvement in functional status.   Pharmacotherapy Assessment   Opioid Analgesic: Tramadol 50-100 mg TID prn   Monitoring: [place] PMP: PDMP reviewed during this encounter.       Pharmacotherapy:  No side-effects or adverse reactions reported. Compliance: No  problems identified. Effectiveness: Clinically acceptable. Plan: Refer to "POC". UDS:  Summary  Date Value Ref Range Status  02/16/2020 Note  Final    Comment:    ==================================================================== Compliance Drug Analysis, Ur ==================================================================== Test                             Result       Flag       Units  Drug Present and Declared for Prescription Verification   Tramadol                       >3247        EXPECTED   ng/mg creat   O-Desmethyltramadol            >3247        EXPECTED   ng/mg creat   N-Desmethyltramadol            964          EXPECTED   ng/mg creat    Source of tramadol is a prescription medication. O-desmethyltramadol    and N-desmethyltramadol are expected metabolites of tramadol.    Desipramine                    PRESENT      EXPECTED    Desipramine may be administered as a prescription drug; it is also    an expected metabolite of imipramine.  Drug Absent but Declared for Prescription Verification   Gabapentin                     Not Detected UNEXPECTED ==================================================================== Test                      Result    Flag   Units      Ref Range   Creatinine              154              mg/dL      >=20 ==================================================================== Declared Medications:  The flagging and interpretation on this report are based on the  following declared medications.  Unexpected results may arise from  inaccuracies in the declared medications.   **Note: The testing scope of this panel includes these medications:   Desipramine (Norpramin)  Gabapentin (Neurontin)  Tramadol (Ultram)   **Note: The testing scope of this panel does not include the  following reported medications:   Amlodipine (Norvasc)  Bisoprolol  Calcium  Hydrochlorothiazide   Multivitamin  Rosuvastatin (Crestor)  Supplement  Tamsulosin (Flomax)  Testosterone  Turmeric ==================================================================== For clinical consultation, please call [phone]. ====================================================================    No results found for: "CBDTHCR", "D8THCCBX", "D9THCCBX"   Laboratory Chemistry Profile   Renal Lab Results  Component Value Date   BUN 10 12/13/2021   CREATININE 0.55 (L) 12/13/2021   GFRNONAA >60 12/13/2021    Hepatic Lab Results  Component Value Date   AST 37 12/12/2021   ALT 23 12/12/2021   ALBUMIN 3.9 12/12/2021   ALKPHOS 89 12/12/2021    Electrolytes Lab Results  Component Value Date   NA 138 12/13/2021   K 3.8 12/13/2021   CL 106 12/13/2021   CALCIUM 8.2 (L) 12/13/2021    Bone No results found for: "VD25OH", "VD125OH2TOT", "TD9741UL8", "GT3646OE3", "25OHVITD1", "25OHVITD2", "25OHVITD3", "TESTOFREE", "TESTOSTERONE"  Inflammation (CRP: Acute Phase) (ESR: Chronic Phase) No results found for: "CRP", "ESRSEDRATE", "  LATICACIDVEN"       Note: Above Lab results reviewed.  Imaging  DG HIP UNILAT WITH PELVIS 2-3 VIEWS RIGHT CLINICAL DATA:  Closed reduction right hip  EXAM: DG HIP (WITH OR WITHOUT PELVIS) 2-3V RIGHT  COMPARISON:  Earlier same day  FINDINGS: Multiple C-arm images show reduction of the dislocated femoral head. No sign of acute regional fracture.  IMPRESSION: Successful reduction of the dislocated right femoral head.  Electronically Signed   By: Nelson Chimes M.D.   On: 12/12/2021 14:46 DG C-Arm 1-60 Min-No Report Fluoroscopy was utilized by the requesting physician.  No radiographic  interpretation.  DG Hip Port Unilat W or [place] Pelvis 1 View Right CLINICAL DATA:  Right hip dislocation.  EXAM: DG HIP (WITH OR WITHOUT PELVIS) 1V PORT RIGHT  COMPARISON:  Earlier same day.  FINDINGS: One view study shows persistent superior dislocation of the  femoral component in this patient with right total hip replacement. Lucency in the femoral metaphysis appears nonacute and is probably related to old trauma or the prior surgery.  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  Presumed chronic posttraumatic versus postsurgical changes in the proximal femoral metaphysis. Direct comparison to earlier films recommended to insure chronicity.  Electronically Signed   By: Misty Stanley M.D.   On: 12/12/2021 05:24 DG Chest 1 View CLINICAL DATA:  Right hip dislocation  EXAM: CHEST  1 VIEW  COMPARISON:  CT 05/14/2020  FINDINGS: Heart and mediastinal contours are within normal limits. No focal opacities or effusions. No acute bony abnormality. Aortic atherosclerosis.  IMPRESSION: No active cardiopulmonary disease.  Electronically Signed   By: Rolm Baptise M.D.   On: 12/12/2021 03:50 DG Hip Unilat W or Wo Pelvis 2-3 Views Right CLINICAL DATA:  Fall, right hip pain  EXAM: DG HIP (WITH OR WITHOUT PELVIS) 2-3V RIGHT  COMPARISON:  None Available.  FINDINGS: Bilateral hip replacements. Superior dislocation of the right hip replacement. No visible fracture.  IMPRESSION: Dislocated right hip replacement.  Electronically Signed   By: Rolm Baptise M.D.   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  Plan of Care    Mr. Yazan Gatling has a current medication list which includes the following long-term medication(s): amlodipine, bisoprolol-hydrochlorothiazide, calcium citrate, desipramine, rosuvastatin, and testosterone.  Pharmacotherapy (Medications Ordered): Meds ordered this encounter  Medications   traMADol (ULTRAM) 50 MG tablet    Sig: Take 1 tablet (50 mg total) by mouth every 6 (six) hours as needed.     Dispense:  120 tablet    Refill:  1   Orders:  No orders of the defined types were placed in this encounter.  Follow-up plan:   Return if symptoms worsen or fail to improve.     Interventional management options:  Considering: Occipital nerve block   C2-C3 medial branch nerve block Lumbar epidural steroid injection Lumbar facet medial branch nerve blocks S1-S3 lateral branch nerve blocks SI joint injection Sprint peripheral nerve stimulation   PRN Procedures:   None at this time          Recent Visits No visits were found meeting these conditions. Showing recent visits within past 90 days and meeting all other requirements Today's Visits Date Type Provider Dept  12/29/21 Office Visit Gillis Santa, MD [facility]  Showing today's visits and meeting all other requirements  Future Appointments Date Type Provider Dept  01/31/22 Appointment Gillis Santa, MD [facility]  Showing future appointments within next 90 days and meeting all other requirements  I discussed the assessment and treatment plan with the patient. The patient was provided an opportunity to ask questions and all were answered. The patient agreed with the plan and demonstrated an understanding of the instructions.  Patient advised to call back or seek an in-person evaluation if the symptoms or condition worsens.  Duration of encounter: 16mnutes.  Note by: BGillis Santa MD Date: 12/29/2021; Time: 10:43 AM

## 2022-01-31 ENCOUNTER — Ambulatory Visit
Payer: Medicare Other | Attending: Student in an Organized Health Care Education/Training Program | Admitting: Student in an Organized Health Care Education/Training Program

## 2022-01-31 ENCOUNTER — Encounter: Payer: Self-pay | Admitting: Student in an Organized Health Care Education/Training Program

## 2022-01-31 VITALS — BP 124/71 | HR 88 | Temp 97.9°F | Resp 17 | Ht 65.0 in | Wt 150.0 lb

## 2022-01-31 DIAGNOSIS — M5416 Radiculopathy, lumbar region: Secondary | ICD-10-CM | POA: Diagnosis not present

## 2022-01-31 DIAGNOSIS — M48062 Spinal stenosis, lumbar region with neurogenic claudication: Secondary | ICD-10-CM | POA: Insufficient documentation

## 2022-01-31 DIAGNOSIS — Z96643 Presence of artificial hip joint, bilateral: Secondary | ICD-10-CM | POA: Diagnosis present

## 2022-01-31 DIAGNOSIS — G894 Chronic pain syndrome: Secondary | ICD-10-CM | POA: Diagnosis present

## 2022-01-31 DIAGNOSIS — M4726 Other spondylosis with radiculopathy, lumbar region: Secondary | ICD-10-CM | POA: Diagnosis not present

## 2022-01-31 DIAGNOSIS — S12101S Unspecified nondisplaced fracture of second cervical vertebra, sequela: Secondary | ICD-10-CM | POA: Diagnosis not present

## 2022-01-31 DIAGNOSIS — M47816 Spondylosis without myelopathy or radiculopathy, lumbar region: Secondary | ICD-10-CM | POA: Insufficient documentation

## 2022-01-31 DIAGNOSIS — Z96653 Presence of artificial knee joint, bilateral: Secondary | ICD-10-CM | POA: Insufficient documentation

## 2022-01-31 DIAGNOSIS — G8929 Other chronic pain: Secondary | ICD-10-CM | POA: Diagnosis present

## 2022-01-31 MED ORDER — TRAMADOL HCL 50 MG PO TABS: 50.0000 mg | ORAL_TABLET | Freq: Four times a day (QID) | ORAL | 2 refills | Status: AC | PRN

## 2022-01-31 NOTE — Progress Notes (Signed)
Nursing Pain Medication Assessment:  Safety precautions to be maintained throughout the outpatient stay will include: orient to surroundings, keep bed in low position, maintain call bell within reach at all times, provide assistance with transfer out of bed and ambulation.  Medication Inspection Compliance: Mr. Bozzi did not comply with our request to bring his pills to be counted. He was reminded that bringing the medication bottles, even when empty, is a requirement.  Medication: None brought in. Pill/Patch Count: None available to be counted. Bottle Appearance: No container available. Did not bring bottle(s) to appointment. Filled Date: N/A Last Medication intake:  Day before yesterday

## 2022-01-31 NOTE — Progress Notes (Signed)
PROVIDER NOTE: Information contained herein reflects review and annotations entered in association with encounter. Interpretation of such information and data should be left to medically-trained personnel. Information provided to patient can be located elsewhere in the medical record under "Patient Instructions". Document created using STT-dictation technology, any transcriptional errors that may result from process are unintentional.    Patient: Zachary Fernandez  Service Category: E/M  Provider: Edward Jolly, MD  DOB: 02/22/1940  DOS: 01/31/2022  Referring Provider: Mick Sell, MD  MRN: [id number]  Specialty: Interventional Pain Management  PCP: Mick Sell, MD  Type: Established Patient  Setting: Ambulatory outpatient    Location: Office  Delivery: Face-to-face     HPI  [name], a 82 y.o. 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 [name] primary complain today is Back Pain (lower) Last encounter: My last encounter with him was on 84/21/23 Pertinent problems: [name] has Chronic SI joint pain; Status post hip replacement, bilateral; History of bilateral knee replacement; Spinal stenosis, lumbar region, with neurogenic claudication; Chronic radicular lumbar pain; Lumbar spondylosis; Chronic pain syndrome; Pain management contract signed; Whiplash injury to neck; and Closed nondisplaced fracture of second cervical vertebra (HCC) on their pertinent problem list. Pain Assessment: Severity of Chronic pain is reported as a 1 /10. Location: Back Lower/sometimes down left leg to upper thigh. Onset: More than a month ago. Quality:  . Timing: Constant. Modifying factor(s): Tramadol. Vitals:  height is 5\' 5"  (1.651 m) and weight is 150 lb (68 kg). His temporal temperature is 97.9 F (36.6 C). His blood pressure is 124/71 and his pulse is 88. His respiration is 17 and oxygen saturation is 100%.   Reason for encounter: medication  management.  Approx 6 weeks ago, Mr Fernandez had to go to ED after fall and right hip dislocation. Per report patient was intoxicated with alcohol and had a fall and night before, injuring his right hip. This was reduced under [place]. He does have a hx of bilateral hip replacements. 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 Dermatology thinks it could have been related to latex although pt hasn't had issue with latex in past.  Here for med refill of Tramadol, takes 50-100 mg BID-TID prn depending on pain level.  Pharmacotherapy Assessment  Analgesic: Tramadol 50-100 mg TID prn   Monitoring: [place] PMP: PDMP reviewed during this encounter.       Pharmacotherapy: No side-effects or adverse reactions reported. Compliance: No problems identified. Effectiveness: Clinically acceptable.  Janann August, RN  01/31/2022  8:35 AM  Sign when Signing Visit Nursing Pain Medication Assessment:  Safety precautions to be maintained throughout the outpatient stay will include: orient to surroundings, keep bed in low position, maintain call bell within reach at all times, provide assistance with transfer out of bed and ambulation.  Medication Inspection Compliance: Mr. Javid did not comply with our request to bring his pills to be counted. He was reminded that bringing the medication bottles, even when empty, is a requirement.  Medication: None brought in. Pill/Patch Count: None available to be counted. Bottle Appearance: No container available. Did not bring bottle(s) to appointment. Filled Date: N/A Last Medication intake:  Day before yesterday    UDS:  Summary  Date Value Ref Range Status  02/16/2020 Note  Final    Comment:    ==================================================================== Compliance Drug Analysis,  [place] ==================================================================== Test                             Result       Flag       Units  Drug Present and Declared for Prescription Verification   Tramadol                       >3247        EXPECTED   ng/mg creat   O-Desmethyltramadol            >3247        EXPECTED   ng/mg creat   N-Desmethyltramadol            964          EXPECTED   ng/mg creat    Source of tramadol is a prescription medication. O-desmethyltramadol    and N-desmethyltramadol are expected metabolites of tramadol.    Desipramine                    PRESENT      EXPECTED    Desipramine may be administered as a prescription drug; it is also    an expected metabolite of imipramine.  Drug Absent but Declared for Prescription Verification   Gabapentin                     Not Detected UNEXPECTED ==================================================================== Test                      Result    Flag   Units      Ref Range   Creatinine              154              mg/dL      >=20 ==================================================================== Declared Medications:  The flagging and interpretation on this report are based on the  following declared medications.  Unexpected results may arise from  inaccuracies in the declared medications.   **Note: The testing scope of this panel includes these medications:   Desipramine (Norpramin)  Gabapentin (Neurontin)  Tramadol (Ultram)   **Note: The testing scope of this panel does not include the  following reported medications:   Amlodipine (Norvasc)  Bisoprolol  Calcium  Hydrochlorothiazide  Multivitamin  Rosuvastatin (Crestor)  Supplement  Tamsulosin (Flomax)  Testosterone  Turmeric ==================================================================== For clinical consultation, please call [phone]. ====================================================================      ROS  Constitutional: Denies  any fever or chills Gastrointestinal: No reported hemesis, hematochezia, vomiting, or acute GI distress Musculoskeletal:  Low back pain with radiation into right leg Neurological: No reported episodes of acute onset apraxia, aphasia, dysarthria, agnosia, amnesia, paralysis, loss of coordination, or loss of consciousness  Medication Review  Barberry-Oreg Grape-Goldenseal, Calcium Citrate, Multiple Vitamins-Minerals, Testosterone, Turmeric, amLODipine, bisoprolol-hydrochlorothiazide, cyclobenzaprine, desipramine, folic acid, rosuvastatin, tamsulosin, thiamine, and traMADol  History Review  Allergy: Mr. Urieta is allergic to other. Drug: Mr. Gilson  reports no history of drug use. Alcohol:  reports current alcohol use. Tobacco:  reports that he has never smoked. He has never used smokeless tobacco. Social: Mr. Otero  reports that he has never smoked. He has never used smokeless tobacco. He reports current alcohol use. He reports that he does not use drugs. Medical:  has a past medical history  of EtOH dependence ([place]), HLD (hyperlipidemia), and Hypertension. Surgical: Mr. Babar  has a past surgical history that includes Revision total hip arthroplasty (Bilateral); Knee Arthroplasty (Left); Shoulder surgery (Left); Cholecystectomy; and Hip Closed Reduction (Right, 12/12/2021). Family: family history includes Diabetes in his brother.  Laboratory Chemistry Profile   Renal Lab Results  Component Value Date   BUN 10 12/13/2021   CREATININE 0.55 (L) 12/13/2021   GFRNONAA >60 12/13/2021    Hepatic Lab Results  Component Value Date   AST 37 12/12/2021   ALT 23 12/12/2021   ALBUMIN 3.9 12/12/2021   ALKPHOS 89 12/12/2021    Electrolytes Lab Results  Component Value Date   NA 138 12/13/2021   K 3.8 12/13/2021   CL 106 12/13/2021   CALCIUM 8.2 (L) 12/13/2021    Bone No results found for: "VD25OH", "VD125OH2TOT", "[id number]", "UK:060616", "25OHVITD1", "25OHVITD2", "25OHVITD3", "TESTOFREE",  "TESTOSTERONE"  Inflammation (CRP: Acute Phase) (ESR: Chronic Phase) No results found for: "CRP", "ESRSEDRATE", "LATICACIDVEN"       Note: Above Lab results reviewed.  Recent Imaging Review  DG HIP UNILAT WITH PELVIS 2-3 VIEWS RIGHT CLINICAL DATA:  Closed reduction right hip  EXAM: DG HIP (WITH OR WITHOUT PELVIS) 2-3V RIGHT  COMPARISON:  Earlier same day  FINDINGS: Multiple C-arm images show reduction of the dislocated femoral head. No sign of acute regional fracture.  IMPRESSION: Successful reduction of the dislocated right femoral head.  Electronically Signed   By: Nelson Chimes M.D.   On: 12/12/2021 14:46 DG C-Arm 1-60 Min-No Report Fluoroscopy was utilized by the requesting physician.  No radiographic  interpretation.  DG Hip Port Unilat W or [place] Pelvis 1 View Right CLINICAL DATA:  Right hip dislocation.  EXAM: DG HIP (WITH OR WITHOUT PELVIS) 1V PORT RIGHT  COMPARISON:  Earlier same day.  FINDINGS: One view study shows persistent superior dislocation of the femoral component in this patient with right total hip replacement. Lucency in the femoral metaphysis appears nonacute and is probably related to old trauma or the prior surgery.  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  Presumed chronic posttraumatic versus postsurgical changes in the proximal femoral metaphysis. Direct comparison to earlier films recommended to insure chronicity.  Electronically Signed   By: Misty Stanley M.D.   On: 12/12/2021 05:24 DG Chest 1 View CLINICAL DATA:  Right hip dislocation  EXAM: CHEST  1 VIEW  COMPARISON:  CT 05/14/2020  FINDINGS: Heart and mediastinal contours are within normal limits. No focal opacities or effusions. No acute bony abnormality. Aortic atherosclerosis.  IMPRESSION: No active cardiopulmonary disease.  Electronically Signed   By: Rolm Baptise M.D.   On: 12/12/2021 03:50 DG Hip Unilat W or Wo Pelvis 2-3 Views  Right CLINICAL DATA:  Fall, right hip pain  EXAM: DG HIP (WITH OR WITHOUT PELVIS) 2-3V RIGHT  COMPARISON:  None Available.  FINDINGS: Bilateral hip replacements. Superior dislocation of the right hip replacement. No visible fracture.  IMPRESSION: Dislocated right hip replacement.  Electronically Signed   By: Rolm Baptise M.D.   On: 12/12/2021 03:49 Note: Reviewed        Physical Exam  General appearance: Well nourished, well developed, and well hydrated. In no apparent acute distress Mental status: Alert, oriented x 3 (person, place, & time)       Respiratory: No evidence of acute respiratory distress Eyes: PERLA Vitals: BP 124/71   Pulse 88   Temp 97.9 F (36.6 C) (Temporal)   Resp 17   Ht 5'  5" (1.651 m)   Wt 150 lb (68 kg)   SpO2 100%   BMI 24.96 kg/m  BMI: Estimated body mass index is 24.96 kg/m as calculated from the following:   Height as of this encounter: 5\' 5"  (1.651 m).   Weight as of this encounter: 150 lb (68 kg). Ideal: Ideal body weight: 61.5 kg (135 lb 9.3 oz) Adjusted ideal body weight: 64.1 kg (141 lb 5.6 oz)  Right hand swelling, bandage in place  Lumbar Spine Area Exam  Skin & Axial Inspection: No masses, redness, or swelling Alignment: Symmetrical Functional ROM: Pain restricted ROM       Stability: No instability detected Muscle Tone/Strength: Functionally intact. No obvious neuro-muscular anomalies detected. Sensory (Neurological): Dermatomal pain pattern right L3-L4  Gait & Posture Assessment  Ambulation: Limited Gait: Antalgic gait (limping) Posture: Difficulty standing up straight, due to pain  Lower Extremity Exam    Side: Right lower extremity  Side: Left lower extremity  Stability: No instability observed          Stability: No instability observed          Skin & Extremity Inspection: Skin color, temperature, and hair growth are WNL. No peripheral edema or cyanosis. No masses, redness, swelling, asymmetry, or associated skin  lesions. No contractures.  Skin & Extremity Inspection: Skin color, temperature, and hair growth are WNL. No peripheral edema or cyanosis. No masses, redness, swelling, asymmetry, or associated skin lesions. No contractures.  Functional ROM: Pain restricted ROM. Positive straight leg raise test on the right          Functional ROM: Unrestricted ROM                  Muscle Tone/Strength: Functionally intact. No obvious neuro-muscular anomalies detected.  Muscle Tone/Strength: Functionally intact. No obvious neuro-muscular anomalies detected.  Sensory (Neurological): Dermatomal pain pattern        Sensory (Neurological): Unimpaired        DTR: Patellar: deferred today Achilles: deferred today Plantar: deferred today  DTR: Patellar: deferred today Achilles: deferred today Plantar: deferred today  Palpation: No palpable anomalies  Palpation: No palpable anomalies    Assessment   Diagnosis Status  1. Chronic radicular lumbar pain   2. Spinal stenosis, lumbar region, with neurogenic claudication   3. Lumbar spondylosis   4. Closed nondisplaced fracture of second cervical vertebra, unspecified fracture morphology, sequela   5. History of bilateral knee replacement   6. Status post hip replacement, bilateral   7. Chronic pain syndrome     Persistent Persistent Persistent     Plan of Care  1. Chronic radicular lumbar pain - traMADol (ULTRAM) 50 MG tablet; Take 1 tablet (50 mg total) by mouth every 6 (six) hours as needed.  Dispense: 120 tablet; Refill: 2  2. Spinal stenosis, lumbar region, with neurogenic claudication - traMADol (ULTRAM) 50 MG tablet; Take 1 tablet (50 mg total) by mouth every 6 (six) hours as needed.  Dispense: 120 tablet; Refill: 2  3. Lumbar spondylosis  4. Closed nondisplaced fracture of second cervical vertebra, unspecified fracture morphology, sequela  5. History of bilateral knee replacement  6. Status post hip replacement, bilateral  7. Chronic pain  syndrome - traMADol (ULTRAM) 50 MG tablet; Take 1 tablet (50 mg total) by mouth every 6 (six) hours as needed.  Dispense: 120 tablet; Refill: 2  Consider repeating L-ESI if radicular pain  flare  Follow-up plan:   Return in about 3 months (around 05/11/2022) for Medication  Management, in person.     Interventional management options:  Considering: Occipital nerve block   C2-C3 medial branch nerve block Lumbar epidural steroid injection Lumbar facet medial branch nerve blocks S1-S3 lateral branch nerve blocks SI joint injection Sprint peripheral nerve stimulation   PRN Procedures:   None at this time       Recent Visits Date Type Provider Dept  12/29/21 Office Visit Gillis Santa, MD [facility]  Showing recent visits within past 90 days and meeting all other requirements Today's Visits Date Type Provider Dept  01/31/22 Office Visit Gillis Santa, MD [facility]  Showing today's visits and meeting all other requirements Future Appointments No visits were found meeting these conditions. Showing future appointments within next 90 days and meeting all other requirements  I discussed the assessment and treatment plan with the patient. The patient was provided an opportunity to ask questions and all were answered. The patient agreed with the plan and demonstrated an understanding of the instructions.  Patient advised to call back or seek an in-person evaluation if the symptoms or condition worsens.  Duration of encounter: 99minutes.  Total time on encounter, as per AMA guidelines included both the face-to-face and non-face-to-face time personally spent by the physician and/or other qualified health care professional(s) on the day of the encounter (includes time in activities that require the physician or other qualified health care professional and does not include time in activities normally performed by clinical staff). Physician's time may include the following  activities when performed: preparing to see the patient (eg, review of tests, pre-charting review of records) obtaining and/or reviewing separately obtained history performing a medically appropriate examination and/or evaluation counseling and educating the patient/family/caregiver ordering medications, tests, or procedures referring and communicating with other health care professionals (when not separately reported) documenting clinical information in the electronic or other health record independently interpreting results (not separately reported) and communicating results to the patient/ family/caregiver care coordination (not separately reported)  Note by: Gillis Santa, MD Date: 01/31/2022; Time: 9:09 AM

## 2022-03-08 ENCOUNTER — Telehealth: Payer: Self-pay | Admitting: Student in an Organized Health Care Education/Training Program

## 2022-03-08 NOTE — Telephone Encounter (Signed)
Called patient and left voicemail that I confirmed with [facility] that they do have the RX.

## 2022-03-08 NOTE — Telephone Encounter (Signed)
PT stated that he went to pharmacy to pick up his prescription for Tramadol and was told that no refill had been send in.PT stated that he was suppose to have enough until his next appt in April. Please give patient a call. TY

## 2022-04-19 ENCOUNTER — Other Ambulatory Visit: Payer: Self-pay | Admitting: Nurse Practitioner

## 2022-04-19 DIAGNOSIS — K760 Fatty (change of) liver, not elsewhere classified: Secondary | ICD-10-CM

## 2022-04-21 ENCOUNTER — Ambulatory Visit
Admission: RE | Admit: 2022-04-21 | Discharge: 2022-04-21 | Disposition: A | Discharge status: Home / Self Care | Payer: Medicare Other | Source: Ambulatory Visit | Attending: Nurse Practitioner | Admitting: Nurse Practitioner

## 2022-04-21 DIAGNOSIS — K760 Fatty (change of) liver, not elsewhere classified: Secondary | ICD-10-CM

## 2022-05-09 ENCOUNTER — Encounter: Payer: TRICARE For Life (TFL) | Admitting: Student in an Organized Health Care Education/Training Program

## 2022-05-31 ENCOUNTER — Other Ambulatory Visit: Payer: Self-pay | Admitting: Student in an Organized Health Care Education/Training Program

## 2022-05-31 DIAGNOSIS — G894 Chronic pain syndrome: Secondary | ICD-10-CM

## 2022-05-31 DIAGNOSIS — M48062 Spinal stenosis, lumbar region with neurogenic claudication: Secondary | ICD-10-CM

## 2022-05-31 DIAGNOSIS — G8929 Other chronic pain: Secondary | ICD-10-CM

## 2022-06-01 ENCOUNTER — Encounter: Payer: Self-pay | Admitting: Student in an Organized Health Care Education/Training Program

## 2022-06-01 ENCOUNTER — Ambulatory Visit
Payer: Medicare Other | Attending: Student in an Organized Health Care Education/Training Program | Admitting: Student in an Organized Health Care Education/Training Program

## 2022-06-01 VITALS — BP 129/81 | HR 85 | Temp 97.2°F | Resp 16 | Ht 65.0 in | Wt 153.0 lb

## 2022-06-01 DIAGNOSIS — G8929 Other chronic pain: Secondary | ICD-10-CM | POA: Insufficient documentation

## 2022-06-01 DIAGNOSIS — M48062 Spinal stenosis, lumbar region with neurogenic claudication: Secondary | ICD-10-CM | POA: Diagnosis present

## 2022-06-01 DIAGNOSIS — S12101S Unspecified nondisplaced fracture of second cervical vertebra, sequela: Secondary | ICD-10-CM | POA: Diagnosis present

## 2022-06-01 DIAGNOSIS — Z96643 Presence of artificial hip joint, bilateral: Secondary | ICD-10-CM | POA: Diagnosis present

## 2022-06-01 DIAGNOSIS — M47816 Spondylosis without myelopathy or radiculopathy, lumbar region: Secondary | ICD-10-CM | POA: Diagnosis present

## 2022-06-01 DIAGNOSIS — G894 Chronic pain syndrome: Secondary | ICD-10-CM | POA: Diagnosis present

## 2022-06-01 DIAGNOSIS — M4726 Other spondylosis with radiculopathy, lumbar region: Secondary | ICD-10-CM

## 2022-06-01 DIAGNOSIS — M5416 Radiculopathy, lumbar region: Secondary | ICD-10-CM | POA: Diagnosis present

## 2022-06-01 DIAGNOSIS — Z96653 Presence of artificial knee joint, bilateral: Secondary | ICD-10-CM | POA: Diagnosis present

## 2022-06-01 MED ORDER — TRAMADOL HCL 50 MG PO TABS: 50.0000 mg | ORAL_TABLET | Freq: Four times a day (QID) | ORAL | 2 refills | Status: DC | PRN

## 2022-06-01 NOTE — Patient Instructions (Signed)

## 2022-06-01 NOTE — Progress Notes (Signed)
PROVIDER NOTE: Information contained herein reflects review and annotations entered in association with encounter. Interpretation of such information and data should be left to medically-trained personnel. Information provided to patient can be located elsewhere in the medical record under "Patient Instructions". Document created using STT-dictation technology, any transcriptional errors that may result from process are unintentional.    Patient: Zachary Fernandez  Service Category: E/M  Provider: Edward Jolly, MD  DOB: Jan 21, 1940  DOS: 06/01/2022  Referring Provider: Mick Sell, MD  MRN: [id number]  Specialty: Interventional Pain Management  PCP: Mick Sell, MD  Type: Established Patient  Setting: Ambulatory outpatient    Location: Office  Delivery: Face-to-face     HPI  [name], a 82 y.o. 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 Mr. Exner primary complain today is Headache (From injury) and Back Pain Last encounter: My last encounter with him was on 01/31/22 Pertinent problems: Mr. Alzamora has Chronic SI joint pain; Status post hip replacement, bilateral; History of bilateral knee replacement; Spinal stenosis, lumbar region, with neurogenic claudication; Chronic radicular lumbar pain; Lumbar spondylosis; Chronic pain syndrome; Pain management contract signed; Whiplash injury to neck; and Closed nondisplaced fracture of second cervical vertebra (HCC) on their pertinent problem list. Pain Assessment: Severity of Chronic pain is reported as a 2 /10. Location: Back Lower/around to groin down legs to top of knee. Onset: More than a month ago. Quality: Aching, Discomfort, Sharp, Nagging, Jabbing. Timing: Intermittent. Modifying factor(s): Meds, rest, goes to gym and works out. Vitals:  height is 5\' 5"  (1.651 m) and weight is 153 lb (69.4 kg). His temperature is 97.2 F (36.2 C) (abnormal). His blood pressure is 129/81 and his pulse is  85. His respiration is 16 and oxygen saturation is 99%.   Reason for encounter: medication management. No change in medical history since last visit.  Patient's pain is at baseline.  Patient continues multimodal pain regimen as prescribed.  States that it provides pain relief and improvement in functional status. States that he goes to the gym approximately 3 to 4 days out of the week to work out.  This helps him with his range of motion and also helps to manage his pain.   Pharmacotherapy Assessment  Analgesic: Tramadol 50-100 mg BID prn   Monitoring: [place] PMP: PDMP reviewed during this encounter.       Pharmacotherapy: No side-effects or adverse reactions reported. Compliance: No problems identified. Effectiveness: Clinically acceptable.  Newman Pies, RN  06/01/2022 10:59 AM  Sign when Signing Visit Nursing Pain Medication Assessment:  Safety precautions to be maintained throughout the outpatient stay will include: orient to surroundings, keep bed in low position, maintain call bell within reach at all times, provide assistance with transfer out of bed and ambulation.  Medication Inspection Compliance: Pill count conducted under aseptic conditions, in front of the patient. Neither the pills nor the bottle was removed from the patient's sight at any time. Once count was completed pills were immediately returned to the patient in their original bottle.  Medication: Tramadol (Ultram) Pill/Patch Count:  24 of 120 pills remain Pill/Patch Appearance: Markings consistent with prescribed medication Bottle Appearance: Standard pharmacy container. Clearly labeled. Filled Date: 5 / 2 / 2024 Last Medication intake:  Today    UDS:  Summary  Date Value Ref Range Status  02/16/2020 Note  Final    Comment:    ==================================================================== Compliance Drug Analysis, Ur ==================================================================== Test  Result       Flag       Units  Drug Present and Declared for Prescription Verification   Tramadol                       >3247        EXPECTED   ng/mg creat   O-Desmethyltramadol            >3247        EXPECTED   ng/mg creat   N-Desmethyltramadol            964          EXPECTED   ng/mg creat    Source of tramadol is a prescription medication. O-desmethyltramadol    and N-desmethyltramadol are expected metabolites of tramadol.    Desipramine                    PRESENT      EXPECTED    Desipramine may be administered as a prescription drug; it is also    an expected metabolite of imipramine.  Drug Absent but Declared for Prescription Verification   Gabapentin                     Not Detected UNEXPECTED ==================================================================== Test                      Result    Flag   Units      Ref Range   Creatinine              154              mg/dL      >=40 ==================================================================== Declared Medications:  The flagging and interpretation on this report are based on the  following declared medications.  Unexpected results may arise from  inaccuracies in the declared medications.   **Note: The testing scope of this panel includes these medications:   Desipramine (Norpramin)  Gabapentin (Neurontin)  Tramadol (Ultram)   **Note: The testing scope of this panel does not include the  following reported medications:   Amlodipine (Norvasc)  Bisoprolol  Calcium  Hydrochlorothiazide  Multivitamin  Rosuvastatin (Crestor)  Supplement  Tamsulosin (Flomax)  Testosterone  Turmeric ==================================================================== For clinical consultation, please call [phone]. ====================================================================      ROS  Constitutional: Denies any fever or chills Gastrointestinal: No reported hemesis, hematochezia, vomiting, or acute GI  distress Musculoskeletal:  Low back pain with radiation into right leg Neurological: No reported episodes of acute onset apraxia, aphasia, dysarthria, agnosia, amnesia, paralysis, loss of coordination, or loss of consciousness  Medication Review  Barberry-Oreg Grape-Goldenseal, Calcium Citrate, Multiple Vitamins-Minerals, Testosterone, Turmeric, amLODipine, bisoprolol-hydrochlorothiazide, cyclobenzaprine, desipramine, folic acid, rosuvastatin, tamsulosin, thiamine, and traMADol  History Review  Allergy: Mr. Kohlman is allergic to other. Drug: Mr. Vogler  reports no history of drug use. Alcohol:  reports current alcohol use. Tobacco:  reports that he has never smoked. He has never used smokeless tobacco. Social: Mr. Torbeck  reports that he has never smoked. He has never used smokeless tobacco. He reports current alcohol use. He reports that he does not use drugs. Medical:  has a past medical history of EtOH dependence (HCC), HLD (hyperlipidemia), and Hypertension. Surgical: Mr. Rappaport  has a past surgical history that includes Revision total hip arthroplasty (Bilateral); Knee Arthroplasty (Left); Shoulder surgery (Left); Cholecystectomy; and Hip Closed Reduction (Right, 12/12/2021). Family: family history includes Diabetes in his  brother.  Laboratory Chemistry Profile   Renal Lab Results  Component Value Date   BUN 10 12/13/2021   CREATININE 0.55 (L) 12/13/2021   GFRNONAA >60 12/13/2021    Hepatic Lab Results  Component Value Date   AST 37 12/12/2021   ALT 23 12/12/2021   ALBUMIN 3.9 12/12/2021   ALKPHOS 89 12/12/2021    Electrolytes Lab Results  Component Value Date   NA 138 12/13/2021   K 3.8 12/13/2021   CL 106 12/13/2021   CALCIUM 8.2 (L) 12/13/2021    Bone No results found for: "VD25OH", "VD125OH2TOT", "WU9811BJ4", "NW2956OZ3", "25OHVITD1", "25OHVITD2", "25OHVITD3", "TESTOFREE", "TESTOSTERONE"  Inflammation (CRP: Acute Phase) (ESR: Chronic Phase) No results found for: "CRP",  "ESRSEDRATE", "LATICACIDVEN"       Note: Above Lab results reviewed.  Recent Imaging Review  US Abdomen Complete CLINICAL DATA:  Abdominal distension. Hepatic steatosis. Nodular liver on the May 14, 2020 CT scan.  EXAM: ABDOMEN ULTRASOUND COMPLETE  COMPARISON:  CT scan of the abdomen and pelvis May 14, 2020  FINDINGS: Gallbladder: Surgically absent  Common bile duct: Diameter: 6 mm  Liver: Diffuse increased echogenicity. No mass identified. Portal vein is patent on color Doppler imaging with normal direction of blood flow towards the liver.  IVC: No abnormality visualized.  Pancreas: Visualized portion unremarkable.  Spleen: Size and appearance within normal limits.  Right Kidney: Length: 10.8 cm. Echogenicity within normal limits. No mass or hydronephrosis visualized.  Left Kidney: Length: 11 cm. Echogenicity within normal limits. No mass or hydronephrosis visualized.  Abdominal aorta: No aneurysm visualized.  Other findings: None.  IMPRESSION: 1. Diffuse increased echogenicity throughout the liver is nonspecific and may be seen with hepatic steatosis or other underlying intrinsic liver disease. No liver mass identified. 2. Surgically absent gallbladder.  No common bile duct dilatation. 3. No other abnormalities are identified.  Electronically Signed   By: Gerome Sam III M.D.   On: 04/21/2022 14:18 Note: Reviewed        Physical Exam  General appearance: Well nourished, well developed, and well hydrated. In no apparent acute distress Mental status: Alert, oriented x 3 (person, place, & time)       Respiratory: No evidence of acute respiratory distress Eyes: PERLA Vitals: BP 129/81   Pulse 85   Temp (!) 97.2 F (36.2 C)   Resp 16   Ht 5\' 5"  (1.651 m)   Wt 153 lb (69.4 kg)   SpO2 99%   BMI 25.46 kg/m  BMI: Estimated body mass index is 25.46 kg/m as calculated from the following:   Height as of this encounter: 5\' 5"  (1.651 m).   Weight as of this  encounter: 153 lb (69.4 kg). Ideal: Ideal body weight: 61.5 kg (135 lb 9.3 oz) Adjusted ideal body weight: 64.7 kg (142 lb 8.8 oz)  Right hand swelling, bandage in place  Lumbar Spine Area Exam  Skin & Axial Inspection: No masses, redness, or swelling Alignment: Symmetrical Functional ROM: Pain restricted ROM       Stability: No instability detected Muscle Tone/Strength: Functionally intact. No obvious neuro-muscular anomalies detected. Sensory (Neurological): Dermatomal pain pattern right L3-L4  Gait & Posture Assessment  Ambulation: Limited Gait: Antalgic gait (limping) Posture: Difficulty standing up straight, due to pain  Lower Extremity Exam    Side: Right lower extremity  Side: Left lower extremity  Stability: No instability observed          Stability: No instability observed  Skin & Extremity Inspection: Skin color, temperature, and hair growth are WNL. No peripheral edema or cyanosis. No masses, redness, swelling, asymmetry, or associated skin lesions. No contractures.  Skin & Extremity Inspection: Skin color, temperature, and hair growth are WNL. No peripheral edema or cyanosis. No masses, redness, swelling, asymmetry, or associated skin lesions. No contractures.  Functional ROM: Pain restricted ROM. Positive straight leg raise test on the right          Functional ROM: Unrestricted ROM                  Muscle Tone/Strength: Functionally intact. No obvious neuro-muscular anomalies detected.  Muscle Tone/Strength: Functionally intact. No obvious neuro-muscular anomalies detected.  Sensory (Neurological): Dermatomal pain pattern        Sensory (Neurological): Unimpaired        DTR: Patellar: deferred today Achilles: deferred today Plantar: deferred today  DTR: Patellar: deferred today Achilles: deferred today Plantar: deferred today  Palpation: No palpable anomalies  Palpation: No palpable anomalies    Assessment   Diagnosis Status  1. Chronic radicular lumbar  pain   2. Spinal stenosis, lumbar region, with neurogenic claudication   3. Lumbar spondylosis   4. Closed nondisplaced fracture of second cervical vertebra, unspecified fracture morphology, sequela   5. History of bilateral knee replacement   6. Status post hip replacement, bilateral   7. Chronic pain syndrome     Persistent Persistent Persistent     Plan of Care  1. Chronic radicular lumbar pain  2. Spinal stenosis, lumbar region, with neurogenic claudication  3. Lumbar spondylosis  4. Closed nondisplaced fracture of second cervical vertebra, unspecified fracture morphology, sequela  5. History of bilateral knee replacement  6. Status post hip replacement, bilateral  7. Chronic pain syndrome  Requested Prescriptions   Signed Prescriptions Disp Refills   traMADol (ULTRAM) 50 MG tablet 120 tablet 2    Sig: Take 1 tablet (50 mg total) by mouth every 6 (six) hours as needed for severe pain.     Consider repeating L-ESI if radicular pain  flare  Follow-up plan:   Return in about 3 months (around 09/05/2022) for Medication Management, in person.     Interventional management options:  Considering: Occipital nerve block   C2-C3 medial branch nerve block Lumbar epidural steroid injection Lumbar facet medial branch nerve blocks S1-S3 lateral branch nerve blocks SI joint injection Sprint peripheral nerve stimulation   PRN Procedures:   None at this time       Recent Visits No visits were found meeting these conditions. Showing recent visits within past 90 days and meeting all other requirements Today's Visits Date Type Provider Dept  06/01/22 Office Visit Edward Jolly, MD [facility]  Showing today's visits and meeting all other requirements Future Appointments No visits were found meeting these conditions. Showing future appointments within next 90 days and meeting all other requirements  I discussed the assessment and treatment plan with the  patient. The patient was provided an opportunity to ask questions and all were answered. The patient agreed with the plan and demonstrated an understanding of the instructions.  Patient advised to call back or seek an in-person evaluation if the symptoms or condition worsens.  Duration of encounter: .  Total time on encounter, as per AMA guidelines included both the face-to-face and non-face-to-face time personally spent by the physician and/or other qualified health care professional(s) on the day of the encounter (includes time in activities that require the physician or  other qualified health care professional and does not include time in activities normally performed by clinical staff). Physician's time may include the following activities when performed: preparing to see the patient (eg, review of tests, pre-charting review of records) obtaining and/or reviewing separately obtained history performing a medically appropriate examination and/or evaluation counseling and educating the patient/family/caregiver ordering medications, tests, or procedures referring and communicating with other health care professionals (when not separately reported) documenting clinical information in the electronic or other health record independently interpreting results (not separately reported) and communicating results to the patient/ family/caregiver care coordination (not separately reported)  Note by: Edward Jolly, MD Date: 06/01/2022; Time: 11:37 AM

## 2022-06-01 NOTE — Progress Notes (Signed)
Nursing Pain Medication Assessment:  Safety precautions to be maintained throughout the outpatient stay will include: orient to surroundings, keep bed in low position, maintain call bell within reach at all times, provide assistance with transfer out of bed and ambulation.  Medication Inspection Compliance: Pill count conducted under aseptic conditions, in front of the patient. Neither the pills nor the bottle was removed from the patient's sight at any time. Once count was completed pills were immediately returned to the patient in their original bottle.  Medication: Tramadol (Ultram) Pill/Patch Count:  24 of 120 pills remain Pill/Patch Appearance: Markings consistent with prescribed medication Bottle Appearance: Standard pharmacy container. Clearly labeled. Filled Date: 5 / 2 / 2024 Last Medication intake:  Today

## 2022-06-14 ENCOUNTER — Telehealth: Payer: Self-pay

## 2022-06-14 NOTE — Telephone Encounter (Signed)
He said Dr. Cherylann Ratel was supposed to call out tramadol over a week ago and he still doesn't have it. He wants it sent to [facility].

## 2022-06-14 NOTE — Telephone Encounter (Signed)
Called [facility], they verified that the Rx for Tramadol is there. Patient notified.

## 2022-08-31 ENCOUNTER — Encounter: Payer: Self-pay | Admitting: Student in an Organized Health Care Education/Training Program

## 2022-08-31 ENCOUNTER — Ambulatory Visit
Payer: Medicare Other | Attending: Student in an Organized Health Care Education/Training Program | Admitting: Student in an Organized Health Care Education/Training Program

## 2022-08-31 VITALS — BP 122/74 | HR 90 | Temp 98.2°F | Resp 18 | Ht 66.0 in | Wt 150.0 lb

## 2022-08-31 DIAGNOSIS — M5481 Occipital neuralgia: Secondary | ICD-10-CM | POA: Insufficient documentation

## 2022-08-31 DIAGNOSIS — M5416 Radiculopathy, lumbar region: Secondary | ICD-10-CM | POA: Diagnosis present

## 2022-08-31 DIAGNOSIS — M47816 Spondylosis without myelopathy or radiculopathy, lumbar region: Secondary | ICD-10-CM | POA: Insufficient documentation

## 2022-08-31 DIAGNOSIS — G8929 Other chronic pain: Secondary | ICD-10-CM | POA: Insufficient documentation

## 2022-08-31 DIAGNOSIS — M48062 Spinal stenosis, lumbar region with neurogenic claudication: Secondary | ICD-10-CM | POA: Diagnosis not present

## 2022-08-31 MED ORDER — TRAMADOL HCL 50 MG PO TABS: 50.0000 mg | ORAL_TABLET | Freq: Four times a day (QID) | ORAL | 2 refills | Status: AC | PRN

## 2022-08-31 NOTE — Progress Notes (Signed)
PROVIDER NOTE: Information contained herein reflects review and annotations entered in association with encounter. Interpretation of such information and data should be left to medically-trained personnel. Information provided to patient can be located elsewhere in the medical record under "Patient Instructions". Document created using STT-dictation technology, any transcriptional errors that may result from process are unintentional.    Patient: Zachary Fernandez  Service Category: E/M  Provider: Edward Jolly, MD  DOB: Jan 29, 1940  DOS: 08/31/2022  Referring Provider: Mick Sell, MD  MRN: [id number]  Specialty: Interventional Pain Management  PCP: Mick Sell, MD  Type: Established Patient  Setting: Ambulatory outpatient    Location: Office  Delivery: Face-to-face     HPI  Mr. Ren Gladys, a 82 y.o. 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 Mr. Wilbur primary complain today is Sciatica (Left is worse) Last encounter: My last encounter with him was on 06/01/22 Pertinent problems: Mr. Sanders has Chronic SI joint pain; Status post hip replacement, bilateral; History of bilateral knee replacement; Spinal stenosis, lumbar region, with neurogenic claudication; Chronic radicular lumbar pain; Lumbar spondylosis; Chronic pain syndrome; Pain management contract signed; Whiplash injury to neck; and Closed nondisplaced fracture of second cervical vertebra (HCC) on their pertinent problem list. Pain Assessment: Severity of Chronic pain is reported as a 2 /10. Location:  (sciatic) Left/legs. Onset: More than a month ago. Quality: Stabbing. Timing: Intermittent. Modifying factor(s): medications. Vitals:  height is 5\' 6"  (1.676 m) and weight is 150 lb (68 kg). His temporal temperature is 98.2 F (36.8 C). His blood pressure is 122/74 and his pulse is 90. His respiration is 18 and oxygen saturation is 97%.   Reason for encounter: medication management. No  change in medical history since last visit.  Patient's pain is at baseline.  Patient continues multimodal pain regimen as prescribed.  States that it provides pain relief and improvement in functional status. States that he goes to the gym approximately 3 to 4 days out of the week to work out.  This helps him with his range of motion and also helps to manage his pain. He has been working with a physical therapist.  He notes occipital pain and neuralgia.  We discussed a occipital nerve block however the patient wants to continue working with his physical therapist. He has chronic low back and left leg pain related to lumbar radiculopathy.  Pharmacotherapy Assessment  Analgesic: Tramadol 50-100 mg BID prn   Monitoring: [place] PMP: PDMP reviewed during this encounter.       Pharmacotherapy: No side-effects or adverse reactions reported. Compliance: No problems identified. Effectiveness: Clinically acceptable.  Rickey Barbara, RN  08/31/2022 11:23 AM  Signed Nursing Pain Medication Assessment:  Safety precautions to be maintained throughout the outpatient stay will include: orient to surroundings, keep bed in low position, maintain call bell within reach at all times, provide assistance with transfer out of bed and ambulation.  Medication Inspection Compliance: Pill count conducted under aseptic conditions, in front of the patient. Neither the pills nor the bottle was removed from the patient's sight at any time. Once count was completed pills were immediately returned to the patient in their original bottle.  Medication: Tramadol (Ultram) Pill/Patch Count:  19 of 120 pills remain Pill/Patch Appearance: Markings consistent with prescribed medication Bottle Appearance: Standard pharmacy container. Clearly labeled. Filled Date: 08 / 02 / 2024 Last Medication intake:  Today    UDS:  Summary  Date Value Ref Range Status  02/16/2020 Note  Final    Comment:     ==================================================================== Compliance Drug Analysis, Ur ==================================================================== Test                             Result       Flag       Units  Drug Present and Declared for Prescription Verification   Tramadol                       >3247        EXPECTED   ng/mg creat   O-Desmethyltramadol            >3247        EXPECTED   ng/mg creat   N-Desmethyltramadol            964          EXPECTED   ng/mg creat    Source of tramadol is a prescription medication. O-desmethyltramadol    and N-desmethyltramadol are expected metabolites of tramadol.    Desipramine                    PRESENT      EXPECTED    Desipramine may be administered as a prescription drug; it is also    an expected metabolite of imipramine.  Drug Absent but Declared for Prescription Verification   Gabapentin                     Not Detected UNEXPECTED ==================================================================== Test                      Result    Flag   Units      Ref Range   Creatinine              154              mg/dL      >=16 ==================================================================== Declared Medications:  The flagging and interpretation on this report are based on the  following declared medications.  Unexpected results may arise from  inaccuracies in the declared medications.   **Note: The testing scope of this panel includes these medications:   Desipramine (Norpramin)  Gabapentin (Neurontin)  Tramadol (Ultram)   **Note: The testing scope of this panel does not include the  following reported medications:   Amlodipine (Norvasc)  Bisoprolol  Calcium  Hydrochlorothiazide  Multivitamin  Rosuvastatin (Crestor)  Supplement  Tamsulosin (Flomax)  Testosterone  Turmeric ==================================================================== For clinical consultation, please call [phone]. ====================================================================      ROS  Constitutional: Denies any fever or chills Gastrointestinal: No reported hemesis, hematochezia, vomiting, or acute GI distress Musculoskeletal:  Low back pain with radiation into right leg Neurological: No reported episodes of acute onset apraxia, aphasia, dysarthria, agnosia, amnesia, paralysis, loss of coordination, or loss of consciousness  Medication Review  Barberry-Oreg Grape-Goldenseal, Calcium Citrate, Multiple Vitamins-Minerals, Testosterone, Turmeric, amLODipine, bisoprolol-hydrochlorothiazide, cyclobenzaprine, desipramine, folic acid, rosuvastatin, tamsulosin, thiamine, and traMADol  History Review  Allergy: Mr. Bethell is allergic to other. Drug: Mr. Dreibelbis  reports no history of drug use. Alcohol:  reports current alcohol use. Tobacco:  reports that he has never smoked. He has never used smokeless tobacco. Social: Mr. Denning  reports that he has never smoked. He has never used smokeless tobacco. He reports current alcohol use. He reports that he does not use drugs. Medical:  has a  past medical history of EtOH dependence (HCC), HLD (hyperlipidemia), and Hypertension. Surgical: Mr. Grumbles  has a past surgical history that includes Revision total hip arthroplasty (Bilateral); Knee Arthroplasty (Left); Shoulder surgery (Left); Cholecystectomy; and Hip Closed Reduction (Right, 12/12/2021). Family: family history includes Diabetes in his brother.  Laboratory Chemistry Profile   Renal Lab Results  Component Value Date   BUN 10 12/13/2021   CREATININE 0.55 (L) 12/13/2021   GFRNONAA >60 12/13/2021    Hepatic Lab Results  Component Value Date   AST 37 12/12/2021   ALT 23 12/12/2021   ALBUMIN 3.9 12/12/2021   ALKPHOS 89 12/12/2021    Electrolytes Lab Results  Component Value Date   NA 138 12/13/2021   K 3.8 12/13/2021   CL 106 12/13/2021   CALCIUM 8.2 (L) 12/13/2021    Bone No results  found for: "VD25OH", "VD125OH2TOT", "VH8469GE9", "BM8413KG4", "25OHVITD1", "25OHVITD2", "25OHVITD3", "TESTOFREE", "TESTOSTERONE"  Inflammation (CRP: Acute Phase) (ESR: Chronic Phase) No results found for: "CRP", "ESRSEDRATE", "LATICACIDVEN"       Note: Above Lab results reviewed.  Recent Imaging Review  US Abdomen Complete CLINICAL DATA:  Abdominal distension. Hepatic steatosis. Nodular liver on the May 14, 2020 CT scan.  EXAM: ABDOMEN ULTRASOUND COMPLETE  COMPARISON:  CT scan of the abdomen and pelvis May 14, 2020  FINDINGS: Gallbladder: Surgically absent  Common bile duct: Diameter: 6 mm  Liver: Diffuse increased echogenicity. No mass identified. Portal vein is patent on color Doppler imaging with normal direction of blood flow towards the liver.  IVC: No abnormality visualized.  Pancreas: Visualized portion unremarkable.  Spleen: Size and appearance within normal limits.  Right Kidney: Length: 10.8 cm. Echogenicity within normal limits. No mass or hydronephrosis visualized.  Left Kidney: Length: 11 cm. Echogenicity within normal limits. No mass or hydronephrosis visualized.  Abdominal aorta: No aneurysm visualized.  Other findings: None.  IMPRESSION: 1. Diffuse increased echogenicity throughout the liver is nonspecific and may be seen with hepatic steatosis or other underlying intrinsic liver disease. No liver mass identified. 2. Surgically absent gallbladder.  No common bile duct dilatation. 3. No other abnormalities are identified.  Electronically Signed   By: Gerome Sam III M.D.   On: 04/21/2022 14:18 Note: Reviewed        Physical Exam  General appearance: Well nourished, well developed, and well hydrated. In no apparent acute distress Mental status: Alert, oriented x 3 (person, place, & time)       Respiratory: No evidence of acute respiratory distress Eyes: PERLA Vitals: BP 122/74   Pulse 90   Temp 98.2 F (36.8 C) (Temporal)   Resp 18   Ht  5\' 6"  (1.676 m)   Wt 150 lb (68 kg)   SpO2 97%   BMI 24.21 kg/m  BMI: Estimated body mass index is 24.21 kg/m as calculated from the following:   Height as of this encounter: 5\' 6"  (1.676 m).   Weight as of this encounter: 150 lb (68 kg). Ideal: Ideal body weight: 63.8 kg (140 lb 10.5 oz) Adjusted ideal body weight: 65.5 kg (144 lb 6.3 oz)  Occipital pain  Lumbar Spine Area Exam  Skin & Axial Inspection: No masses, redness, or swelling Alignment: Symmetrical Functional ROM: Pain restricted ROM       Stability: No instability detected Muscle Tone/Strength: Functionally intact. No obvious neuro-muscular anomalies detected. Sensory (Neurological): Dermatomal pain pattern right L3-L4  Gait & Posture Assessment  Ambulation: Limited Gait: Antalgic gait (limping) Posture: Difficulty standing up straight, due to pain  Lower Extremity Exam    Side: Right lower extremity  Side: Left lower extremity  Stability: No instability observed          Stability: No instability observed          Skin & Extremity Inspection: Skin color, temperature, and hair growth are WNL. No peripheral edema or cyanosis. No masses, redness, swelling, asymmetry, or associated skin lesions. No contractures.  Skin & Extremity Inspection: Skin color, temperature, and hair growth are WNL. No peripheral edema or cyanosis. No masses, redness, swelling, asymmetry, or associated skin lesions. No contractures.  Functional ROM: Pain restricted ROM. Positive straight leg raise test on the right          Functional ROM: Unrestricted ROM                  Muscle Tone/Strength: Functionally intact. No obvious neuro-muscular anomalies detected.  Muscle Tone/Strength: Functionally intact. No obvious neuro-muscular anomalies detected.  Sensory (Neurological): Dermatomal pain pattern        Sensory (Neurological): Unimpaired        DTR: Patellar: deferred today Achilles: deferred today Plantar: deferred today  DTR: Patellar:  deferred today Achilles: deferred today Plantar: deferred today  Palpation: No palpable anomalies  Palpation: No palpable anomalies    Assessment   Diagnosis Status  1. Chronic radicular lumbar pain   2. Spinal stenosis, lumbar region, with neurogenic claudication   3. Lumbar spondylosis   4. Bilateral occipital neuralgia      Controlled Controlled Controlled     Plan of Care  1. Chronic radicular lumbar pain  2. Spinal stenosis, lumbar region, with neurogenic claudication  3. Lumbar spondylosis  4. Bilateral occipital neuralgia   Requested Prescriptions   Signed Prescriptions Disp Refills   traMADol (ULTRAM) 50 MG tablet 120 tablet 2    Sig: Take 1 tablet (50 mg total) by mouth every 6 (six) hours as needed for severe pain.     Consider repeating L-ESI if radicular pain  flare Consider occipital nerve block  Follow-up plan:   Return in about 3 months (around 12/01/2022) for Medication Management, in person.     Interventional management options:  Considering: Occipital nerve block   C2-C3 medial branch nerve block Lumbar epidural steroid injection Lumbar facet medial branch nerve blocks S1-S3 lateral branch nerve blocks SI joint injection Sprint peripheral nerve stimulation   PRN Procedures:   None at this time       Recent Visits No visits were found meeting these conditions. Showing recent visits within past 90 days and meeting all other requirements Today's Visits Date Type Provider Dept  08/31/22 Office Visit Edward Jolly, MD [facility]  Showing today's visits and meeting all other requirements Future Appointments Date Type Provider Dept  11/23/22 Appointment Edward Jolly, MD [facility]  Showing future appointments within next 90 days and meeting all other requirements  I discussed the assessment and treatment plan with the patient. The patient was provided an opportunity to ask questions and all were answered. The  patient agreed with the plan and demonstrated an understanding of the instructions.  Patient advised to call back or seek an in-person evaluation if the symptoms or condition worsens.  Duration of encounter: .  Total time on encounter, as per AMA guidelines included both the face-to-face and non-face-to-face time personally spent by the physician and/or other qualified health care professional(s) on the day of the encounter (includes time in activities that require the physician or other qualified  health care professional and does not include time in activities normally performed by clinical staff). Physician's time may include the following activities when performed: preparing to see the patient (eg, review of tests, pre-charting review of records) obtaining and/or reviewing separately obtained history performing a medically appropriate examination and/or evaluation counseling and educating the patient/family/caregiver ordering medications, tests, or procedures referring and communicating with other health care professionals (when not separately reported) documenting clinical information in the electronic or other health record independently interpreting results (not separately reported) and communicating results to the patient/ family/caregiver care coordination (not separately reported)  Note by: Edward Jolly, MD Date: 08/31/2022; Time: 11:57 AM

## 2022-08-31 NOTE — Progress Notes (Signed)
Nursing Pain Medication Assessment:  Safety precautions to be maintained throughout the outpatient stay will include: orient to surroundings, keep bed in low position, maintain call bell within reach at all times, provide assistance with transfer out of bed and ambulation.  Medication Inspection Compliance: Pill count conducted under aseptic conditions, in front of the patient. Neither the pills nor the bottle was removed from the patient's sight at any time. Once count was completed pills were immediately returned to the patient in their original bottle.  Medication: Tramadol (Ultram) Pill/Patch Count:  19 of 120 pills remain Pill/Patch Appearance: Markings consistent with prescribed medication Bottle Appearance: Standard pharmacy container. Clearly labeled. Filled Date: 08 / 02 / 2024 Last Medication intake:  Today

## 2022-08-31 NOTE — Patient Instructions (Signed)

## 2022-10-21 ENCOUNTER — Other Ambulatory Visit: Payer: Self-pay

## 2022-10-21 ENCOUNTER — Emergency Department
Admission: EM | Admit: 2022-10-21 | Discharge: 2022-10-21 | Disposition: A | Discharge status: Home / Self Care | Payer: Medicare Other | Attending: Emergency Medicine | Admitting: Emergency Medicine

## 2022-10-21 ENCOUNTER — Emergency Department: Payer: Medicare Other

## 2022-10-21 DIAGNOSIS — W19XXXA Unspecified fall, initial encounter: Secondary | ICD-10-CM

## 2022-10-21 DIAGNOSIS — W01198A Fall on same level from slipping, tripping and stumbling with subsequent striking against other object, initial encounter: Secondary | ICD-10-CM | POA: Diagnosis not present

## 2022-10-21 DIAGNOSIS — S20211A Contusion of right front wall of thorax, initial encounter: Secondary | ICD-10-CM | POA: Insufficient documentation

## 2022-10-21 DIAGNOSIS — M25511 Pain in right shoulder: Secondary | ICD-10-CM | POA: Diagnosis not present

## 2022-10-21 DIAGNOSIS — R079 Chest pain, unspecified: Secondary | ICD-10-CM | POA: Diagnosis present

## 2022-10-21 DIAGNOSIS — I10 Essential (primary) hypertension: Secondary | ICD-10-CM | POA: Insufficient documentation

## 2022-10-21 LAB — URINALYSIS, ROUTINE W REFLEX MICROSCOPIC
Bilirubin Urine: NEGATIVE
Glucose, UA: NEGATIVE mg/dL
Hgb urine dipstick: NEGATIVE
Ketones, ur: NEGATIVE mg/dL
Leukocytes,Ua: NEGATIVE
Nitrite: NEGATIVE
Protein, ur: NEGATIVE mg/dL
Specific Gravity, Urine: 1.016 (ref 1.005–1.030)
pH: 5 (ref 5.0–8.0)

## 2022-10-21 NOTE — Discharge Instructions (Addendum)
Your chest rate and urine test were both normal today.  Please follow-up with your doctor this week for continued monitoring of your symptoms.

## 2022-10-21 NOTE — ED Provider Notes (Signed)
[facility] Provider Note    Event Date/Time   First MD Initiated Contact with Patient 10/21/22 1135     (approximate)   History   Fall   HPI  Zachary Fernandez is a 82 y.o. male with history of hypertension, hyperlipidemia, EtOH dependence presents emergency department complaining of right shoulder pain and right sided chest tenderness.  Patient got up around 2 AM to go to the bathroom and hit the shoulder on the edge of the tub.  Did not hit his head.  No LOC.  Is denying chest pain or shortness of breath.  States just hurts at the site of injury and when he tries to take a deep breath.  his family member is also concerned he may have a UTI.      Physical Exam   Triage Vital Signs: ED Triage Vitals  Encounter Vitals Group     BP 10/21/22 0949 (!) 107/59     Systolic BP Percentile --      Diastolic BP Percentile --      Pulse Rate 10/21/22 0949 82     Resp 10/21/22 0949 17     Temp 10/21/22 0949 98.1 F (36.7 C)     Temp Source 10/21/22 0949 Oral     SpO2 10/21/22 0949 95 %     Weight 10/21/22 0950 152 lb (68.9 kg)     Height 10/21/22 0950 5\' 4"  (1.626 m)     Head Circumference --      Peak Flow --      Pain Score 10/21/22 0950 7     Pain Loc --      Pain Education --      Exclude from Growth Chart --     Most recent vital signs: Vitals:   10/21/22 0949  BP: (!) 107/59  Pulse: 82  Resp: 17  Temp: 98.1 F (36.7 C)  SpO2: 95%     General: Awake, no distress.   CV:  Good peripheral perfusion. regular rate and  rhythm Resp:  Normal effort. Lungs CTA, right side of the upper ribs tender to palpation, no crepitus noted, sternum is nontender Abd:  No distention.   Other:      ED Results / Procedures / Treatments   Labs (all labs ordered are listed, but only abnormal results are displayed) Labs Reviewed  URINALYSIS, ROUTINE W REFLEX MICROSCOPIC     EKG     RADIOLOGY Chest  x-ray    PROCEDURES:   Procedures   MEDICATIONS ORDERED IN ED: Medications - No data to display   IMPRESSION / MDM / ASSESSMENT AND PLAN / ED COURSE  I reviewed the triage vital signs and the nursing notes.                              Differential diagnosis includes, but is not limited to, rib fracture, chest wall contusion, fall, UTI  Patient's presentation is most consistent with acute illness / injury with system symptoms.   Chest x-ray independently reviewed interpreted by me as being negative for any acute abnormality  UA ordered      FINAL CLINICAL IMPRESSION(S) / ED DIAGNOSES   Final diagnoses:  Fall, initial encounter  Contusion of right chest wall, initial encounter     Rx / DC Orders   ED Discharge Orders     None        Note:  This document was  prepared using Conservation officer, historic buildings and may include unintentional dictation errors.    Faythe Ghee, PA-C 10/21/22 1148    Sharman Cheek, MD 10/21/22 [phone]

## 2022-10-21 NOTE — ED Triage Notes (Signed)
Pt sts that he fell off the toilet this AM. Pt sts that he landed into the tub and is having pain with breathing.

## 2022-10-27 ENCOUNTER — Ambulatory Visit
Admission: RE | Admit: 2022-10-27 | Discharge: 2022-10-27 | Disposition: A | Discharge status: Home / Self Care | Payer: Medicare Other | Source: Ambulatory Visit | Attending: Physician Assistant | Admitting: Physician Assistant

## 2022-10-27 ENCOUNTER — Other Ambulatory Visit: Payer: Self-pay | Admitting: Physician Assistant

## 2022-10-27 DIAGNOSIS — R0609 Other forms of dyspnea: Secondary | ICD-10-CM | POA: Diagnosis present

## 2022-10-27 DIAGNOSIS — W19XXXD Unspecified fall, subsequent encounter: Secondary | ICD-10-CM | POA: Insufficient documentation

## 2022-10-27 DIAGNOSIS — I2510 Atherosclerotic heart disease of native coronary artery without angina pectoris: Secondary | ICD-10-CM | POA: Insufficient documentation

## 2022-10-27 DIAGNOSIS — R0781 Pleurodynia: Secondary | ICD-10-CM

## 2022-10-27 DIAGNOSIS — R1011 Right upper quadrant pain: Secondary | ICD-10-CM

## 2022-10-27 DIAGNOSIS — Y92009 Unspecified place in unspecified non-institutional (private) residence as the place of occurrence of the external cause: Secondary | ICD-10-CM | POA: Insufficient documentation

## 2022-10-27 DIAGNOSIS — K5730 Diverticulosis of large intestine without perforation or abscess without bleeding: Secondary | ICD-10-CM | POA: Insufficient documentation

## 2022-10-27 DIAGNOSIS — I700 Atherosclerosis of aorta: Secondary | ICD-10-CM | POA: Insufficient documentation

## 2022-10-27 DIAGNOSIS — J90 Pleural effusion, not elsewhere classified: Secondary | ICD-10-CM | POA: Insufficient documentation

## 2022-10-27 MED ORDER — IOHEXOL 300 MG/ML  SOLN
100.0000 mL | Freq: Once | INTRAMUSCULAR | Status: AC | PRN
  Administered 2022-10-27: 100 mL via INTRAVENOUS

## 2022-11-18 ENCOUNTER — Other Ambulatory Visit: Payer: Self-pay

## 2022-11-18 ENCOUNTER — Encounter: Payer: Self-pay | Admitting: Emergency Medicine

## 2022-11-18 ENCOUNTER — Emergency Department
Admission: EM | Admit: 2022-11-18 | Discharge: 2022-11-18 | Disposition: A | Discharge status: Home / Self Care | Payer: Medicare Other | Attending: Emergency Medicine | Admitting: Emergency Medicine

## 2022-11-18 ENCOUNTER — Emergency Department: Payer: Medicare Other

## 2022-11-18 DIAGNOSIS — M25531 Pain in right wrist: Secondary | ICD-10-CM

## 2022-11-18 LAB — CBC WITH DIFFERENTIAL/PLATELET
Abs Immature Granulocytes: 0.04 10*3/uL (ref 0.00–0.07)
Basophils Absolute: 0 10*3/uL (ref 0.0–0.1)
Basophils Relative: 0 %
Eosinophils Absolute: 0.2 10*3/uL (ref 0.0–0.5)
Eosinophils Relative: 2 %
HCT: 41.7 % (ref 39.0–52.0)
Hemoglobin: 14.2 g/dL (ref 13.0–17.0)
Immature Granulocytes: 0 %
Lymphocytes Relative: 12 %
Lymphs Abs: 1.2 10*3/uL (ref 0.7–4.0)
MCH: 32.7 pg (ref 26.0–34.0)
MCHC: 34.1 g/dL (ref 30.0–36.0)
MCV: 96.1 fL (ref 80.0–100.0)
Monocytes Absolute: 1 10*3/uL (ref 0.1–1.0)
Monocytes Relative: 11 %
Neutro Abs: 7 10*3/uL (ref 1.7–7.7)
Neutrophils Relative %: 75 %
Platelets: 139 10*3/uL — ABNORMAL LOW (ref 150–400)
RBC: 4.34 MIL/uL (ref 4.22–5.81)
RDW: 12.5 % (ref 11.5–15.5)
WBC: 9.3 10*3/uL (ref 4.0–10.5)
nRBC: 0 % (ref 0.0–0.2)

## 2022-11-18 LAB — BASIC METABOLIC PANEL
Anion gap: 11 (ref 5–15)
BUN: 15 mg/dL (ref 8–23)
CO2: 26 mmol/L (ref 22–32)
Calcium: 8.9 mg/dL (ref 8.9–10.3)
Chloride: 96 mmol/L — ABNORMAL LOW (ref 98–111)
Creatinine, Ser: 0.73 mg/dL (ref 0.61–1.24)
GFR, Estimated: >60 mL/min (ref >60)
Glucose, Bld: 116 mg/dL — ABNORMAL HIGH (ref 70–99)
Potassium: 3.9 mmol/L (ref 3.5–5.1)
Sodium: 133 mmol/L — ABNORMAL LOW (ref 135–145)

## 2022-11-18 LAB — URIC ACID: Uric Acid, Serum: 5.7 mg/dL (ref 3.7–8.6)

## 2022-11-18 MED ORDER — PREDNISONE 10 MG PO TABS: ORAL_TABLET | ORAL | 0 refills | Status: DC

## 2022-11-18 NOTE — ED Triage Notes (Signed)
Pt reports pain to his right wrist for the past 24 hours. Pt reports also started with some swelling. Pt reports no injuries or falls. Pt reports he did do some exercise on Thursday but states that he has been exercising weekly for his entire life. Pt reports does have some arthritis but it has never swelled.

## 2022-11-18 NOTE — ED Provider Notes (Signed)
[facility] Provider Note    Event Date/Time   First MD Initiated Contact with Patient 11/18/22 [phone]     (approximate)   History   Wrist Pain   HPI  Zachary Fernandez is a 82 y.o. male   presents to the ED with complaint of right wrist pain without known injury.  Patient reports pain has gotten worse in the last 24 hours however wife states that she gave him 2 ibuprofen last evening and patient reports that he slept without pain last night.  Patient has a history of chronic pain, low back pain, pretension, BPH, arthritis, EtOH dependence.      Physical Exam   Triage Vital Signs: ED Triage Vitals [11/18/22 0938]  Encounter Vitals Group     BP      Systolic BP Percentile      Diastolic BP Percentile      Pulse      Resp      Temp      Temp src      SpO2      Weight 152 lb 1.9 oz (69 kg)     Height 5\' 4"  (1.626 m)     Head Circumference      Peak Flow      Pain Score 8     Pain Loc      Pain Education      Exclude from Growth Chart     Most recent vital signs: Vitals:   11/18/22 0955  BP: 122/61  Pulse: 88  Temp: 97.8 F (36.6 C)  SpO2: 96%     General: Awake, no distress.  CV:  Good peripheral perfusion.  Resp:  Normal effort.  Abd:  No distention.  Other:  Right wrist without gross deformity however there is moderate degenerative changes along with soft tissue edema and erythema.  Range of motion is restricted secondary to increased pain.  Radial pulses present.  Skin is intact.  Sensory function intact distally.   ED Results / Procedures / Treatments   Labs (all labs ordered are listed, but only abnormal results are displayed) Labs Reviewed  CBC WITH DIFFERENTIAL/PLATELET - Abnormal; Notable for the following components:      Result Value   Platelets 139 (*)    All other components within normal limits  BASIC METABOLIC PANEL - Abnormal; Notable for the following components:   Sodium 133 (*)    Chloride 96 (*)    Glucose,  Bld 116 (*)    All other components within normal limits  URIC ACID      RADIOLOGY  Right wrist x-ray images were reviewed by myself independent of the radiologist with degenerative changes noted and a ulnar styloid fracture appearing old.   PROCEDURES:  Critical Care performed:   Procedures   MEDICATIONS ORDERED IN ED: Medications - No data to display   IMPRESSION / MDM / ASSESSMENT AND PLAN / ED COURSE  I reviewed the triage vital signs and the nursing notes.   Differential diagnosis includes, but is not limited to, gout, degenerative joint disease, wrist sprain, cellulitis considered, fracture, subluxation.  82 year old male presents to the ED with complaint of right wrist pain, erythema and swelling without history of injury.  X-ray shows moderate degenerative changes and suggests scapholunate advanced collapse.  Lab work was reassuring and patient was made aware.  We discussed anti-inflammatories and because of his age we decided that a short course of steroids may be safer.  Was placed in  a wrist splint for support and protection.  Prednisone 3 tablets once a day for the next 5 days was sent to the pharmacy for him to begin taking and he is to follow-up with his PCP or [facility] clinic orthopedic department if not improving.      Patient's presentation is most consistent with acute complicated illness / injury requiring diagnostic workup.  FINAL CLINICAL IMPRESSION(S) / ED DIAGNOSES   Final diagnoses:  Acute pain of right wrist     Rx / DC Orders   ED Discharge Orders          Ordered    predniSONE (DELTASONE) 10 MG tablet        11/18/22 1113             Note:  This document was prepared using Dragon voice recognition software and may include unintentional dictation errors.   Tommi Rumps, PA-C 11/18/22 1501    Corena Herter, MD 11/21/22 [phone]

## 2022-11-18 NOTE — Discharge Instructions (Addendum)
Follow-up with your primary care provider or [facility] orthopedic department if any continued problems with your wrist.  Continue with your regular medications.  A prescription for steroids was sent to the pharmacy for you to take for the next 5 days.  Ice or heat to the joint as needed and elevate.  Wear the wrist splint for protection and support.

## 2022-11-20 DIAGNOSIS — M19031 Primary osteoarthritis, right wrist: Secondary | ICD-10-CM | POA: Insufficient documentation

## 2022-11-23 ENCOUNTER — Encounter: Payer: TRICARE For Life (TFL) | Admitting: Student in an Organized Health Care Education/Training Program

## 2022-12-10 ENCOUNTER — Other Ambulatory Visit: Payer: Self-pay

## 2022-12-10 ENCOUNTER — Emergency Department: Payer: Medicare Other

## 2022-12-10 ENCOUNTER — Inpatient Hospital Stay
Admission: EM | Admit: 2022-12-10 | Discharge: 2022-12-20 | DRG: 552 | Disposition: A | Discharge status: Skilled Nursing Facility | Payer: Medicare Other | Attending: Student | Admitting: Student

## 2022-12-10 DIAGNOSIS — I10 Essential (primary) hypertension: Secondary | ICD-10-CM

## 2022-12-10 DIAGNOSIS — I452 Bifascicular block: Secondary | ICD-10-CM | POA: Diagnosis present

## 2022-12-10 DIAGNOSIS — F32A Depression, unspecified: Secondary | ICD-10-CM | POA: Diagnosis present

## 2022-12-10 DIAGNOSIS — E871 Hypo-osmolality and hyponatremia: Secondary | ICD-10-CM | POA: Diagnosis present

## 2022-12-10 DIAGNOSIS — Z833 Family history of diabetes mellitus: Secondary | ICD-10-CM | POA: Diagnosis not present

## 2022-12-10 DIAGNOSIS — H9190 Unspecified hearing loss, unspecified ear: Secondary | ICD-10-CM | POA: Diagnosis present

## 2022-12-10 DIAGNOSIS — W19XXXA Unspecified fall, initial encounter: Secondary | ICD-10-CM | POA: Diagnosis present

## 2022-12-10 DIAGNOSIS — F10939 Alcohol use, unspecified with withdrawal, unspecified: Secondary | ICD-10-CM | POA: Diagnosis present

## 2022-12-10 DIAGNOSIS — Z96652 Presence of left artificial knee joint: Secondary | ICD-10-CM | POA: Diagnosis present

## 2022-12-10 DIAGNOSIS — G8929 Other chronic pain: Secondary | ICD-10-CM | POA: Diagnosis present

## 2022-12-10 DIAGNOSIS — M549 Dorsalgia, unspecified: Secondary | ICD-10-CM | POA: Diagnosis present

## 2022-12-10 DIAGNOSIS — F10231 Alcohol dependence with withdrawal delirium: Secondary | ICD-10-CM | POA: Diagnosis present

## 2022-12-10 DIAGNOSIS — Z79899 Other long term (current) drug therapy: Secondary | ICD-10-CM

## 2022-12-10 DIAGNOSIS — Z974 Presence of external hearing-aid: Secondary | ICD-10-CM

## 2022-12-10 DIAGNOSIS — N400 Enlarged prostate without lower urinary tract symptoms: Secondary | ICD-10-CM | POA: Diagnosis present

## 2022-12-10 DIAGNOSIS — J9811 Atelectasis: Secondary | ICD-10-CM | POA: Diagnosis present

## 2022-12-10 DIAGNOSIS — Y92009 Unspecified place in unspecified non-institutional (private) residence as the place of occurrence of the external cause: Secondary | ICD-10-CM

## 2022-12-10 DIAGNOSIS — K5900 Constipation, unspecified: Secondary | ICD-10-CM | POA: Diagnosis not present

## 2022-12-10 DIAGNOSIS — S22079A Unspecified fracture of T9-T10 vertebra, initial encounter for closed fracture: Principal | ICD-10-CM | POA: Diagnosis present

## 2022-12-10 DIAGNOSIS — F1010 Alcohol abuse, uncomplicated: Secondary | ICD-10-CM | POA: Diagnosis not present

## 2022-12-10 DIAGNOSIS — Z96643 Presence of artificial hip joint, bilateral: Secondary | ICD-10-CM | POA: Diagnosis present

## 2022-12-10 DIAGNOSIS — F10921 Alcohol use, unspecified with intoxication delirium: Secondary | ICD-10-CM | POA: Diagnosis not present

## 2022-12-10 DIAGNOSIS — M8588 Other specified disorders of bone density and structure, other site: Secondary | ICD-10-CM | POA: Diagnosis present

## 2022-12-10 DIAGNOSIS — S2241XA Multiple fractures of ribs, right side, initial encounter for closed fracture: Secondary | ICD-10-CM | POA: Diagnosis present

## 2022-12-10 DIAGNOSIS — Z79890 Hormone replacement therapy (postmenopausal): Secondary | ICD-10-CM | POA: Diagnosis not present

## 2022-12-10 DIAGNOSIS — E876 Hypokalemia: Secondary | ICD-10-CM | POA: Diagnosis not present

## 2022-12-10 DIAGNOSIS — S22078A Other fracture of T9-T10 vertebra, initial encounter for closed fracture: Principal | ICD-10-CM

## 2022-12-10 DIAGNOSIS — E785 Hyperlipidemia, unspecified: Secondary | ICD-10-CM | POA: Diagnosis present

## 2022-12-10 DIAGNOSIS — D72829 Elevated white blood cell count, unspecified: Secondary | ICD-10-CM | POA: Diagnosis present

## 2022-12-10 DIAGNOSIS — S2231XA Fracture of one rib, right side, initial encounter for closed fracture: Secondary | ICD-10-CM | POA: Diagnosis present

## 2022-12-10 DIAGNOSIS — S22009A Unspecified fracture of unspecified thoracic vertebra, initial encounter for closed fracture: Secondary | ICD-10-CM | POA: Diagnosis present

## 2022-12-10 LAB — CBC WITH DIFFERENTIAL/PLATELET
Abs Immature Granulocytes: 0.02 10*3/uL (ref 0.00–0.07)
Basophils Absolute: 0 10*3/uL (ref 0.0–0.1)
Basophils Relative: 0 %
Eosinophils Absolute: 0.2 10*3/uL (ref 0.0–0.5)
Eosinophils Relative: 3 %
HCT: 37.1 % — ABNORMAL LOW (ref 39.0–52.0)
Hemoglobin: 12.6 g/dL — ABNORMAL LOW (ref 13.0–17.0)
Immature Granulocytes: 0 %
Lymphocytes Relative: 11 %
Lymphs Abs: 0.8 10*3/uL (ref 0.7–4.0)
MCH: 33.6 pg (ref 26.0–34.0)
MCHC: 34 g/dL (ref 30.0–36.0)
MCV: 98.9 fL (ref 80.0–100.0)
Monocytes Absolute: 0.7 10*3/uL (ref 0.1–1.0)
Monocytes Relative: 11 %
Neutro Abs: 5.1 10*3/uL (ref 1.7–7.7)
Neutrophils Relative %: 75 %
Platelets: 123 10*3/uL — ABNORMAL LOW (ref 150–400)
RBC: 3.75 MIL/uL — ABNORMAL LOW (ref 4.22–5.81)
RDW: 13.2 % (ref 11.5–15.5)
WBC: 6.9 10*3/uL (ref 4.0–10.5)
nRBC: 0 % (ref 0.0–0.2)

## 2022-12-10 LAB — COMPREHENSIVE METABOLIC PANEL
ALT: 21 U/L (ref 0–44)
AST: 27 U/L (ref 15–41)
Albumin: 3.9 g/dL (ref 3.5–5.0)
Alkaline Phosphatase: 92 U/L (ref 38–126)
Anion gap: 8 (ref 5–15)
BUN: 19 mg/dL (ref 8–23)
CO2: 27 mmol/L (ref 22–32)
Calcium: 8.6 mg/dL — ABNORMAL LOW (ref 8.9–10.3)
Chloride: 97 mmol/L — ABNORMAL LOW (ref 98–111)
Creatinine, Ser: 0.81 mg/dL (ref 0.61–1.24)
GFR, Estimated: >60 mL/min (ref >60)
Glucose, Bld: 101 mg/dL — ABNORMAL HIGH (ref 70–99)
Potassium: 4.1 mmol/L (ref 3.5–5.1)
Sodium: 132 mmol/L — ABNORMAL LOW (ref 135–145)
Total Bilirubin: 1.1 mg/dL (ref <1.2)
Total Protein: 6.8 g/dL (ref 6.5–8.1)

## 2022-12-10 LAB — CK: Total CK: 34 U/L — ABNORMAL LOW (ref 49–397)

## 2022-12-10 MED ORDER — THIAMINE HCL 100 MG/ML IJ SOLN
100.0000 mg | Freq: Every day | INTRAMUSCULAR | Status: DC
  Administered 2022-12-12 – 2022-12-13 (×2): 100 mg via INTRAVENOUS
  Filled 2022-12-10 (×2): qty 2

## 2022-12-10 MED ORDER — METHOCARBAMOL 500 MG PO TABS
500.0000 mg | ORAL_TABLET | Freq: Three times a day (TID) | ORAL | Status: DC | PRN
  Administered 2022-12-10: 500 mg via ORAL
  Filled 2022-12-10: qty 1

## 2022-12-10 MED ORDER — OXYCODONE-ACETAMINOPHEN 5-325 MG PO TABS
1.0000 | ORAL_TABLET | ORAL | Status: DC | PRN
  Administered 2022-12-10: 1 via ORAL
  Filled 2022-12-10: qty 1

## 2022-12-10 MED ORDER — LORAZEPAM 1 MG PO TABS: 1.0000 mg | ORAL_TABLET | ORAL | Status: AC | PRN

## 2022-12-10 MED ORDER — LORAZEPAM 2 MG/ML IJ SOLN
1.0000 mg | INTRAMUSCULAR | Status: AC | PRN
  Administered 2022-12-11 (×4): 2 mg via INTRAVENOUS
  Administered 2022-12-11: 1 mg via INTRAVENOUS
  Administered 2022-12-12 – 2022-12-13 (×6): 2 mg via INTRAVENOUS
  Filled 2022-12-10 (×11): qty 1

## 2022-12-10 MED ORDER — FOLIC ACID 1 MG PO TABS
1.0000 mg | ORAL_TABLET | Freq: Every day | ORAL | Status: DC
  Administered 2022-12-10: 1 mg via ORAL
  Filled 2022-12-10: qty 1

## 2022-12-10 MED ORDER — THIAMINE MONONITRATE 100 MG PO TABS
100.0000 mg | ORAL_TABLET | Freq: Every day | ORAL | Status: DC
  Administered 2022-12-10 – 2022-12-20 (×8): 100 mg via ORAL
  Filled 2022-12-10 (×9): qty 1

## 2022-12-10 MED ORDER — ADULT MULTIVITAMIN W/MINERALS CH
1.0000 | ORAL_TABLET | Freq: Every day | ORAL | Status: DC
  Administered 2022-12-10 – 2022-12-11 (×2): 1 via ORAL
  Filled 2022-12-10 (×2): qty 1

## 2022-12-10 MED ORDER — HYDRALAZINE HCL 20 MG/ML IJ SOLN: 5.0000 mg | INTRAMUSCULAR | Status: DC | PRN

## 2022-12-10 MED ORDER — ACETAMINOPHEN 325 MG PO TABS
650.0000 mg | ORAL_TABLET | Freq: Four times a day (QID) | ORAL | Status: DC | PRN
  Administered 2022-12-14 – 2022-12-15 (×2): 650 mg via ORAL
  Filled 2022-12-10 (×3): qty 2

## 2022-12-10 MED ORDER — ACETAMINOPHEN 325 MG PO TABS
650.0000 mg | ORAL_TABLET | Freq: Once | ORAL | Status: AC
  Administered 2022-12-10: 650 mg via ORAL
  Filled 2022-12-10: qty 2

## 2022-12-10 MED ORDER — LIDOCAINE 5 % EX PTCH
1.0000 | MEDICATED_PATCH | CUTANEOUS | Status: DC
  Administered 2022-12-10 – 2022-12-20 (×11): 1 via TRANSDERMAL
  Filled 2022-12-10 (×11): qty 1

## 2022-12-10 MED ORDER — MORPHINE SULFATE (PF) 2 MG/ML IV SOLN: 2.0000 mg | INTRAVENOUS | Status: DC | PRN

## 2022-12-10 MED ORDER — LIDOCAINE 5 % EX PTCH: 1.0000 | MEDICATED_PATCH | Freq: Two times a day (BID) | CUTANEOUS | 0 refills | Status: DC

## 2022-12-10 MED ORDER — OXYCODONE HCL 5 MG PO TABS
5.0000 mg | ORAL_TABLET | Freq: Once | ORAL | Status: AC
  Administered 2022-12-10: 5 mg via ORAL
  Filled 2022-12-10: qty 1

## 2022-12-10 MED ORDER — ONDANSETRON HCL 4 MG/2ML IJ SOLN
4.0000 mg | Freq: Three times a day (TID) | INTRAMUSCULAR | Status: DC | PRN
  Administered 2022-12-15: 4 mg via INTRAVENOUS
  Filled 2022-12-10: qty 2

## 2022-12-10 NOTE — Consult Note (Signed)
Neurosurgery-New Consultation Evaluation 12/10/2022 Zachary Fernandez [id number]  Identifying Statement: Zachary Fernandez is a 82 y.o. male from [place] [address] with fall   Physician Requesting Consultation: [facility] ED  History of Present Illness: Zachary Fernandez is here after a fall he says occurred on Tuesday. He does have a history of chronic back pain but states the pain has been worsening over the past few days. He denies any new leg symptoms. He has been ambulating. At baseline, he is fairly active. Given the pain, he came to the ED. CT there showed a fracture at T10 in setting of diffuse hyperostosis. He was placed in a TLSO brace  Past Medical History:  Past Medical History:  Diagnosis Date   EtOH dependence (HCC)    HLD (hyperlipidemia)    Hypertension     Social History: Social History   Socioeconomic History   Marital status: Widowed    Spouse name: Not on file   Number of children: Not on file   Years of education: Not on file   Highest education level: Not on file  Occupational History   Not on file  Tobacco Use   Smoking status: Never   Smokeless tobacco: Never  Substance and Sexual Activity   Alcohol use: Yes    Comment: daily   Drug use: Never   Sexual activity: Not on file  Other Topics Concern   Not on file  Social History Narrative   Not on file   Social Determinants of Health   Financial Resource Strain: Patient Declined (11/07/2022)   Received from [facility]   Overall Financial Resource Strain (CARDIA)    Difficulty of Paying Living Expenses: Patient declined  Food Insecurity: Patient Declined (11/07/2022)   Received from [facility]   Hunger Vital Sign    Worried About Running Out of Food in the Last Year: Patient declined    Ran Out of Food in the Last Year: Patient declined  Transportation Needs: Patient Declined (11/07/2022)   Received from [facility] -  Transportation    In the past 12 months, has lack of transportation kept you from medical appointments or from getting medications?: Patient declined    Lack of Transportation (Non-Medical): Patient declined  Physical Activity: Not on file  Stress: Not on file  Social Connections: Not on file  Intimate Partner Violence: Not At Risk (12/13/2021)   Humiliation, Afraid, Rape, and Kick questionnaire    Fear of Current or Ex-Partner: No    Emotionally Abused: No    Physically Abused: No    Sexually Abused: No    Family History: Family History  Problem Relation Age of Onset   Diabetes Brother     Review of Systems:  Review of Systems - General ROS: Negative Psychological ROS: Negative Ophthalmic ROS: Negative ENT ROS: Negative Hematological and Lymphatic ROS: Negative  Endocrine ROS: Negative Respiratory ROS: Negative Cardiovascular ROS: Negative Gastrointestinal ROS: Negative Genito-Urinary ROS: Negative Musculoskeletal ROS: Positive for back pain Neurological ROS: Negative for leg numbness or weakness Dermatological ROS: Negative  Physical Exam: BP (!) 99/57 (BP Location: Right Arm)   Pulse 66   Temp 97.8 F (36.6 C) (Oral)   Resp 15   Ht 5\' 4"  (1.626 m)   Wt 69 kg   SpO2 98%   BMI 26.11 kg/m  Body mass index is 26.11 kg/m. Body surface area is 1.77 meters squared. General appearance: Alert, cooperative, in no acute distress Head: Normocephalic,  atraumatic Eyes: Normal, EOM intact Oropharynx: Moist without lesions Back: in TLSO brace Ext: No edema in LE bilaterally  Neurologic exam:  Mental status: alertness: alert, affect: normal Speech: fluent and clear Motor:strength symmetric 5/5 in bilateral lower extremities in all motor groups Sensory: intact to light touch in bilateral lower extremities Gait: not tested, sitting upright  Laboratory: Results for orders placed or performed during the hospital encounter of 12/10/22  Comprehensive metabolic panel   Result Value Ref Range   Sodium 132 (L) 135 - 145 mmol/L   Potassium 4.1 3.5 - 5.1 mmol/L   Chloride 97 (L) 98 - 111 mmol/L   CO2 27 22 - 32 mmol/L   Glucose, Bld 101 (H) 70 - 99 mg/dL   BUN 19 8 - 23 mg/dL   Creatinine, Ser 1.61 0.61 - 1.24 mg/dL   Calcium 8.6 (L) 8.9 - 10.3 mg/dL   Total Protein 6.8 6.5 - 8.1 g/dL   Albumin 3.9 3.5 - 5.0 g/dL   AST 27 15 - 41 U/L   ALT 21 0 - 44 U/L   Alkaline Phosphatase 92 38 - 126 U/L   Total Bilirubin 1.1 <1.2 mg/dL   GFR, Estimated >09 >60 mL/min   Anion gap 8 5 - 15  CBC with Differential  Result Value Ref Range   WBC 6.9 4.0 - 10.5 K/uL   RBC 3.75 (L) 4.22 - 5.81 MIL/uL   Hemoglobin 12.6 (L) 13.0 - 17.0 g/dL   HCT 45.4 (L) 09.8 - 11.9 %   MCV 98.9 80.0 - 100.0 fL   MCH 33.6 26.0 - 34.0 pg   MCHC 34.0 30.0 - 36.0 g/dL   RDW 14.7 82.9 - 56.2 %   Platelets 123 (L) 150 - 400 K/uL   nRBC 0.0 0.0 - 0.2 %   Neutrophils Relative % 75 %   Neutro Abs 5.1 1.7 - 7.7 K/uL   Lymphocytes Relative 11 %   Lymphs Abs 0.8 0.7 - 4.0 K/uL   Monocytes Relative 11 %   Monocytes Absolute 0.7 0.1 - 1.0 K/uL   Eosinophils Relative 3 %   Eosinophils Absolute 0.2 0.0 - 0.5 K/uL   Basophils Relative 0 %   Basophils Absolute 0.0 0.0 - 0.1 K/uL   Immature Granulocytes 0 %   Abs Immature Granulocytes 0.02 0.00 - 0.07 K/uL   I personally reviewed labs  Imaging: CT Thoracic Spine: 1. Superior endplate fracture at T10 without significant compression. 2. Intramuscular hematoma in the left paraspinous musculature at the T10 level. 3. Moderate foraminal stenosis on the left at T9-10 is exacerbated by the fracture. 4. 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  MRI Thoracic Spine: . Acute fracture subjacent to the superior endplate of T10 with edema in the upper third of the T10 vertebral body. 2. Acute nondisplaced fracture along the inferior endplate of T9 with edema in the inferior endplate of T9. 3. Soft tissue swelling and  hemorrhage at the T9-10 level.   Impression/Plan:  Zachary Fernandez is here after a fall and a fracture at T10 that appears to go into the pedicle ad posteriorly. Given this in the setting of the ankylosis, I am concerned about stability and he needs to remain in brace. We discussed that this may not heal on its own and is a risk given his activity for worsening. If it worsened acutely, this could lead to risk of spinal cord damage. Therefore, we discussed utility of stabilization with surgery. We discussed that  this also has risks. Given all the factors, he would like to proceed with stabilization   1.  Diagnosis: T10 fracture  2.  Plan Admit for pre-operative evaluation - CIWA protocol per medical team - NPO at midnight - Hold on DVT prophylaxis at this time - Brace on at all times.

## 2022-12-10 NOTE — Discharge Instructions (Addendum)
It is very important that you wear your brace at all times. Please follow-up with the neurosurgeon for recheck as well.  Please call the office tomorrow to arrange this appointment. Please return to the emergency department for any new, worsening, or changing symptoms or other concerns including weakness in your legs, urinary or stool incontinence or retention, numbness or tingling in your extremities/buttocks/groin, fevers, or any other concerns or change in symptoms. It was a pleasure caring for you today.

## 2022-12-10 NOTE — ED Notes (Signed)
Called for TLSO brace 
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
## 2022-12-10 NOTE — ED Triage Notes (Addendum)
Pt comes with c/o fall on Tuesday. Pt states he hit his head and his mid center to lower back and side. Pt states his bones are bruised in his spine. Pt not on thinners and no loc.

## 2022-12-10 NOTE — H&P (Signed)
History and Physical    Tiara Topper WUJ:[id number] DOB: 1940/09/15 DOA: 12/10/2022  Referring MD/NP/PA:   PCP: Mick Sell, MD   Patient coming from:  The patient is coming from home.     Chief Complaint: fall and back pain  HPI: Zachary Fernandez is a 82 y.o. male with medical history significant of HTN, HLD, depression, BPH, chronic pain, alcohol abuse, who presents with fall and back pain.  Patient states that he accidentally fell on Tuesday. He fell backward and injured his head and back.  Denies LOC.  No unilateral numbness or tingling in extremities.  He developed pain in the mid back, which is constant, sharp, moderate to severe, nonradiating.  Denies leg numbness or weakness.  He does not have cough, SOB, fever or chills.  He reports right lower rib cage pain.  No nausea, vomiting, diarrhea or abdominal pain.  Denies symptoms of UTI.  Data reviewed independently and ED Course: pt was found to have WBC 6.9, GFR> 60, temperature normal, blood pressure 99/57, heart rate 71, RR 18, oxygen saturation 98% on room air.  CT of head and CT of C-spine is negative for acute injury.  Patient is admitted to MedSurg bed as inpatient. Dr. Adriana Simas of neurosurgery is consulted.    CT-T spin: 1. Superior endplate fracture at T10 without significant compression. 2. Intramuscular hematoma in the left paraspinous musculature at the T10 level. 3. Moderate foraminal stenosis on the left at T9-10 is exacerbated by the fracture. 4. 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 5. Right greater than left basilar atelectasis.  MRI-T spin: 1. Acute fracture subjacent to the superior endplate of T10 with edema in the upper third of the T10 vertebral body. 2. Acute nondisplaced fracture along the inferior endplate of T9 with edema in the inferior endplate of T9. 3. Soft tissue swelling and hemorrhage at the T9-10 level. 4. Mild foraminal narrowing bilaterally at T9-10. Stenosis  was exaggerated by CT. 5. Small right pleural effusion.  CT-chest: 1. Acute atypical fracture of the T10 vertebral body adjacent to the superior endplate. Small amount of prevertebral hematoma at this level. See dedicated thoracic spine CT for full detail. 2. Subacute healing nondisplaced fracture of the posterolateral right seventh and eighth ribs. 3. Trace right pleural effusion with mild overlying atelectasis, improving compared to the prior CT. 4. Dependent subsegmental atelectasis or consolidation within the left lung base. 5. 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   X-ray of T spin after  Subtle nondisplaced fracture at the superior endplate of the T10 vertebral body, better seen by same day CT. No change in alignmen   EKG: I have personally reviewed.  Sinus rhythm, QTc 448, low voltage, bifascicular block   Review of Systems:   General: no fevers, chills, no body weight gain,has fatigue HEENT: no blurry vision, hearing changes or sore throat Respiratory: no dyspnea, coughing, wheezing CV: no chest pain, no palpitations GI: no nausea, vomiting, abdominal pain, diarrhea, constipation GU: no dysuria, burning on urination, increased urinary frequency, hematuria  Ext: no leg edema Neuro: no unilateral weakness, numbness, or tingling, no vision change or hearing loss.  Has fall Skin: no rash, no skin tear. MSK: Has back pain, right lower rib cage pain Heme: No easy bruising.  Travel history: No recent long distant travel.   Allergy:  Allergies  Allergen Reactions   Other     Hazelnut, black walnuts, brazilian nuts     Past Medical History:  Diagnosis Date  EtOH dependence (HCC)    HLD (hyperlipidemia)    Hypertension     Past Surgical History:  Procedure Laterality Date   CHOLECYSTECTOMY     HIP CLOSED REDUCTION Right 12/12/2021   Procedure: CLOSED REDUCTION HIP;  Surgeon: Ross Marcus, MD;  Location: [facility] ORS;  Service: Orthopedics;   Laterality: Right;   KNEE ARTHROPLASTY Left    REVISION TOTAL HIP ARTHROPLASTY Bilateral    SHOULDER SURGERY Left     Social History:  reports that he has never smoked. He has never used smokeless tobacco. He reports current alcohol use. He reports that he does not use drugs.  Family History:  Family History  Problem Relation Age of Onset   Diabetes Brother      Prior to Admission medications   Medication Sig Start Date End Date Taking? Authorizing Provider  lidocaine (LIDODERM) 5 % Place 1 patch onto the skin every 12 (twelve) hours. Remove & Discard patch within 12 hours or as directed by MD 12/10/22 12/10/23 Yes Poggi, Eileen Stanford E, PA-C  amLODipine (NORVASC) 5 MG tablet Take 5 mg by mouth daily.    [provider]  Barberry-Oreg Grape-Goldenseal (BERBERINE COMPLEX PO) Take 1 Dose by mouth daily.    [provider]  bisoprolol-hydrochlorothiazide (ZIAC) 5-6.25 MG tablet Take 1 tablet by mouth daily.    [provider]  Calcium Citrate (CITRACAL PO) Take 650 mg by mouth. 2 tabs in am    [provider]  cyclobenzaprine (FLEXERIL) 5 MG tablet Take 5 mg by mouth 3 (three) times daily as needed for muscle spasms.    [provider]  desipramine (NORPRAMIN) 25 MG tablet Take 25 mg by mouth at bedtime.    [provider]  folic acid (FOLVITE) 1 MG tablet Take 1 tablet (1 mg total) by mouth daily. 12/15/21   Darlin Priestly, MD  Multiple Vitamins-Minerals (CENTRUM SILVER PO) Take 1 tablet by mouth daily.    [provider]  predniSONE (DELTASONE) 10 MG tablet Take 3 tablets once a day for 5 days 11/18/22   Bridget Hartshorn L, PA-C  rosuvastatin (CRESTOR) 20 MG tablet Take 20 mg by mouth daily.    [provider]  tamsulosin (FLOMAX) 0.4 MG CAPS capsule Take 0.4 mg by mouth.    [provider]  Testosterone 10 MG/ACT (2%) GEL Apply 4 Pump topically daily. 07/06/20   [provider]  thiamine (VITAMIN B-1) 100 MG tablet  Take 1 tablet (100 mg total) by mouth daily. 12/15/21   Darlin Priestly, MD  TURMERIC PO Take 1 tablet by mouth daily.    [provider]    Physical Exam: Vitals:   12/10/22 0757 12/10/22 0800 12/10/22 1058 12/10/22 1554  BP: (!) 118/57  120/60 (!) 99/57  Pulse: 71  68 66  Resp: 18  16 15   Temp: 97.6 F (36.4 C)  98 F (36.7 C) 97.8 F (36.6 C)  TempSrc:    Oral  SpO2: 98%  98% 98%  Weight:  69 kg    Height:  5\' 4"  (1.626 m)     General: Not in acute distress HEENT:       Eyes: PERRL, EOMI, no jaundice       ENT: No discharge from the ears and nose, no pharynx injection, no tonsillar enlargement.        Neck: No JVD, no bruit, no mass felt. Heme: No neck lymph node enlargement. Cardiac: S1/S2, RRR, No murmurs, No gallops or rubs. Respiratory: No  rales, wheezing, rhonchi or rubs. GI: Soft, nondistended, nontender, no rebound pain, no organomegaly, BS present. GU: No hematuria Ext: No pitting leg edema bilaterally. 1+DP/PT pulse bilaterally. Musculoskeletal: has mid back tenderness, on TLSO  Skin: No rashes.  Neuro: Alert, oriented X3, cranial nerves II-XII grossly intact, moves all extremities normally. Psych: Patient is not psychotic, no suicidal or hemocidal ideation.  Labs on Admission: I have personally reviewed following labs and imaging studies  CBC: Recent Labs  Lab 12/10/22 1812  WBC 6.9  NEUTROABS 5.1  HGB 12.6*  HCT 37.1*  MCV 98.9  PLT 123*   Basic Metabolic Panel: Recent Labs  Lab 12/10/22 1812  NA 132*  K 4.1  CL 97*  CO2 27  GLUCOSE 101*  BUN 19  CREATININE 0.81  CALCIUM 8.6*   GFR: Estimated Creatinine Clearance: 56.8 mL/min (by C-G formula based on SCr of 0.81 mg/dL). Liver Function Tests: Recent Labs  Lab 12/10/22 1812  AST 27  ALT 21  ALKPHOS 92  BILITOT 1.1  PROT 6.8  ALBUMIN 3.9   No results for input(s): "LIPASE", "AMYLASE" in the last 168 hours. No results for input(s): "AMMONIA" in the last 168 hours. Coagulation  Profile: No results for input(s): "INR", "PROTIME" in the last 168 hours. Cardiac Enzymes: Recent Labs  Lab 12/10/22 1812  CKTOTAL 34*   BNP (last 3 results) No results for input(s): "PROBNP" in the last 8760 hours. HbA1C: No results for input(s): "HGBA1C" in the last 72 hours. CBG: No results for input(s): "GLUCAP" in the last 168 hours. Lipid Profile: No results for input(s): "CHOL", "HDL", "LDLCALC", "TRIG", "CHOLHDL", "LDLDIRECT" in the last 72 hours. Thyroid Function Tests: No results for input(s): "TSH", "T4TOTAL", "FREET4", "T3FREE", "THYROIDAB" in the last 72 hours. Anemia Panel: No results for input(s): "VITAMINB12", "FOLATE", "FERRITIN", "TIBC", "IRON", "RETICCTPCT" in the last 72 hours. Urine analysis:    Component Value Date/Time   COLORURINE YELLOW (A) 10/21/2022 1159   APPEARANCEUR CLEAR (A) 10/21/2022 1159   LABSPEC 1.016 10/21/2022 1159   PHURINE 5.0 10/21/2022 1159   GLUCOSEU NEGATIVE 10/21/2022 1159   HGBUR NEGATIVE 10/21/2022 1159   BILIRUBINUR NEGATIVE 10/21/2022 1159   KETONESUR NEGATIVE 10/21/2022 1159   PROTEINUR NEGATIVE 10/21/2022 1159   NITRITE NEGATIVE 10/21/2022 1159   LEUKOCYTESUR NEGATIVE 10/21/2022 1159   Sepsis Labs: @LABRCNTIP (procalcitonin:4,lacticidven:4) )No results found for this or any previous visit (from the past 240 hour(s)).   Radiological Exams on Admission: MR THORACIC SPINE WO CONTRAST  Result Date: 12/10/2022 CLINICAL DATA:  Back trauma.  Fall.  Fracture identified on CT. EXAM: MRI THORACIC SPINE WITHOUT CONTRAST TECHNIQUE: Multiplanar, multisequence MR imaging of the thoracic spine was performed. No intravenous contrast was administered. COMPARISON:  CT of the thoracic spine 12/10/2022 FINDINGS: Alignment: No significant listhesis is present. Thoracic kyphosis is preserved. Vertebrae: The fracture subjacent to the superior endplate of T10 is again seen. Edema is present in the upper third of the T10 vertebral body. Focal edema  is also present along the inferior endplate of T9 with a nondisplaced fracture. No retropulsed bone is present. Marrow signal and vertebral body heights are otherwise normal. Cord:  Normal signal and morphology. Paraspinal and other soft tissues: Soft tissue swelling and hemorrhage is present at the T9-10 level. The paraspinous soft tissues are otherwise within normal limits. A small right pleural effusion is present. The visualized upper abdomen is unremarkable. Disc levels: Mild foraminal narrowing is present bilaterally at T9-10. Stenosis was exaggerated by CT. No other significant stenosis is  present in the thoracic spine. IMPRESSION: 1. Acute fracture subjacent to the superior endplate of T10 with edema in the upper third of the T10 vertebral body. 2. Acute nondisplaced fracture along the inferior endplate of T9 with edema in the inferior endplate of T9. 3. Soft tissue swelling and hemorrhage at the T9-10 level. 4. Mild foraminal narrowing bilaterally at T9-10. Stenosis was exaggerated by CT. 5. Small right pleural effusion. Electronically Signed   By: Marin Roberts M.D.   On: 12/10/2022 17:14   DG Thoracic Spine 2 View  Result Date: 12/10/2022 CLINICAL DATA:  Back pain, fall EXAM: THORACIC SPINE 2 VIEWS COMPARISON:  Same day thoracic spine CT FINDINGS: Diffuse ankylosis of the thoracic spine. Subtle nondisplaced fracture at the superior endplate of the T10 vertebral body, better seen by same day CT. No change in alignment. No traumatic listhesis. No new fractures. IMPRESSION: Subtle nondisplaced fracture at the superior endplate of the T10 vertebral body, better seen by same day CT. No change in alignment. Electronically Signed   By: Duanne Guess D.O.   On: 12/10/2022 14:58   CT CHEST WO CONTRAST  Result Date: 12/10/2022 CLINICAL DATA:  Chest trauma, blunt EXAM: CT CHEST WITHOUT CONTRAST TECHNIQUE: Multidetector CT imaging of the chest was performed following the standard protocol without IV  contrast. RADIATION DOSE REDUCTION: This exam was performed according to the departmental dose-optimization program which includes automated exposure control, adjustment of the mA and/or kV according to patient size and/or use of iterative reconstruction technique. COMPARISON:  10/27/2022 FINDINGS: Cardiovascular: Heart size within normal limits. No pericardial effusion. Thoracic aorta is nonaneurysmal. Scattered atherosclerotic vascular calcifications of the aorta and coronary arteries. Central pulmonary vasculature within normal limits. Mediastinum/Nodes: No enlarged mediastinal or axillary lymph nodes. Thyroid gland, trachea, and esophagus demonstrate no significant findings. Lungs/Pleura: Trace right pleural effusion with mild overlying atelectasis, improving compared to the prior CT. Dependent subsegmental atelectasis or consolidation within the left lung base. No left-sided pleural effusion. No pneumothorax. Upper Abdomen: No acute abnormality. Musculoskeletal: Subacute healing nondisplaced fracture of the posterolateral right seventh and eighth ribs. Additional chronic healed right-sided rib fractures. Diffuse ankylosis of the thoracic spine with atypical fracture of the T10 vertebral body adjacent to the superior endplate. Small amount of prevertebral hematoma at this level. Degenerative changes of both shoulders. IMPRESSION: 1. Acute atypical fracture of the T10 vertebral body adjacent to the superior endplate. Small amount of prevertebral hematoma at this level. See dedicated thoracic spine CT for full detail. 2. Subacute healing nondisplaced fracture of the posterolateral right seventh and eighth ribs. 3. Trace right pleural effusion with mild overlying atelectasis, improving compared to the prior CT. 4. Dependent subsegmental atelectasis or consolidation within the left lung base. 5. 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 Electronically Signed   By: Duanne Guess D.O.   On:  12/10/2022 10:10   CT T-SPINE NO CHARGE  Result Date: 12/10/2022 CLINICAL DATA:  Fall.  Bruising in the mid back.  Mid back pain. EXAM: CT THORACIC SPINE WITHOUT CONTRAST TECHNIQUE: Multidetector CT images of the thoracic were obtained using the standard protocol without intravenous contrast. RADIATION DOSE REDUCTION: This exam was performed according to the departmental dose-optimization program which includes automated exposure control, adjustment of the mA and/or kV according to patient size and/or use of iterative reconstruction technique. COMPARISON:  CT chest 10/27/2022 FINDINGS: Alignment: No significant listhesis is present. Thoracic kyphosis is stable. Vertebrae: A superior endplate fracture is present at T10 without significant compression. No retropulsed bone is  present. Subacute right-sided rib fractures are better visualized on the chest CT of the same day. Ankylosis is present through the remainder of the thoracic and lumbar spine. No other acute fractures are present. Paraspinal and other soft tissues: Intramuscular hematoma is present in the left paraspinous musculature at the T10 level. Stranding is present about the disc space at the level the fracture. The paraspinous soft tissues are otherwise within normal limits. Right greater than left basilar atelectasis is present. Disc levels: Moderate foraminal stenosis on the left at T9-10 is exacerbated by the fracture. IMPRESSION: 1. Superior endplate fracture at T10 without significant compression. 2. Intramuscular hematoma in the left paraspinous musculature at the T10 level. 3. Moderate foraminal stenosis on the left at T9-10 is exacerbated by the fracture. 4. 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 5. Right greater than left basilar atelectasis. These results were called by telephone at the time of interpretation on 12/10/2022 at 10:02 am to provider [facility] , who verbally acknowledged these results. Electronically  Signed   By: Marin Roberts M.D.   On: 12/10/2022 10:04   CT Cervical Spine Wo Contrast  Result Date: 12/10/2022 CLINICAL DATA:  Fall 5 days ago. Trauma to mid back. Pain in the back. EXAM: CT CERVICAL SPINE WITHOUT CONTRAST TECHNIQUE: Multidetector CT imaging of the cervical spine was performed without intravenous contrast. Multiplanar CT image reconstructions were also generated. RADIATION DOSE REDUCTION: This exam was performed according to the departmental dose-optimization program which includes automated exposure control, adjustment of the mA and/or kV according to patient size and/or use of iterative reconstruction technique. COMPARISON:  CT of the cervical spine 05/14/2020 FINDINGS: Alignment: Grade 1 anterolisthesis at C6-7 is stable. Straightening of the normal cervical lordosis is present. No other significant stenosis is present. Skull base and vertebrae: Craniocervical junction is normal. Vertebral body heights are normal. Soft tissues and spinal canal: No prevertebral fluid or swelling. No visible canal hematoma. Disc levels: Ankylosis is present from C2 through the lowest imaged level, T5-6. Facet hypertrophy contributes to left foraminal narrowing at C4-5 and right foraminal narrowing at C6-7. Upper chest: The lung apices are clear. Atherosclerotic calcifications are present at the aortic arch and great vessels. IMPRESSION: 1. No acute fracture or traumatic subluxation. 2. Ankylosis from C2 through the lowest imaged level, T5-6. 3. Facet hypertrophy contributes to left foraminal narrowing at C4-5 and right foraminal narrowing at C6-7. 4.  Aortic Atherosclerosis (ICD10-I70.0). Electronically Signed   By: Marin Roberts M.D.   On: 12/10/2022 09:51   CT HEAD WO CONTRAST ( )  Result Date: 12/10/2022 CLINICAL DATA:  Fall.  Patient hit head and mid back. EXAM: CT HEAD WITHOUT CONTRAST TECHNIQUE: Contiguous axial images were obtained from the base of the skull through the vertex  without intravenous contrast. RADIATION DOSE REDUCTION: This exam was performed according to the departmental dose-optimization program which includes automated exposure control, adjustment of the mA and/or kV according to patient size and/or use of iterative reconstruction technique. COMPARISON:  CT head without contrast/6/22 FINDINGS: Brain: Mild generalized atrophy and white matter disease is similar to the prior exam. No acute infarct, hemorrhage, or mass lesion is present. The ventricles are of normal size. No significant extraaxial fluid collection is present. The brainstem and cerebellum are within normal limits. Midline structures are within normal limits. Vascular: Atherosclerotic calcifications are present within the cavernous internal carotid arteries. No hyperdense vessel is present. Skull: Calvarium is intact. No focal lytic or blastic lesions are present. No  significant extracranial soft tissue lesion is present. Sinuses/Orbits: Minimal fluid is present in the right maxillary sinus. The paranasal sinuses and mastoid air cells are otherwise clear. Bilateral lens replacements are noted. Globes and orbits are otherwise unremarkable. IMPRESSION: 1. No acute intracranial abnormality or significant interval change. 2. Mild generalized atrophy and white matter disease likely reflects the sequela of chronic microvascular ischemia. 3. Minimal fluid in the right maxillary sinus. Electronically Signed   By: Marin Roberts M.D.   On: 12/10/2022 09:47      Assessment/Plan Principal Problem:   Thoracic vertebral fracture [facility]) Active Problems:   Fall at home, initial encounter   HTN (hypertension)   Right rib fracture_7th and 8th rib   HLD (hyperlipidemia)   BPH (benign prostatic hyperplasia)   Alcohol abuse   Assessment and Plan:  Thoracic vertebral fracture [facility]): As shown by MRI and CT scan. Dr. Adriana Simas of neurosurgery is consulted, planning to do surgery tomorrow.  -Admitted to MedSurg bed  as inpatient -TLSO -Pain control: As needed morphine, Percocet, Tylenol -Lidoderm patch -As needed Robaxin -Check INR/PTT/type screen -N.p.o. after midnight -consult TOC  Fall at home, initial encounter -Fall precaution -PT/OT when able to  HTN (hypertension): Blood pressure 99/57 -IV hydralazine as needed -Continue amlodipine -Hold Ziac due to soft blood pressure  Right rib fracture_7th and 8th rib -Incentive spirometry -Pain control as above  HLD (hyperlipidemia) -Crestor  BPH (benign prostatic hyperplasia) -Flomax  Alcohol abuse -Did counseling about importance of cutting down on his alcohol drinking and encouraged to stop drinking -CIWA protocol       DVT ppx: SCD  Code Status: Full code    Family Communication:  Yes, patient's son   at bed side  Disposition Plan:  Anticipate discharge back to previous environment  Consults called:  Dr. Adriana Simas of neurosurgery  Admission status and Level of care: Med-Surg:    as inpt      Dispo: The patient is from: Home              Anticipated d/c is to:  To be determined              Anticipated d/c date is: 2 days              Patient currently is not medically stable to d/c.    Severity of Illness:  The appropriate patient status for this patient is INPATIENT. Inpatient status is judged to be reasonable and necessary in order to provide the required intensity of service to ensure the patient's safety. The patient's presenting symptoms, physical exam findings, and initial radiographic and laboratory data in the context of their chronic comorbidities is felt to place them at high risk for further clinical deterioration. Furthermore, it is not anticipated that the patient will be medically stable for discharge from the hospital within 2 midnights of admission.   * I certify that at the point of admission it is my clinical judgment that the patient will require inpatient hospital care spanning beyond 2 midnights from the  point of admission due to high intensity of service, high risk for further deterioration and high frequency of surveillance required.*       Date of Service 12/11/2022    Lorretta Harp [facility]   If 7PM-7AM, please contact night-coverage [email or web address] 12/11/2022, 1:13 AM

## 2022-12-10 NOTE — ED Provider Notes (Cosign Needed Addendum)
  Physical Exam  BP (!) 99/57 (BP Location: Right Arm)   Pulse 66   Temp 97.8 F (36.6 C) (Oral)   Resp 15   Ht 5\' 4"  (1.626 m)   Wt 69 kg   SpO2 98%   BMI 26.11 kg/m   Physical Exam  Procedures  Procedures  ED Course / MDM   Clinical Course as of 12/10/22 1747  Sun Dec 10, 2022  5621 Patient sent back from CAT scan because he is unable to lay flat due to pain.  Will give pain medicine and try again [JP]  1015 Dr. Adriana Simas paged [JP]  1015 Per Dr. Adriana Simas: TLSO brace and upright xrays with the TLSO brace on [JP]  1503 Discussed with Dr. Adriana Simas who feels that patient is appropriate to go home but he must wear the brace at all times. [JP]  1509 Dr. Adriana Simas asked me to call him and reports change of plans: he thinks that patient would benefit from an MRI of the thoracic spine to see if this is an unstable fracture.  He has requested a call back.  If the fracture is unstable then he will need to be admitted with possible surgery tomorrow [JP]    Clinical Course User Index [JP] Poggi, Herb Grays, PA-C   Medical Decision Making Amount and/or Complexity of Data Reviewed Labs: ordered. Radiology: ordered.  Risk OTC drugs. Prescription drug management. Decision regarding hospitalization.   Care being transferred by Alvy Beal, PA-C, plan at this time is to await MRI thoracic spine.  If unstable will admit and have surgery tomorrow.  And supposed to speak with Dr. Adriana Simas when MRI results are in  Dr. Adriana Simas in to see the patient.  Did discuss admission with patient.  States he is gone inside him to let him know whether the patient agrees to stay.  States he can do that mission orders without having to contact hospitalist.  Patient is stable at this time.  In discussion with the patient's family the patient is an alcoholic.  Did let Dr. Adriana Simas know.  Feels it is important to involve the hospitalist.  Consult to hospitalist for admission  Spoke with Dr. Clyde Lundborg, will be admitting the patient.  Did add him  to secure chat with Dr. Lorayne Bender, Roselyn Bering, PA-C 12/10/22 1747    Faythe Ghee, PA-C 12/10/22 2010    Minna Antis, MD 12/12/22 1445

## 2022-12-10 NOTE — ED Notes (Signed)
See triage note   Presents s/p fall last Tuesday States he fell straight back  unsure what he hit  Having pain to mid back

## 2022-12-10 NOTE — Progress Notes (Signed)
Orthopedic Tech Progress Note Patient Details:  Zachary Fernandez [birth year] [id number]  Patient ID: [name], male   DOB: [birth year], 82 y.o.   MRN: [id number] TLSO ordered from [facility]. Darleen Crocker 12/10/2022, 12:16 PM

## 2022-12-11 DIAGNOSIS — F10921 Alcohol use, unspecified with intoxication delirium: Secondary | ICD-10-CM | POA: Diagnosis not present

## 2022-12-11 DIAGNOSIS — S22078A Other fracture of T9-T10 vertebra, initial encounter for closed fracture: Secondary | ICD-10-CM

## 2022-12-11 DIAGNOSIS — F10939 Alcohol use, unspecified with withdrawal, unspecified: Secondary | ICD-10-CM | POA: Diagnosis present

## 2022-12-11 DIAGNOSIS — W19XXXA Unspecified fall, initial encounter: Secondary | ICD-10-CM | POA: Diagnosis not present

## 2022-12-11 DIAGNOSIS — S2241XA Multiple fractures of ribs, right side, initial encounter for closed fracture: Secondary | ICD-10-CM | POA: Diagnosis not present

## 2022-12-11 LAB — URINALYSIS, COMPLETE (UACMP) WITH MICROSCOPIC
Bilirubin Urine: NEGATIVE
Glucose, UA: NEGATIVE mg/dL
Hgb urine dipstick: NEGATIVE
Ketones, ur: NEGATIVE mg/dL
Leukocytes,Ua: NEGATIVE
Nitrite: NEGATIVE
Protein, ur: NEGATIVE mg/dL
Specific Gravity, Urine: 1.012 (ref 1.005–1.030)
Squamous Epithelial / HPF: 0 /[HPF] (ref 0–5)
pH: 8 (ref 5.0–8.0)

## 2022-12-11 LAB — CBC
HCT: 38.6 % — ABNORMAL LOW (ref 39.0–52.0)
Hemoglobin: 13 g/dL (ref 13.0–17.0)
MCH: 32.8 pg (ref 26.0–34.0)
MCHC: 33.7 g/dL (ref 30.0–36.0)
MCV: 97.5 fL (ref 80.0–100.0)
Platelets: 124 10*3/uL — ABNORMAL LOW (ref 150–400)
RBC: 3.96 MIL/uL — ABNORMAL LOW (ref 4.22–5.81)
RDW: 13.1 % (ref 11.5–15.5)
WBC: 6.3 10*3/uL (ref 4.0–10.5)
nRBC: 0 % (ref 0.0–0.2)

## 2022-12-11 LAB — BASIC METABOLIC PANEL
Anion gap: 8 (ref 5–15)
BUN: 14 mg/dL (ref 8–23)
CO2: 30 mmol/L (ref 22–32)
Calcium: 9.1 mg/dL (ref 8.9–10.3)
Chloride: 99 mmol/L (ref 98–111)
Creatinine, Ser: 0.74 mg/dL (ref 0.61–1.24)
GFR, Estimated: >60 mL/min (ref >60)
Glucose, Bld: 120 mg/dL — ABNORMAL HIGH (ref 70–99)
Potassium: 4.2 mmol/L (ref 3.5–5.1)
Sodium: 137 mmol/L (ref 135–145)

## 2022-12-11 LAB — TYPE AND SCREEN
ABO/RH(D): O POS
Antibody Screen: NEGATIVE

## 2022-12-11 LAB — APTT: aPTT: 35 s (ref 24–36)

## 2022-12-11 LAB — PROTIME-INR
INR: 1.3 — ABNORMAL HIGH (ref 0.8–1.2)
Prothrombin Time: 16 s — ABNORMAL HIGH (ref 11.4–15.2)

## 2022-12-11 LAB — ETHANOL: Alcohol, Ethyl (B): <10 mg/dL (ref <10)

## 2022-12-11 MED ORDER — CHLORDIAZEPOXIDE HCL 25 MG PO CAPS
25.0000 mg | ORAL_CAPSULE | Freq: Four times a day (QID) | ORAL | Status: AC
  Filled 2022-12-11: qty 1

## 2022-12-11 MED ORDER — AMLODIPINE BESYLATE 5 MG PO TABS
5.0000 mg | ORAL_TABLET | Freq: Every day | ORAL | Status: DC
  Administered 2022-12-11: 5 mg via ORAL
  Filled 2022-12-11: qty 1

## 2022-12-11 MED ORDER — CHLORDIAZEPOXIDE HCL 25 MG PO CAPS
25.0000 mg | ORAL_CAPSULE | Freq: Every day | ORAL | Status: AC
  Administered 2022-12-14: 25 mg via ORAL
  Filled 2022-12-11: qty 1

## 2022-12-11 MED ORDER — OXYCODONE-ACETAMINOPHEN 5-325 MG PO TABS: 1.0000 | ORAL_TABLET | ORAL | Status: DC

## 2022-12-11 MED ORDER — DESIPRAMINE HCL 25 MG PO TABS
25.0000 mg | ORAL_TABLET | Freq: Every day | ORAL | Status: DC
  Administered 2022-12-11 – 2022-12-19 (×8): 25 mg via ORAL
  Filled 2022-12-11 (×9): qty 1

## 2022-12-11 MED ORDER — CHLORDIAZEPOXIDE HCL 25 MG PO CAPS
50.0000 mg | ORAL_CAPSULE | Freq: Once | ORAL | Status: AC
  Administered 2022-12-11: 50 mg via ORAL
  Filled 2022-12-11: qty 2

## 2022-12-11 MED ORDER — TAMSULOSIN HCL 0.4 MG PO CAPS
0.4000 mg | ORAL_CAPSULE | Freq: Every day | ORAL | Status: DC
  Administered 2022-12-13: 0.4 mg via ORAL
  Filled 2022-12-11 (×2): qty 1

## 2022-12-11 MED ORDER — CYANOCOBALAMIN 500 MCG PO TABS
500.0000 ug | ORAL_TABLET | Freq: Every day | ORAL | Status: DC
  Filled 2022-12-11: qty 1

## 2022-12-11 MED ORDER — OXYCODONE-ACETAMINOPHEN 5-325 MG PO TABS
1.0000 | ORAL_TABLET | ORAL | Status: DC
  Administered 2022-12-11: 1 via ORAL
  Filled 2022-12-11 (×2): qty 1

## 2022-12-11 MED ORDER — HYDROMORPHONE HCL 1 MG/ML IJ SOLN
0.5000 mg | INTRAMUSCULAR | Status: DC | PRN
  Administered 2022-12-12 – 2022-12-20 (×13): 0.5 mg via INTRAVENOUS
  Filled 2022-12-11 (×14): qty 0.5

## 2022-12-11 MED ORDER — CHLORDIAZEPOXIDE HCL 25 MG PO CAPS
25.0000 mg | ORAL_CAPSULE | ORAL | Status: AC
  Administered 2022-12-13 – 2022-12-14 (×2): 25 mg via ORAL
  Filled 2022-12-11 (×2): qty 1

## 2022-12-11 MED ORDER — CHLORDIAZEPOXIDE HCL 25 MG PO CAPS
25.0000 mg | ORAL_CAPSULE | Freq: Three times a day (TID) | ORAL | Status: AC
  Administered 2022-12-13 (×2): 25 mg via ORAL
  Filled 2022-12-11 (×2): qty 1

## 2022-12-11 MED ORDER — MORPHINE SULFATE (PF) 2 MG/ML IV SOLN
2.0000 mg | Freq: Three times a day (TID) | INTRAVENOUS | Status: DC | PRN
  Administered 2022-12-11 – 2022-12-18 (×8): 2 mg via INTRAVENOUS
  Filled 2022-12-11 (×9): qty 1

## 2022-12-11 MED ORDER — ROSUVASTATIN CALCIUM 20 MG PO TABS: 20.0000 mg | ORAL_TABLET | Freq: Every day | ORAL | Status: DC

## 2022-12-11 NOTE — ED Notes (Signed)
This RN performed CIWA scale on patient. Informed family that patient would need to receive 2mg  of ativan due to a high CIWA score. Wife insisted that we are not to give ativan and states that ativan caused patient to have seizures. This RN attempted to explain to family that patient has been receiving ativan here in the ED and it is given to prevent seizures. Messaged MD at this time.

## 2022-12-11 NOTE — ED Notes (Signed)
Messaged MD regarding Ot's recommendation for NPO status.

## 2022-12-11 NOTE — ED Notes (Signed)
Pt resting comfortably at this time.

## 2022-12-11 NOTE — ED Notes (Signed)
MD messaged this RN and to inform that she has talked with family and they are agreeable to pt receiving ativan

## 2022-12-11 NOTE — Progress Notes (Addendum)
PROGRESS NOTE  Zachary Fernandez    DOB: Sep 02, 1940, 82 y.o.  GNF:[id number]    Code Status: Full Code   DOA: 12/10/2022   LOS: 1   Brief hospital course  Zachary Fernandez is a 82 y.o. male with a PMH significant for HTN, HLD, depression, BPH, chronic pain, alcohol dependence.  They presented from home to the ED on 12/10/2022 with a fall and back pain. His spouse states that he drinks about half a bottle of hard liquor per day and he sneaks it at night so could be more. His last drink was the night prior to arrival.   In the ED, it was found that they had temperature normal, blood pressure 99/57, heart rate 71, RR 18, oxygen saturation 98% on room air.  Significant findings included WBC 6.9, GFR> 60. CT of head and CT of C-spine is negative for acute injury.  CT-T spin: 1. Superior endplate fracture at T10 without significant compression. 2. Intramuscular hematoma in the left paraspinous musculature at the T10 level. 3. Moderate foraminal stenosis on the left at T9-10 is exacerbated by the fracture. 4. 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 5. Right greater than left basilar atelectasis.  MRI-T spin: 1. Acute fracture subjacent to the superior endplate of T10 with edema in the upper third of the T10 vertebral body. 2. Acute nondisplaced fracture along the inferior endplate of T9 with edema in the inferior endplate of T9. 3. Soft tissue swelling and hemorrhage at the T9-10 level. 4. Mild foraminal narrowing bilaterally at T9-10. Stenosis was exaggerated by CT. 5. Small right pleural effusion.   CT-chest: 1. Acute atypical fracture of the T10 vertebral body adjacent to the superior endplate. Small amount of prevertebral hematoma at this level. See dedicated thoracic spine CT for full detail. 2. Subacute healing nondisplaced fracture of the posterolateral right seventh and eighth ribs. 3. Trace right pleural effusion with mild overlying atelectasis, improving  compared to the prior CT. 4. Dependent subsegmental atelectasis or consolidation within the left lung base. 5. 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  They were initially treated with analgesia.  Neurosurgery was consulted. TSLO brace placed  Patient was admitted to medicine service for further workup and management of vertebral fracture and alcohol withdrawal as outlined in detail below.  12/11/22 -severe agitation and tremors  Assessment & Plan  Principal Problem:   Thoracic vertebral fracture [facility]) Active Problems:   Fall at home, initial encounter   HTN (hypertension)   Right rib fracture_7th and 8th rib   HLD (hyperlipidemia)   BPH (benign prostatic hyperplasia)   Alcohol abuse  Thoracic vertebral fracture [facility]): As shown by MRI and CT scan. - neurosurgery consulted- Dr Marcell Barlow recommends non-surgical management -TLSO -Pain control: As needed morphine, Percocet, Tylenol -Lidoderm patch -As needed Robaxin  Acute alcohol withdrawal- per spouse- half a bottle of vodka or other hard liquor per day and likely more than she knows about. Contributed to fall. She does not know of history of seizures. He is acutely withdrawing and tremulous on exam. Agitated and disoriented. Last drink was early morning/night of 12/1. - start librium taper - continue ativan PRN - upgrade bed assignment to progressive.  - NPO - add on alcohol level - CIWA monitoring   Fall at home, initial encounter -Fall precaution -PT/OT when able to - analgesia PRN   HTN (hypertension):  -Continue amlodipine -Hold Ziac due to soft blood pressure   Right rib fracture_7th and 8th rib -Incentive spirometry -Pain  control as above   HLD (hyperlipidemia) -Crestor   BPH (benign prostatic hyperplasia) -Flomax  Body mass index is 26.11 kg/m.  VTE ppx: SCDs Start: 12/10/22 2024   Diet:     Diet   Diet NPO time specified Except for: Sips with Meds, Ice Chips    Consultants: Neurosurgery   Subjective 12/11/22    Pt reports nothing. He is delirious during encounter.    Objective   Vitals:   12/10/22 1058 12/10/22 1554 12/11/22 0239 12/11/22 0745  BP: 120/60 (!) 99/57 120/67 113/81  Pulse: 68 66 68 70  Resp: 16 15 18 17   Temp: 98 F (36.7 C) 97.8 F (36.6 C) 98.1 F (36.7 C) 98.3 F (36.8 C)  TempSrc:  Oral Oral Oral  SpO2: 98% 98% 92% 92%  Weight:      Height:       No intake or output data in the 24 hours ending 12/11/22 0751 Filed Weights   12/10/22 0800  Weight: 69 kg     Physical Exam:  General: awake, alert, in acute distress, agitated. Attempting to get out of bed.  HEENT: atraumatic, clear conjunctiva, anicteric sclera, MMM, hearing grossly normal Respiratory: normal respiratory effort. Cardiovascular: quick capillary refill, normal S1/S2, RRR, no JVD, murmurs Nervous: Alert. Moving all extremities. Clear speech, non-appropriate responses to questions Extremities: moves all equally, no edema, normal tone Skin: dry, intact, normal temperature, normal color. No rashes, lesions or ulcers on exposed skin Psychiatry: agitated.   Labs   I have personally reviewed the following labs and imaging studies CBC    Component Value Date/Time   WBC 6.9 12/10/2022 1812   RBC 3.75 (L) 12/10/2022 1812   HGB 12.6 (L) 12/10/2022 1812   HCT 37.1 (L) 12/10/2022 1812   PLT 123 (L) 12/10/2022 1812   MCV 98.9 12/10/2022 1812   MCH 33.6 12/10/2022 1812   MCHC 34.0 12/10/2022 1812   RDW 13.2 12/10/2022 1812   LYMPHSABS 0.8 12/10/2022 1812   MONOABS 0.7 12/10/2022 1812   EOSABS 0.2 12/10/2022 1812   BASOSABS 0.0 12/10/2022 1812      Latest Ref Rng & Units 12/11/2022    6:28 AM 12/10/2022    6:12 PM 11/18/2022    9:58 AM  BMP  Glucose 70 - 99 mg/dL 161  096  045   BUN 8 - 23 mg/dL 14  19  15    Creatinine 0.61 - 1.24 mg/dL 4.09  8.11  9.14   Sodium 135 - 145 mmol/L 137  132  133   Potassium 3.5 - 5.1 mmol/L 4.2  4.1  3.9    Chloride 98 - 111 mmol/L 99  97  96   CO2 22 - 32 mmol/L 30  27  26    Calcium 8.9 - 10.3 mg/dL 9.1  8.6  8.9     MR THORACIC SPINE WO CONTRAST  Result Date: 12/10/2022 CLINICAL DATA:  Back trauma.  Fall.  Fracture identified on CT. EXAM: MRI THORACIC SPINE WITHOUT CONTRAST TECHNIQUE: Multiplanar, multisequence MR imaging of the thoracic spine was performed. No intravenous contrast was administered. COMPARISON:  CT of the thoracic spine 12/10/2022 FINDINGS: Alignment: No significant listhesis is present. Thoracic kyphosis is preserved. Vertebrae: The fracture subjacent to the superior endplate of T10 is again seen. Edema is present in the upper third of the T10 vertebral body. Focal edema is also present along the inferior endplate of T9 with a nondisplaced fracture. No retropulsed bone is present. Marrow signal and vertebral body heights  are otherwise normal. Cord:  Normal signal and morphology. Paraspinal and other soft tissues: Soft tissue swelling and hemorrhage is present at the T9-10 level. The paraspinous soft tissues are otherwise within normal limits. A small right pleural effusion is present. The visualized upper abdomen is unremarkable. Disc levels: Mild foraminal narrowing is present bilaterally at T9-10. Stenosis was exaggerated by CT. No other significant stenosis is present in the thoracic spine. IMPRESSION: 1. Acute fracture subjacent to the superior endplate of T10 with edema in the upper third of the T10 vertebral body. 2. Acute nondisplaced fracture along the inferior endplate of T9 with edema in the inferior endplate of T9. 3. Soft tissue swelling and hemorrhage at the T9-10 level. 4. Mild foraminal narrowing bilaterally at T9-10. Stenosis was exaggerated by CT. 5. Small right pleural effusion. Electronically Signed   By: Marin Roberts M.D.   On: 12/10/2022 17:14   DG Thoracic Spine 2 View  Result Date: 12/10/2022 CLINICAL DATA:  Back pain, fall EXAM: THORACIC SPINE 2 VIEWS  COMPARISON:  Same day thoracic spine CT FINDINGS: Diffuse ankylosis of the thoracic spine. Subtle nondisplaced fracture at the superior endplate of the T10 vertebral body, better seen by same day CT. No change in alignment. No traumatic listhesis. No new fractures. IMPRESSION: Subtle nondisplaced fracture at the superior endplate of the T10 vertebral body, better seen by same day CT. No change in alignment. Electronically Signed   By: Duanne Guess D.O.   On: 12/10/2022 14:58   CT CHEST WO CONTRAST  Result Date: 12/10/2022 CLINICAL DATA:  Chest trauma, blunt EXAM: CT CHEST WITHOUT CONTRAST TECHNIQUE: Multidetector CT imaging of the chest was performed following the standard protocol without IV contrast. RADIATION DOSE REDUCTION: This exam was performed according to the departmental dose-optimization program which includes automated exposure control, adjustment of the mA and/or kV according to patient size and/or use of iterative reconstruction technique. COMPARISON:  10/27/2022 FINDINGS: Cardiovascular: Heart size within normal limits. No pericardial effusion. Thoracic aorta is nonaneurysmal. Scattered atherosclerotic vascular calcifications of the aorta and coronary arteries. Central pulmonary vasculature within normal limits. Mediastinum/Nodes: No enlarged mediastinal or axillary lymph nodes. Thyroid gland, trachea, and esophagus demonstrate no significant findings. Lungs/Pleura: Trace right pleural effusion with mild overlying atelectasis, improving compared to the prior CT. Dependent subsegmental atelectasis or consolidation within the left lung base. No left-sided pleural effusion. No pneumothorax. Upper Abdomen: No acute abnormality. Musculoskeletal: Subacute healing nondisplaced fracture of the posterolateral right seventh and eighth ribs. Additional chronic healed right-sided rib fractures. Diffuse ankylosis of the thoracic spine with atypical fracture of the T10 vertebral body adjacent to the  superior endplate. Small amount of prevertebral hematoma at this level. Degenerative changes of both shoulders. IMPRESSION: 1. Acute atypical fracture of the T10 vertebral body adjacent to the superior endplate. Small amount of prevertebral hematoma at this level. See dedicated thoracic spine CT for full detail. 2. Subacute healing nondisplaced fracture of the posterolateral right seventh and eighth ribs. 3. Trace right pleural effusion with mild overlying atelectasis, improving compared to the prior CT. 4. Dependent subsegmental atelectasis or consolidation within the left lung base. 5. 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 Electronically Signed   By: Duanne Guess D.O.   On: 12/10/2022 10:10   CT T-SPINE NO CHARGE  Result Date: 12/10/2022 CLINICAL DATA:  Fall.  Bruising in the mid back.  Mid back pain. EXAM: CT THORACIC SPINE WITHOUT CONTRAST TECHNIQUE: Multidetector CT images of the thoracic were obtained using the standard protocol without intravenous  contrast. RADIATION DOSE REDUCTION: This exam was performed according to the departmental dose-optimization program which includes automated exposure control, adjustment of the mA and/or kV according to patient size and/or use of iterative reconstruction technique. COMPARISON:  CT chest 10/27/2022 FINDINGS: Alignment: No significant listhesis is present. Thoracic kyphosis is stable. Vertebrae: A superior endplate fracture is present at T10 without significant compression. No retropulsed bone is present. Subacute right-sided rib fractures are better visualized on the chest CT of the same day. Ankylosis is present through the remainder of the thoracic and lumbar spine. No other acute fractures are present. Paraspinal and other soft tissues: Intramuscular hematoma is present in the left paraspinous musculature at the T10 level. Stranding is present about the disc space at the level the fracture. The paraspinous soft tissues are otherwise  within normal limits. Right greater than left basilar atelectasis is present. Disc levels: Moderate foraminal stenosis on the left at T9-10 is exacerbated by the fracture. IMPRESSION: 1. Superior endplate fracture at T10 without significant compression. 2. Intramuscular hematoma in the left paraspinous musculature at the T10 level. 3. Moderate foraminal stenosis on the left at T9-10 is exacerbated by the fracture. 4. 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 5. Right greater than left basilar atelectasis. These results were called by telephone at the time of interpretation on 12/10/2022 at 10:02 am to provider [facility] , who verbally acknowledged these results. Electronically Signed   By: Marin Roberts M.D.   On: 12/10/2022 10:04   CT Cervical Spine Wo Contrast  Result Date: 12/10/2022 CLINICAL DATA:  Fall 5 days ago. Trauma to mid back. Pain in the back. EXAM: CT CERVICAL SPINE WITHOUT CONTRAST TECHNIQUE: Multidetector CT imaging of the cervical spine was performed without intravenous contrast. Multiplanar CT image reconstructions were also generated. RADIATION DOSE REDUCTION: This exam was performed according to the departmental dose-optimization program which includes automated exposure control, adjustment of the mA and/or kV according to patient size and/or use of iterative reconstruction technique. COMPARISON:  CT of the cervical spine 05/14/2020 FINDINGS: Alignment: Grade 1 anterolisthesis at C6-7 is stable. Straightening of the normal cervical lordosis is present. No other significant stenosis is present. Skull base and vertebrae: Craniocervical junction is normal. Vertebral body heights are normal. Soft tissues and spinal canal: No prevertebral fluid or swelling. No visible canal hematoma. Disc levels: Ankylosis is present from C2 through the lowest imaged level, T5-6. Facet hypertrophy contributes to left foraminal narrowing at C4-5 and right foraminal narrowing at C6-7.  Upper chest: The lung apices are clear. Atherosclerotic calcifications are present at the aortic arch and great vessels. IMPRESSION: 1. No acute fracture or traumatic subluxation. 2. Ankylosis from C2 through the lowest imaged level, T5-6. 3. Facet hypertrophy contributes to left foraminal narrowing at C4-5 and right foraminal narrowing at C6-7. 4.  Aortic Atherosclerosis (ICD10-I70.0). Electronically Signed   By: Marin Roberts M.D.   On: 12/10/2022 09:51   CT HEAD WO CONTRAST ( )  Result Date: 12/10/2022 CLINICAL DATA:  Fall.  Patient hit head and mid back. EXAM: CT HEAD WITHOUT CONTRAST TECHNIQUE: Contiguous axial images were obtained from the base of the skull through the vertex without intravenous contrast. RADIATION DOSE REDUCTION: This exam was performed according to the departmental dose-optimization program which includes automated exposure control, adjustment of the mA and/or kV according to patient size and/or use of iterative reconstruction technique. COMPARISON:  CT head without contrast/6/22 FINDINGS: Brain: Mild generalized atrophy and white matter disease is similar to the  prior exam. No acute infarct, hemorrhage, or mass lesion is present. The ventricles are of normal size. No significant extraaxial fluid collection is present. The brainstem and cerebellum are within normal limits. Midline structures are within normal limits. Vascular: Atherosclerotic calcifications are present within the cavernous internal carotid arteries. No hyperdense vessel is present. Skull: Calvarium is intact. No focal lytic or blastic lesions are present. No significant extracranial soft tissue lesion is present. Sinuses/Orbits: Minimal fluid is present in the right maxillary sinus. The paranasal sinuses and mastoid air cells are otherwise clear. Bilateral lens replacements are noted. Globes and orbits are otherwise unremarkable. IMPRESSION: 1. No acute intracranial abnormality or significant interval change. 2.  Mild generalized atrophy and white matter disease likely reflects the sequela of chronic microvascular ischemia. 3. Minimal fluid in the right maxillary sinus. Electronically Signed   By: Marin Roberts M.D.   On: 12/10/2022 09:47    Disposition Plan & Communication  Patient status: Inpatient  Admitted From: Home Planned disposition location: Home Anticipated discharge date: 12/7 pending clinical stabilization   Family Communication: spouse at bedside    Author: Leeroy Bock, DO [facility] 12/11/2022, 7:51 AM   Available by Epic secure chat 7AM-7PM. If 7PM-7AM, please contact night-coverage.  [facility] contact information found on [email or web address].

## 2022-12-11 NOTE — ED Notes (Signed)
OT at bedside. 

## 2022-12-11 NOTE — Evaluation (Signed)
Clinical/Bedside Swallow Evaluation Patient Details  Name: Zachary Fernandez MRN: [id number] Date of Birth: May 12, 1940  Today's Date: 12/11/2022 Time: SLP Start Time (ACUTE ONLY): 1530 SLP Stop Time (ACUTE ONLY): 1620 SLP Time Calculation (min) (ACUTE ONLY): 50 min  Past Medical History:  Past Medical History:  Diagnosis Date   EtOH dependence (HCC)    HLD (hyperlipidemia)    Hypertension    Past Surgical History:  Past Surgical History:  Procedure Laterality Date   CHOLECYSTECTOMY     HIP CLOSED REDUCTION Right 12/12/2021   Procedure: CLOSED REDUCTION HIP;  Surgeon: Ross Marcus, MD;  Location: [facility] ORS;  Service: Orthopedics;  Laterality: Right;   KNEE ARTHROPLASTY Left    REVISION TOTAL HIP ARTHROPLASTY Bilateral    SHOULDER SURGERY Left    HPI:  Pt is a 82 y.o. male with a PMH significant for HTN, HLD, depression, BPH, chronic pain, Alcohol Dependence, and recent Fall.  He presented from home to the ED on 12/10/2022 with a fall and back pain. His spouse states that he drinks about half a bottle of hard liquor per day and he sneaks it at night so could be more. His last drink was the night prior to arrival.   CT of Spine - T:  1. Superior endplate fracture at T10 without significant  compression.  2. Intramuscular hematoma in the left paraspinous musculature at the  T10 level.  3. 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   CT of Chest: Trace right pleural effusion with mild overlying atelectasis,  improving compared to the prior CT.  4. Dependent subsegmental atelectasis or consolidation within the  left lung base.  5. Aortic and coronary artery atherosclerosis    Assessment / Plan / Recommendation  Clinical Impression   Pt seen for BSE today. Pt lethargic, drowsy. Opened eyes intermittently to mod-max verbal/tactile stim. Wife present and supported. Pt w/ back brace on d/t fx per chart. NSG reported pt had received sedating medication in last  24 hours.  On RA, afebrile, WBC wnl.  Pt appears to present w/ oropharyngeal phase dysphagia in setting of poor State -- lethargy s/p sedating medication given d/t agitation. Pt has Baseline of ETOH dependence. ANY sedated State can impact overall awareness/timing of swallow and safety of swallowing during po tasks which increases risk for aspiration/aspiration pneumonia. Pt's declined State w/ poor alertness/awareness at this hour poses too High of a risk for aspiration to recommend oral intake.   Pt consumed only few trials of ice chip, purees, and thin liquids(via pinched straw) w/ no overt clinical s/s of aspiration noted: no decline in vocal quality, no cough, and no decline in respiratory status during/post trials. O2 sats remained in upper 90s. Pt was only given a minimal amount d/t poor alertness. However, pt's oral phase was impacted by the State decline c/b oral holding and prolonged bolus management and oral clearing; decreased awareness for oral prep phase. Pt required full feeding support and guidance d/t Cognitive decline. OM Exam was cursory but appeared [facility] w/ No unilateral weakness noted. Confusion of OM tasks and oral care present.     D/t pt's current State presentation, recommend NPO status w/ frequent oral care for hygiene and stimulation of swallowing; general aspiration precautions. Recommend alternative means for medications; Fluids. MD/NSG updated. ST services will f/u tomorrow w/ ongoing assessment. Wife given education and update on above, agreed. SLP Visit Diagnosis: Dysphagia, oropharyngeal phase (R13.12) (in setting of lethargy; ETOH dependence/withdrawal)  Aspiration Risk  Mild aspiration risk;Risk for inadequate nutrition/hydration    Diet Recommendation   NPO; frequent oral care  Medication Administration: Via alternative means    Other  Recommendations Recommended Consults:  (Dietician f/u) Oral Care Recommendations: Oral care QID;Staff/trained caregiver to  provide oral care (for hygiene and stimulation of swallowing) Caregiver Recommendations:  (TBD)    Recommendations for follow up therapy are one component of a multi-disciplinary discharge planning process, led by the attending physician.  Recommendations may be updated based on patient status, additional functional criteria and insurance authorization.  Follow up Recommendations  (TBD)      Assistance Recommended at Discharge    Functional Status Assessment Patient has had a recent decline in their functional status and demonstrates the ability to make significant improvements in function in a reasonable and predictable amount of time.  Frequency and Duration min 2x/week  2 weeks       Prognosis Prognosis for improved oropharyngeal function: Fair (-Good) Barriers to Reach Goals: Time post onset;Severity of deficits;Medication;Behavior Barriers/Prognosis Comment: any sedating medications      Swallow Study   General Date of Onset: 12/10/22 HPI: Pt is a 82 y.o. male with a PMH significant for HTN, HLD, depression, BPH, chronic pain, Alcohol Dependence, and recent Fall.  He presented from home to the ED on 12/10/2022 with a fall and back pain. His spouse states that he drinks about half a bottle of hard liquor per day and he sneaks it at night so could be more. His last drink was the night prior to arrival.   CT of Spine - T:  1. Superior endplate fracture at T10 without significant  compression.  2. Intramuscular hematoma in the left paraspinous musculature at the  T10 level.  3. 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   CT of Chest: Trace right pleural effusion with mild overlying atelectasis,  improving compared to the prior CT.  4. Dependent subsegmental atelectasis or consolidation within the  left lung base.  5. Aortic and coronary artery atherosclerosis Type of Study: Bedside Swallow Evaluation Previous Swallow Assessment: none Diet Prior to  this Study: NPO (except meds per MD order) Temperature Spikes Noted: No (wbc 6.3) Respiratory Status: Room air History of Recent Intubation: No Behavior/Cognition: Confused;Pleasant mood;Lethargic/Drowsy;Requires cueing;Doesn't follow directions (opened eyes intermittently) Oral Cavity Assessment: Dry Oral Care Completed by SLP: Yes Oral Cavity - Dentition: Adequate natural dentition Vision:  (n/a) Self-Feeding Abilities: Total assist Patient Positioning: Upright in bed (supported positioning d/t back brace) Baseline Vocal Quality: Low vocal intensity (muttered speech) Volitional Cough: Cognitively unable to elicit Volitional Swallow: Unable to elicit    Oral/Motor/Sensory Function Overall Oral Motor/Sensory Function:  (No unilateral weakness noted w/ few po trials at lips/on tongue)   Ice Chips Ice chips: Impaired Presentation: Spoon (fed; 2 trials) Oral Phase Impairments: Poor awareness of bolus Oral Phase Functional Implications: Prolonged oral transit;Oral holding Pharyngeal Phase Impairments:  (none)   Thin Liquid Thin Liquid: Impaired Presentation: Straw (pinched; 3 trials) Oral Phase Impairments: Poor awareness of bolus Oral Phase Functional Implications: Prolonged oral transit;Oral holding Pharyngeal  Phase Impairments:  (none) Other Comments: belched x1 then coughed    Nectar Thick Nectar Thick Liquid: Not tested   Honey Thick Honey Thick Liquid: Not tested   Puree Puree: Impaired Presentation: Spoon (fed; 2 trials) Oral Phase Impairments: Poor awareness of bolus Oral Phase Functional Implications: Prolonged oral transit Pharyngeal Phase Impairments:  (none)   Solid  Solid: Not tested         Jerilynn Som, MS, CCC-SLP Speech Language Pathologist Rehab Services; [facility] [phone] (ascom) Alex Mcmanigal 12/11/2022,4:44 PM

## 2022-12-12 ENCOUNTER — Telehealth: Payer: Self-pay

## 2022-12-12 DIAGNOSIS — F10921 Alcohol use, unspecified with intoxication delirium: Secondary | ICD-10-CM | POA: Diagnosis not present

## 2022-12-12 LAB — CBC
HCT: 44.9 % (ref 39.0–52.0)
Hemoglobin: 15.3 g/dL (ref 13.0–17.0)
MCH: 32.3 pg (ref 26.0–34.0)
MCHC: 34.1 g/dL (ref 30.0–36.0)
MCV: 94.9 fL (ref 80.0–100.0)
Platelets: 139 10*3/uL — ABNORMAL LOW (ref 150–400)
RBC: 4.73 MIL/uL (ref 4.22–5.81)
RDW: 13 % (ref 11.5–15.5)
WBC: 7.2 10*3/uL (ref 4.0–10.5)
nRBC: 0 % (ref 0.0–0.2)

## 2022-12-12 LAB — BASIC METABOLIC PANEL
Anion gap: 11 (ref 5–15)
BUN: 9 mg/dL (ref 8–23)
CO2: 24 mmol/L (ref 22–32)
Calcium: 9.2 mg/dL (ref 8.9–10.3)
Chloride: 101 mmol/L (ref 98–111)
Creatinine, Ser: 0.62 mg/dL (ref 0.61–1.24)
GFR, Estimated: >60 mL/min (ref >60)
Glucose, Bld: 102 mg/dL — ABNORMAL HIGH (ref 70–99)
Potassium: 4.4 mmol/L (ref 3.5–5.1)
Sodium: 136 mmol/L (ref 135–145)

## 2022-12-12 MED ORDER — DEXTROSE-SODIUM CHLORIDE 5-0.9 % IV SOLN: INTRAVENOUS | Status: AC

## 2022-12-12 MED ORDER — ENOXAPARIN SODIUM 40 MG/0.4ML IJ SOSY
40.0000 mg | PREFILLED_SYRINGE | INTRAMUSCULAR | Status: DC
  Administered 2022-12-12 – 2022-12-19 (×8): 40 mg via SUBCUTANEOUS
  Filled 2022-12-12 (×8): qty 0.4

## 2022-12-12 NOTE — Telephone Encounter (Signed)
Patient is currently admitted

## 2022-12-12 NOTE — Plan of Care (Signed)
  Problem: Clinical Measurements: Goal: Will remain free from infection Outcome: Progressing Goal: Diagnostic test results will improve Outcome: Progressing   Problem: Elimination: Goal: Will not experience complications related to bowel motility Outcome: Progressing

## 2022-12-12 NOTE — Progress Notes (Signed)
SLP Cancellation Note  Patient Details Name: Zachary Fernandez MRN: [id number] DOB: 04-Feb-1940   Cancelled treatment:       Reason Eval/Treat Not Completed: Medical issues which prohibited therapy;Patient's level of consciousness;Patient not medically ready;Fatigue/lethargy limiting ability to participate (chart reviewed; consulted NSG and met w/ pt/Family in room. Pt was sleeping soundly.)  Attempted tx session w/ pt. However, NSG reported pt had c/o pain and pain medication recently given. Pt is currently sleeping soundly; lethargic. Pt is Not safe for oral intake currently d/t decreased LOC.  Discussed at length w/ Wife and Dtr present in room pt's increased risk for aspiration w/ any oral intake if lethargic; also discussed the impact of his other risk factors including declined Cognitive status (Dtr reported "he has Dementia"), Pain and discomfort from back fx, and Baseline ETOH use/dependence and withdrawal issues.  Discussion w/ both Family and NSG/MD that pt should remain strictly NPO in current declined State presentation d/t risk for aspiration/aspiration pneumonia w/ any oral intake. ST services will continue to follow daily w/ po trials to establish least restrictive diet consistency. Family agreed. MD agreed and is starting IVFs for support.     Jerilynn Som, MS, CCC-SLP Speech Language Pathologist Rehab Services; [facility] [phone] (ascom) Zachary Fernandez 12/12/2022, 5:08 PM

## 2022-12-12 NOTE — Progress Notes (Signed)
Received pt from ED lethargic, responds to voice and pain. Pt is on TLSO brace. Noted tremors on both hands and paroxysmal sweats. Changed pt's linens. During repositioning, pt kept asking where he's at and was also calling out for his wife, Zachary Fernandez. PRN Ativan 2mg  IV given for CIWA score of 13. Admission questions answered by pt's wife, Zachary Fernandez.

## 2022-12-12 NOTE — TOC Progression Note (Signed)
Transition of Care [facility]) - Progression Note    Patient Details  Name: Zachary Fernandez MRN: [id number] Date of Birth: 01/14/1940  Transition of Care [facility] CM/SW Contact  Truddie Hidden, RN Phone Number: 12/12/2022, 1:25 PM  Clinical Narrative:    TOC continuing to follow patient's progress throughout discharge planning.        Expected Discharge Plan and Services                                               Social Determinants of Health (SDOH) Interventions SDOH Screenings   Food Insecurity: No Food Insecurity (12/12/2022)  Housing: Low Risk  (12/12/2022)  Transportation Needs: No Transportation Needs (12/12/2022)  Utilities: Not At Risk (12/12/2022)  Depression (PHQ2-9): Low Risk  (08/31/2022)  Financial Resource Strain: Patient Declined (11/07/2022)   Received from [facility]  Tobacco Use: Low Risk  (12/10/2022)  Recent Concern: Tobacco Use - Medium Risk (11/07/2022)   Received from [facility]    Readmission Risk Interventions     No data to display

## 2022-12-12 NOTE — Telephone Encounter (Signed)
Dr Adriana Simas saw this patient as a consult on 12/10/22 for a T10 fracture while covering call for our department.   Per discussion with Manning Charity, PA-C, he will need a new patient appointment with one of our PA's in 2-3 weeks with xrays that day.

## 2022-12-12 NOTE — Hospital Course (Addendum)
HPI: Zachary Fernandez is a 82 y.o. male with a PMH significant for HTN, HLD, depression, BPH, chronic pain, alcohol dependence. He presented from home to the ED on 12/10/2022 with a fall and back pain. His spouse states that he drinks about half a bottle of hard liquor per day and he sneaks it at night so could be more. His last drink was the night prior to arrival.   Hospital course / significant events:  12/01: admitted to hospitalist service w/ thoracic vertebral fracture and CIWA protocol  12/02: active withdrawals. Started librium taper  12/03: still withdrawals but stable. Confirmed w/ neurosurgery recs against surgical intervention unless worsening   Consultants:  neurosurgery  Procedures/Surgeries: none      ASSESSMENT & PLAN:  Acute alcohol withdrawal Last drink was early morning/night of 12/1. continue librium taper (Started 12/02) continue ativan PRN NPO pending SLP eval, D5NS IV fluids  CIWA monitoring  Thoracic vertebral fracture [facility]): As shown by MRI and CT scan. neurosurgery - Dr Marcell Barlow and Dr Katrinka Blazing recommending for non-surgical management, confirmed today w/ Dr Katrinka Blazing TLSO Pain control: As needed morphine, Percocet, Tylenol, Lidoderm patch, As needed Robaxin   Fall at home, initial encounter Fall precaution PT/OT when able to analgesia PRN   HTN (hypertension):  Continue amlodipine Hold Ziac    Right rib fracture_7th and 8th rib Incentive spirometry Pain control as above   HLD (hyperlipidemia) Crestor   BPH (benign prostatic hyperplasia) Flomax     overweight based on BMI: Body mass index is 26.11 kg/m.  Underweight - under 18.5  normal weight - 18.5 to 24.9 overweight - 25 to 29.9 obese - 30 or more   DVT prophylaxis: lovenox  IV fluids: D5NS continuous IV fluids while NPO Nutrition: NPO pending SLP eval  Central lines / invasive devices: none  Code Status: FULL CODE ACP documentation reviewed:  none on file in [facility]  TOC needs:  TBD, expect may need rehab, PT/OT to evaluate when cognitively improved  Barriers to dispo / significant pending items: withdrawal EtOH

## 2022-12-12 NOTE — Progress Notes (Signed)
PROGRESS NOTE    Zachary Fernandez   WUJ:[id number] DOB: 05-19-1940  DOA: 12/10/2022 Date of Service: 12/12/22 which is hospital day 2  PCP: Mick Sell, MD    HPI: Zachary Fernandez is a 82 y.o. male with a PMH significant for HTN, HLD, depression, BPH, chronic pain, alcohol dependence. He presented from home to the ED on 12/10/2022 with a fall and back pain. His spouse states that he drinks about half a bottle of hard liquor per day and he sneaks it at night so could be more. His last drink was the night prior to arrival.   Hospital course / significant events:  12/01: admitted to hospitalist service w/ thoracic vertebral fracture and CIWA protocol  12/02: active withdrawals. Started librium taper  12/03: still withdrawals but stable. Confirmed w/ neurosurgery recs against surgical intervention unless worsening   Consultants:  neurosurgery  Procedures/Surgeries: none      ASSESSMENT & PLAN:  Acute alcohol withdrawal Last drink was early morning/night of 12/1. continue librium taper (Started 12/02) continue ativan PRN NPO pending SLP eval, D5NS IV fluids  CIWA monitoring  Thoracic vertebral fracture [facility]): As shown by MRI and CT scan. neurosurgery - Dr Marcell Barlow and Dr Katrinka Blazing recommending for non-surgical management, confirmed today w/ Dr Katrinka Blazing TLSO Pain control: As needed morphine, Percocet, Tylenol, Lidoderm patch, As needed Robaxin   Fall at home, initial encounter Fall precaution PT/OT when able to analgesia PRN   HTN (hypertension):  Continue amlodipine Hold Ziac    Right rib fracture_7th and 8th rib Incentive spirometry Pain control as above   HLD (hyperlipidemia) Crestor   BPH (benign prostatic hyperplasia) Flomax     overweight based on BMI: Body mass index is 26.11 kg/m.  Underweight - under 18.5  normal weight - 18.5 to 24.9 overweight - 25 to 29.9 obese - 30 or more   DVT prophylaxis: lovenox  IV fluids: D5NS continuous IV fluids  while NPO Nutrition: NPO pending SLP eval  Central lines / invasive devices: none  Code Status: FULL CODE ACP documentation reviewed:  none on file in [facility]  TOC needs: TBD, expect may need rehab, PT/OT to evaluate when cognitively improved  Barriers to dispo / significant pending items: withdrawal EtOH             Subjective / Brief ROS:  Patient unable to contribute   Family Communication: wife and daughter at bedside on rounds     Objective Findings:  Vitals:   12/12/22 0641 12/12/22 0910 12/12/22 1031 12/12/22 1240  BP: 134/83 (!) 149/84 (!) 143/88 (!) 147/82  Pulse: 93 83 83 85  Resp: 20 16  16   Temp:    97.6 F (36.4 C)  TempSrc:      SpO2: 98% 100%  97%  Weight:      Height:        Intake/Output Summary (Last 24 hours) at 12/12/2022 1413 Last data filed at 12/12/2022 1047 Gross per 24 hour  Intake --  Output 1950 ml  Net -1950 ml   Filed Weights   12/10/22 0800  Weight: 69 kg    Examination:  Physical Exam Constitutional:      General: He is not in acute distress. Cardiovascular:     Rate and Rhythm: Normal rate and regular rhythm.  Pulmonary:     Effort: Pulmonary effort is normal.     Breath sounds: Normal breath sounds.  Abdominal:     General: Abdomen is flat.     Palpations: Abdomen  is soft.  Musculoskeletal:     Right lower leg: No edema.     Left lower leg: No edema.  Neurological:     Mental Status: He is disoriented.          Scheduled Medications:   chlordiazePOXIDE  25 mg Oral QID   Followed by   chlordiazePOXIDE  25 mg Oral TID   Followed by   Melene Muller ON 12/13/2022] chlordiazePOXIDE  25 mg Oral BH-qamhs   Followed by   Melene Muller ON 12/14/2022] chlordiazePOXIDE  25 mg Oral Daily   desipramine  25 mg Oral QHS   enoxaparin (LOVENOX) injection  40 mg Subcutaneous Q24H   lidocaine  1 patch Transdermal Q24H   tamsulosin  0.4 mg Oral Daily   thiamine  100 mg Oral Daily   Or   thiamine  100 mg Intravenous Daily     Continuous Infusions:  dextrose 5 % and 0.9 % NaCl      PRN Medications:  acetaminophen, HYDROmorphone (DILAUDID) injection, LORazepam **OR** LORazepam, morphine injection, ondansetron (ZOFRAN) IV  Antimicrobials from admission:  Anti-infectives (From admission, onward)    None           Data Reviewed:  I have personally reviewed the following...  CBC: Recent Labs  Lab 12/10/22 1812 12/11/22 0628 12/12/22 0559  WBC 6.9 6.3 7.2  NEUTROABS 5.1  --   --   HGB 12.6* 13.0 15.3  HCT 37.1* 38.6* 44.9  MCV 98.9 97.5 94.9  PLT 123* 124* 139*   Basic Metabolic Panel: Recent Labs  Lab 12/10/22 1812 12/11/22 0628 12/12/22 0559  NA 132* 137 136  K 4.1 4.2 4.4  CL 97* 99 101  CO2 27 30 24   GLUCOSE 101* 120* 102*  BUN 19 14 9   CREATININE 0.81 0.74 0.62  CALCIUM 8.6* 9.1 9.2   GFR: Estimated Creatinine Clearance: 57.6 mL/min (by C-G formula based on SCr of 0.62 mg/dL). Liver Function Tests: Recent Labs  Lab 12/10/22 1812  AST 27  ALT 21  ALKPHOS 92  BILITOT 1.1  PROT 6.8  ALBUMIN 3.9   No results for input(s): "LIPASE", "AMYLASE" in the last 168 hours. No results for input(s): "AMMONIA" in the last 168 hours. Coagulation Profile: Recent Labs  Lab 12/11/22 0628  INR 1.3*   Cardiac Enzymes: Recent Labs  Lab 12/10/22 1812  CKTOTAL 34*   BNP (last 3 results) No results for input(s): "PROBNP" in the last 8760 hours. HbA1C: No results for input(s): "HGBA1C" in the last 72 hours. CBG: No results for input(s): "GLUCAP" in the last 168 hours. Lipid Profile: No results for input(s): "CHOL", "HDL", "LDLCALC", "TRIG", "CHOLHDL", "LDLDIRECT" in the last 72 hours. Thyroid Function Tests: No results for input(s): "TSH", "T4TOTAL", "FREET4", "T3FREE", "THYROIDAB" in the last 72 hours. Anemia Panel: No results for input(s): "VITAMINB12", "FOLATE", "FERRITIN", "TIBC", "IRON", "RETICCTPCT" in the last 72 hours. Most Recent Urinalysis On File:      Component Value Date/Time   COLORURINE YELLOW (A) 12/11/2022 1617   APPEARANCEUR HAZY (A) 12/11/2022 1617   LABSPEC 1.012 12/11/2022 1617   PHURINE 8.0 12/11/2022 1617   GLUCOSEU NEGATIVE 12/11/2022 1617   HGBUR NEGATIVE 12/11/2022 1617   BILIRUBINUR NEGATIVE 12/11/2022 1617   KETONESUR NEGATIVE 12/11/2022 1617   PROTEINUR NEGATIVE 12/11/2022 1617   NITRITE NEGATIVE 12/11/2022 1617   LEUKOCYTESUR NEGATIVE 12/11/2022 1617   Sepsis Labs: @LABRCNTIP (procalcitonin:4,lacticidven:4) Microbiology: No results found for this or any previous visit (from the past 240 hour(s)).    Radiology Studies  last 3 days: MR THORACIC SPINE WO CONTRAST  Result Date: 12/10/2022 CLINICAL DATA:  Back trauma.  Fall.  Fracture identified on CT. EXAM: MRI THORACIC SPINE WITHOUT CONTRAST TECHNIQUE: Multiplanar, multisequence MR imaging of the thoracic spine was performed. No intravenous contrast was administered. COMPARISON:  CT of the thoracic spine 12/10/2022 FINDINGS: Alignment: No significant listhesis is present. Thoracic kyphosis is preserved. Vertebrae: The fracture subjacent to the superior endplate of T10 is again seen. Edema is present in the upper third of the T10 vertebral body. Focal edema is also present along the inferior endplate of T9 with a nondisplaced fracture. No retropulsed bone is present. Marrow signal and vertebral body heights are otherwise normal. Cord:  Normal signal and morphology. Paraspinal and other soft tissues: Soft tissue swelling and hemorrhage is present at the T9-10 level. The paraspinous soft tissues are otherwise within normal limits. A small right pleural effusion is present. The visualized upper abdomen is unremarkable. Disc levels: Mild foraminal narrowing is present bilaterally at T9-10. Stenosis was exaggerated by CT. No other significant stenosis is present in the thoracic spine. IMPRESSION: 1. Acute fracture subjacent to the superior endplate of T10 with edema in the upper  third of the T10 vertebral body. 2. Acute nondisplaced fracture along the inferior endplate of T9 with edema in the inferior endplate of T9. 3. Soft tissue swelling and hemorrhage at the T9-10 level. 4. Mild foraminal narrowing bilaterally at T9-10. Stenosis was exaggerated by CT. 5. Small right pleural effusion. Electronically Signed   By: Marin Roberts M.D.   On: 12/10/2022 17:14   DG Thoracic Spine 2 View  Result Date: 12/10/2022 CLINICAL DATA:  Back pain, fall EXAM: THORACIC SPINE 2 VIEWS COMPARISON:  Same day thoracic spine CT FINDINGS: Diffuse ankylosis of the thoracic spine. Subtle nondisplaced fracture at the superior endplate of the T10 vertebral body, better seen by same day CT. No change in alignment. No traumatic listhesis. No new fractures. IMPRESSION: Subtle nondisplaced fracture at the superior endplate of the T10 vertebral body, better seen by same day CT. No change in alignment. Electronically Signed   By: Duanne Guess D.O.   On: 12/10/2022 14:58   CT CHEST WO CONTRAST  Result Date: 12/10/2022 CLINICAL DATA:  Chest trauma, blunt EXAM: CT CHEST WITHOUT CONTRAST TECHNIQUE: Multidetector CT imaging of the chest was performed following the standard protocol without IV contrast. RADIATION DOSE REDUCTION: This exam was performed according to the departmental dose-optimization program which includes automated exposure control, adjustment of the mA and/or kV according to patient size and/or use of iterative reconstruction technique. COMPARISON:  10/27/2022 FINDINGS: Cardiovascular: Heart size within normal limits. No pericardial effusion. Thoracic aorta is nonaneurysmal. Scattered atherosclerotic vascular calcifications of the aorta and coronary arteries. Central pulmonary vasculature within normal limits. Mediastinum/Nodes: No enlarged mediastinal or axillary lymph nodes. Thyroid gland, trachea, and esophagus demonstrate no significant findings. Lungs/Pleura: Trace right pleural effusion  with mild overlying atelectasis, improving compared to the prior CT. Dependent subsegmental atelectasis or consolidation within the left lung base. No left-sided pleural effusion. No pneumothorax. Upper Abdomen: No acute abnormality. Musculoskeletal: Subacute healing nondisplaced fracture of the posterolateral right seventh and eighth ribs. Additional chronic healed right-sided rib fractures. Diffuse ankylosis of the thoracic spine with atypical fracture of the T10 vertebral body adjacent to the superior endplate. Small amount of prevertebral hematoma at this level. Degenerative changes of both shoulders. IMPRESSION: 1. Acute atypical fracture of the T10 vertebral body adjacent to the superior endplate. Small amount of prevertebral hematoma  at this level. See dedicated thoracic spine CT for full detail. 2. Subacute healing nondisplaced fracture of the posterolateral right seventh and eighth ribs. 3. Trace right pleural effusion with mild overlying atelectasis, improving compared to the prior CT. 4. Dependent subsegmental atelectasis or consolidation within the left lung base. 5. 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 Electronically Signed   By: Duanne Guess D.O.   On: 12/10/2022 10:10   CT T-SPINE NO CHARGE  Result Date: 12/10/2022 CLINICAL DATA:  Fall.  Bruising in the mid back.  Mid back pain. EXAM: CT THORACIC SPINE WITHOUT CONTRAST TECHNIQUE: Multidetector CT images of the thoracic were obtained using the standard protocol without intravenous contrast. RADIATION DOSE REDUCTION: This exam was performed according to the departmental dose-optimization program which includes automated exposure control, adjustment of the mA and/or kV according to patient size and/or use of iterative reconstruction technique. COMPARISON:  CT chest 10/27/2022 FINDINGS: Alignment: No significant listhesis is present. Thoracic kyphosis is stable. Vertebrae: A superior endplate fracture is present at T10 without  significant compression. No retropulsed bone is present. Subacute right-sided rib fractures are better visualized on the chest CT of the same day. Ankylosis is present through the remainder of the thoracic and lumbar spine. No other acute fractures are present. Paraspinal and other soft tissues: Intramuscular hematoma is present in the left paraspinous musculature at the T10 level. Stranding is present about the disc space at the level the fracture. The paraspinous soft tissues are otherwise within normal limits. Right greater than left basilar atelectasis is present. Disc levels: Moderate foraminal stenosis on the left at T9-10 is exacerbated by the fracture. IMPRESSION: 1. Superior endplate fracture at T10 without significant compression. 2. Intramuscular hematoma in the left paraspinous musculature at the T10 level. 3. Moderate foraminal stenosis on the left at T9-10 is exacerbated by the fracture. 4. 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 5. Right greater than left basilar atelectasis. These results were called by telephone at the time of interpretation on 12/10/2022 at 10:02 am to provider [facility] , who verbally acknowledged these results. Electronically Signed   By: Marin Roberts M.D.   On: 12/10/2022 10:04   CT Cervical Spine Wo Contrast  Result Date: 12/10/2022 CLINICAL DATA:  Fall 5 days ago. Trauma to mid back. Pain in the back. EXAM: CT CERVICAL SPINE WITHOUT CONTRAST TECHNIQUE: Multidetector CT imaging of the cervical spine was performed without intravenous contrast. Multiplanar CT image reconstructions were also generated. RADIATION DOSE REDUCTION: This exam was performed according to the departmental dose-optimization program which includes automated exposure control, adjustment of the mA and/or kV according to patient size and/or use of iterative reconstruction technique. COMPARISON:  CT of the cervical spine 05/14/2020 FINDINGS: Alignment: Grade 1  anterolisthesis at C6-7 is stable. Straightening of the normal cervical lordosis is present. No other significant stenosis is present. Skull base and vertebrae: Craniocervical junction is normal. Vertebral body heights are normal. Soft tissues and spinal canal: No prevertebral fluid or swelling. No visible canal hematoma. Disc levels: Ankylosis is present from C2 through the lowest imaged level, T5-6. Facet hypertrophy contributes to left foraminal narrowing at C4-5 and right foraminal narrowing at C6-7. Upper chest: The lung apices are clear. Atherosclerotic calcifications are present at the aortic arch and great vessels. IMPRESSION: 1. No acute fracture or traumatic subluxation. 2. Ankylosis from C2 through the lowest imaged level, T5-6. 3. Facet hypertrophy contributes to left foraminal narrowing at C4-5 and right foraminal narrowing at C6-7. 4.  Aortic Atherosclerosis (ICD10-I70.0). Electronically Signed   By: Marin Roberts M.D.   On: 12/10/2022 09:51   CT HEAD WO CONTRAST ( )  Result Date: 12/10/2022 CLINICAL DATA:  Fall.  Patient hit head and mid back. EXAM: CT HEAD WITHOUT CONTRAST TECHNIQUE: Contiguous axial images were obtained from the base of the skull through the vertex without intravenous contrast. RADIATION DOSE REDUCTION: This exam was performed according to the departmental dose-optimization program which includes automated exposure control, adjustment of the mA and/or kV according to patient size and/or use of iterative reconstruction technique. COMPARISON:  CT head without contrast/6/22 FINDINGS: Brain: Mild generalized atrophy and white matter disease is similar to the prior exam. No acute infarct, hemorrhage, or mass lesion is present. The ventricles are of normal size. No significant extraaxial fluid collection is present. The brainstem and cerebellum are within normal limits. Midline structures are within normal limits. Vascular: Atherosclerotic calcifications are present within  the cavernous internal carotid arteries. No hyperdense vessel is present. Skull: Calvarium is intact. No focal lytic or blastic lesions are present. No significant extracranial soft tissue lesion is present. Sinuses/Orbits: Minimal fluid is present in the right maxillary sinus. The paranasal sinuses and mastoid air cells are otherwise clear. Bilateral lens replacements are noted. Globes and orbits are otherwise unremarkable. IMPRESSION: 1. No acute intracranial abnormality or significant interval change. 2. Mild generalized atrophy and white matter disease likely reflects the sequela of chronic microvascular ischemia. 3. Minimal fluid in the right maxillary sinus. Electronically Signed   By: Marin Roberts M.D.   On: 12/10/2022 09:47       Sunnie Nielsen, DO [facility] 12/12/2022, 2:13 PM    Dictation software may have been used to generate the above note. Typos may occur and escape review in typed/dictated notes. Please contact Dr Lyn Hollingshead directly for clarity if needed.  Staff may message me via secure chat in Epic  but this may not receive an immediate response,  please page me for urgent matters!  If 7PM-7AM, please contact night coverage [email or web address]

## 2022-12-13 DIAGNOSIS — S22079A Unspecified fracture of T9-T10 vertebra, initial encounter for closed fracture: Secondary | ICD-10-CM | POA: Diagnosis not present

## 2022-12-13 LAB — BASIC METABOLIC PANEL
Anion gap: 11 (ref 5–15)
BUN: 9 mg/dL (ref 8–23)
CO2: 22 mmol/L (ref 22–32)
Calcium: 8.4 mg/dL — ABNORMAL LOW (ref 8.9–10.3)
Chloride: 101 mmol/L (ref 98–111)
Creatinine, Ser: 0.55 mg/dL — ABNORMAL LOW (ref 0.61–1.24)
GFR, Estimated: >60 mL/min (ref >60)
Glucose, Bld: 147 mg/dL — ABNORMAL HIGH (ref 70–99)
Potassium: 3.4 mmol/L — ABNORMAL LOW (ref 3.5–5.1)
Sodium: 134 mmol/L — ABNORMAL LOW (ref 135–145)

## 2022-12-13 LAB — VITAMIN D 25 HYDROXY (VIT D DEFICIENCY, FRACTURES): Vit D, 25-Hydroxy: 34.15 ng/mL (ref 30–100)

## 2022-12-13 LAB — FOLATE: Folate: 32 ng/mL (ref >5.9)

## 2022-12-13 LAB — PHOSPHORUS: Phosphorus: 2.9 mg/dL (ref 2.5–4.6)

## 2022-12-13 LAB — MAGNESIUM: Magnesium: 1.9 mg/dL (ref 1.7–2.4)

## 2022-12-13 MED ORDER — BISACODYL 5 MG PO TBEC
10.0000 mg | DELAYED_RELEASE_TABLET | Freq: Once | ORAL | Status: AC
  Administered 2022-12-13: 10 mg via ORAL
  Filled 2022-12-13: qty 2

## 2022-12-13 MED ORDER — BISACODYL 5 MG PO TBEC
10.0000 mg | DELAYED_RELEASE_TABLET | Freq: Every day | ORAL | Status: DC
  Administered 2022-12-13 – 2022-12-18 (×5): 10 mg via ORAL
  Filled 2022-12-13 (×6): qty 2

## 2022-12-13 MED ORDER — BISACODYL 10 MG RE SUPP: 10.0000 mg | Freq: Every day | RECTAL | Status: DC | PRN

## 2022-12-13 MED ORDER — POLYETHYLENE GLYCOL 3350 17 G PO PACK
17.0000 g | PACK | Freq: Two times a day (BID) | ORAL | Status: DC
  Administered 2022-12-13 – 2022-12-19 (×8): 17 g via ORAL
  Filled 2022-12-13 (×10): qty 1

## 2022-12-13 MED ORDER — TRAZODONE HCL 50 MG PO TABS
50.0000 mg | ORAL_TABLET | Freq: Every day | ORAL | Status: DC
  Administered 2022-12-13 – 2022-12-19 (×7): 50 mg via ORAL
  Filled 2022-12-13 (×7): qty 1

## 2022-12-13 MED ORDER — POTASSIUM CHLORIDE 20 MEQ PO PACK
40.0000 meq | PACK | Freq: Once | ORAL | Status: AC
  Administered 2022-12-13: 40 meq via ORAL
  Filled 2022-12-13: qty 2

## 2022-12-13 NOTE — Telephone Encounter (Signed)
Per chat from Dr Myer Haff on 12/13/22: "Patty - can you please make follow up appointments for this patient sometime late next week, four weeks after that, and six weeks after the second appointment? With one of the PAs. Will need thoracic spine x-rays at each appointment."

## 2022-12-13 NOTE — Progress Notes (Signed)
[facility] Progress Note  Patient: Zachary Fernandez    ZOX:[id number]  DOA: 12/10/2022     Date of Service: the patient was seen and examined on 12/13/2022  Chief Complaint  Patient presents with   Fall   Brief hospital course: Yusha Wiess is a 82 y.o. male with a PMH significant for HTN, HLD, depression, BPH, chronic pain, alcohol dependence. He presented from home to the ED on 12/10/2022 with a fall and back pain. His spouse states that he drinks about half a bottle of hard liquor per day and he sneaks it at night so could be more. His last drink was the night prior to arrival.    Hospital course / significant events:  12/01: admitted to hospitalist service w/ thoracic vertebral fracture and CIWA protocol  12/02: active withdrawals. Started librium taper  12/03: still withdrawals but stable. Confirmed w/ neurosurgery recs against surgical intervention unless worsening    Consultants:  Neurosurgery  Assessment and Plan: Acute alcohol withdrawal Last drink was early morning/night of 12/1. continue librium taper (Started 12/02) continue ativan PRN SLP eval done started dysphagia 2 diet, discontinued IV fluids CIWA monitoring   Thoracic vertebral fracture [facility]): As shown by MRI and CT scan. neurosurgery - Dr Marcell Barlow and Dr Katrinka Blazing recommending for non-surgical management, confirmed today w/ Dr Katrinka Blazing TLSO Pain control: As needed morphine, Percocet, Tylenol, Lidoderm patch, As needed Robaxin   Fall at home, initial encounter Fall precaution PT/OT when able to analgesia PRN   HTN (hypertension):  Continue amlodipine Hold Ziac    Right rib fracture_7th and 8th rib Incentive spirometry Pain control as above   HLD (hyperlipidemia) Crestor   BPH (benign prostatic hyperplasia) Flomax   Constipation Started laxatives    Body mass index is 26.11 kg/m.  Interventions:  Diet: Dysphagia 2 diet DVT Prophylaxis: Subcutaneous Lovenox   Advance goals of care  discussion: Full code  Family Communication: family was present at bedside, at the time of interview.  The pt provided permission to discuss medical plan with the family. Opportunity was given to ask question and all questions were answered satisfactorily.   Disposition:  Pt is from Home, admitted with Fall and fracture of T9/10 spine, EtOH withdrawal, still on Librium tapering dose, which precludes a safe discharge. Discharge to home versus SNF, TBD after PT and OT eval, when stable.  Subjective: No significant events overnight, patient was sleepy in the morning, he woke up after calling his name, still dozing off.  Stated that he is having backache 6/10, patient has significant hearing deficit, wears hearing aids, patient is mumbling while talking, hard to understand.    Physical Exam: General: NAD, lying comfortably Appear in no distress, affect appropriate Eyes: PERRLA ENT: Oral Mucosa Clear, moist  Neck: no JVD,  Cardiovascular: S1 and S2 Present, no Murmur,  Respiratory: good respiratory effort, Bilateral Air entry equal and Decreased, no Crackles, no wheezes Abdomen: Bowel Sound present, Soft and no tenderness,  Skin: no rashes Extremities: no Pedal edema, no calf tenderness Neurologic: without any new focal findings Gait not checked due to patient safety concerns  Vitals:   12/13/22 0424 12/13/22 0805 12/13/22 1149 12/13/22 1609  BP: (!) 147/92 (!) 144/83 (!) 152/91 135/87  Pulse: (!) 105 98 (!) 107 (!) 105  Resp: 20 19 18 16   Temp: 98 F (36.7 C) 98 F (36.7 C) 97.8 F (36.6 C) 97.8 F (36.6 C)  TempSrc:  Oral Oral Oral  SpO2: 100% 98% 99% 100%  Weight:  Height:        Intake/Output Summary (Last 24 hours) at 12/13/2022 1739 Last data filed at 12/13/2022 1617 Gross per 24 hour  Intake 1109.86 ml  Output 800 ml  Net 309.86 ml   Filed Weights   12/10/22 0800  Weight: 69 kg    Data Reviewed: I have personally reviewed and interpreted daily labs, tele  strips, imagings as discussed above. I reviewed all nursing notes, pharmacy notes, vitals, pertinent old records I have discussed plan of care as described above with RN and patient/family.  CBC: Recent Labs  Lab 12/10/22 1812 12/11/22 0628 12/12/22 0559  WBC 6.9 6.3 7.2  NEUTROABS 5.1  --   --   HGB 12.6* 13.0 15.3  HCT 37.1* 38.6* 44.9  MCV 98.9 97.5 94.9  PLT 123* 124* 139*   Basic Metabolic Panel: Recent Labs  Lab 12/10/22 1812 12/11/22 0628 12/12/22 0559 12/13/22 0444  NA 132* 137 136 134*  K 4.1 4.2 4.4 3.4*  CL 97* 99 101 101  CO2 27 30 24 22   GLUCOSE 101* 120* 102* 147*  BUN 19 14 9 9   CREATININE 0.81 0.74 0.62 0.55*  CALCIUM 8.6* 9.1 9.2 8.4*  MG  --   --   --  1.9  PHOS  --   --   --  2.9    Studies: No results found.  Scheduled Meds:  bisacodyl  10 mg Oral QHS   chlordiazePOXIDE  25 mg Oral TID   Followed by   chlordiazePOXIDE  25 mg Oral BH-qamhs   Followed by   Melene Muller ON 12/14/2022] chlordiazePOXIDE  25 mg Oral Daily   desipramine  25 mg Oral QHS   enoxaparin (LOVENOX) injection  40 mg Subcutaneous Q24H   lidocaine  1 patch Transdermal Q24H   polyethylene glycol  17 g Oral BID   tamsulosin  0.4 mg Oral Daily   thiamine  100 mg Oral Daily   Or   thiamine  100 mg Intravenous Daily   traZODone  50 mg Oral QHS   Continuous Infusions: PRN Meds: acetaminophen, bisacodyl, HYDROmorphone (DILAUDID) injection, LORazepam **OR** LORazepam, morphine injection, ondansetron (ZOFRAN) IV  Time spent: 35 minutes  Author: Gillis Santa. MD [facility] 12/13/2022 5:39 PM  To reach On-call, see care teams to locate the attending and reach out to them via [email or web address]. If 7PM-7AM, please contact night-coverage If you still have difficulty reaching the attending provider, please page the [facility] (Director on Call) for [facility] on amion for assistance.

## 2022-12-13 NOTE — Plan of Care (Signed)
  Problem: Clinical Measurements: Goal: Ability to maintain clinical measurements within normal limits will improve Outcome: Progressing Goal: Respiratory complications will improve Outcome: Progressing Goal: Cardiovascular complication will be avoided Outcome: Progressing   Problem: Nutrition: Goal: Adequate nutrition will be maintained Outcome: Not Progressing

## 2022-12-13 NOTE — Progress Notes (Signed)
Dr. Myer Haff at bedside and discussing plan of care with patient's wife. MD gave order regarding TSLO brace. MD gave order for patient to wear brace when ambulating and when OOB. May wear in bed if agitated or restless and at risk for injuring self.

## 2022-12-13 NOTE — Evaluation (Signed)
Physical Therapy Evaluation Patient Details Name: Zachary Fernandez MRN: [id number] DOB: 1940/11/14 Today's Date: 12/13/2022  History of Present Illness  82 y.o. male with a PMH significant for HTN, HLD, depression, BPH, chronic pain, alcohol dependence. He presented from home to the ED on 12/10/2022 with a fall and back pain, found to have T10 fracture. His spouse states that he drinks about half a bottle of hard liquor per day and he sneaks it at night so could be more.  Clinical Impression  Pt still somewhat lethargic and minimally interactive, but was able to follow basic cuing with occasional redirection.  He reports minimal pain at rest but did show signs of discomfort during mobility acts. Highly focused on hearing aide on arrival, did find it underneath him when we sat up. Pt needed assist with all mobility, donning TLSO, and generally needed almost constant cuing simply for safety awareness and to arrest some impulsivity.  Pt was able to stand at EOB and do a few side steps and marching attempts, but was inconsistent with ability to shift weight forward/onto the walker and needed almost constant direct assist to keep from falling back.  Communicated with nursing that his weakness and cognition precludes safe transition to recliner at this time; returned to bed post session.  Pt will benefit from continued PT to address functional limitations and work toward safe discharge.        If plan is discharge home, recommend the following: Two people to help with walking and/or transfers;A lot of help with bathing/dressing/bathroom;Assistance with cooking/housework;Assistance with feeding;Assist for transportation;Supervision due to cognitive status   Can travel by private vehicle   No    Equipment Recommendations  (TBD at next venue)  Recommendations for Other Services       Functional Status Assessment Patient has had a recent decline in their functional status and demonstrates the ability to  make significant improvements in function in a reasonable and predictable amount of time.     Precautions / Restrictions Precautions Required Braces or Orthoses: Spinal Brace Spinal Brace: Thoracolumbosacral orthotic (when OOB) Restrictions Weight Bearing Restrictions: No      Mobility  Bed Mobility Overal bed mobility: Needs Assistance Bed Mobility: Supine to Sit, Sit to Supine     Supine to sit: Mod assist Sit to supine: Max assist   General bed mobility comments: Pt showed decent effort with getting LEs toward EOB and trying to roll, but ultimately needed heavy assist with transition to side and then sitting EOB    Transfers Overall transfer level: Needs assistance Equipment used: Rolling walker (2 wheels) Transfers: Sit to/from Stand Sit to Stand: From elevated surface, Mod assist           General transfer comment: Again pt made good effort with getting up to standing but even with heavy cuing for UE use and a raised bed surface needed significant assist to initiate upward momentum and then attain standing, leaning back of legs on EOB to leverage up with phyiscal assist to insure he did not lean/sit right back down    Ambulation/Gait               General Gait Details: low grade buckling and retro lean preculded safe attempt at ambulation away from bed, he did manage a few effortful and assisted side steps along EOB, attempts at marching in place were very limited as he could not clear feet but did attempt weight shifts.  Stairs  Wheelchair Mobility     Tilt Bed    Modified Rankin (Stroke Patients Only)       Balance Overall balance assessment: Needs assistance Sitting-balance support: Bilateral upper extremity supported Sitting balance-Leahy Scale: Fair       Standing balance-Leahy Scale: Poor Standing balance comment: When heavily cued he was able to get weight over the walker temporarily, but consistently leaning backward and  unable to weight shift appropriately.                             Pertinent Vitals/Pain Pain Assessment Pain Assessment: 0-10 Pain Score: 0-No pain Pain Location: reports no pain at rest, does indicate mild back pain with movement    Home Living Family/patient expects to be discharged to:: Skilled nursing facility Living Arrangements: Spouse/significant other Available Help at Discharge: Family;Available 24 hours/day   Home Access: Elevator;Level entry       Home Layout: One level Home Equipment: Agricultural consultant (2 wheels);BSC/3in1;Cane - single point      Prior Function Prior Level of Function : Independent/Modified Independent             Mobility Comments: apparently he was driving himself to the gym 3x/week and was able to be quite active ADLs Comments: family reports he could do for himself     Extremity/Trunk Assessment                Communication      Cognition   Behavior During Therapy: Restless, Lability Overall Cognitive Status: Impaired/Different from baseline                                 General Comments: Family reports he remains far from his baseline as he is withdrawling        General Comments General comments (skin integrity, edema, etc.): Pt was variously lethargic and agitated but with gentle direction was willing to work with PT.  He was very weak and limited with mobility, unsafe to do much away from the EOB this date.    Exercises     Assessment/Plan    PT Assessment Patient needs continued PT services  PT Problem List Decreased strength;Decreased range of motion;Decreased activity tolerance;Decreased balance;Decreased mobility;Decreased cognition;Decreased knowledge of use of DME;Cardiopulmonary status limiting activity;Decreased safety awareness;Pain       PT Treatment Interventions DME instruction;Gait training;Functional mobility training;Therapeutic activities;Therapeutic exercise;Balance  training;Patient/family education    PT Goals (Current goals can be found in the Care Plan section)  Acute Rehab PT Goals Patient Stated Goal: family wants to see him up and walking PT Goal Formulation: With family Time For Goal Achievement: 12/26/22 Potential to Achieve Goals: Fair    Frequency Min 1X/week     Co-evaluation               AM-PAC PT "6 Clicks" Mobility  Outcome Measure Help needed turning from your back to your side while in a flat bed without using bedrails?: A Lot Help needed moving from lying on your back to sitting on the side of a flat bed without using bedrails?: A Lot Help needed moving to and from a bed to a chair (including a wheelchair)?: A Lot Help needed standing up from a chair using your arms (e.g., wheelchair or bedside chair)?: A Lot Help needed to walk in hospital room?: Total Help needed climbing 3-5 steps with a railing? :  Total 6 Click Score: 10    End of Session Equipment Utilized During Treatment: Gait belt Activity Tolerance: Patient limited by fatigue Patient left: with bed alarm set;with call bell/phone within reach Nurse Communication: Mobility status (family requests assist with feeding) PT Visit Diagnosis: Muscle weakness (generalized) (M62.81);Difficulty in walking, not elsewhere classified (R26.2)    Time: 1350-1415 PT Time Calculation (min) (ACUTE ONLY): 25 min   Charges:   PT Evaluation $PT Eval Low Complexity: 1 Low PT Treatments $Therapeutic Activity: 8-22 mins PT General Charges $$ ACUTE PT VISIT: 1 Visit         Malachi Pro, DPT 12/13/2022, 3:43 PM

## 2022-12-13 NOTE — Progress Notes (Signed)
Speech Language Pathology Treatment: Dysphagia  Patient Details Name: Zachary Fernandez MRN: [id number] DOB: [birth year] Today's Date: 12/13/2022 Time: 0940-1020 SLP Time Calculation (min) (ACUTE ONLY): 40 min  Assessment / Plan / Recommendation Clinical Impression  Pt seen for ongoing assessment of swallowing today in hopes to initiate an oral diet. Pt Less lethargic, drowsy this morning. He opened his eyes, held Cup to drink, and participated in taking po's w/ intermittent verbalizations -- "I need to get my shirt and tie now". Wife present and supported. Pt w/ back brace on d/t fx per chart. NSG reported pt had received sedating medication in last 24 hours -- Ativan at 3:40am.  On RA, afebrile, WBC wnl.   Pt appears to present w/ improving oropharyngeal phase swallowing now that he is more Alert and can engage in self-feeding activities; less lethargy and declined alert State s/p sedating medications being given d/t agitation. Pt has Baseline of ETOH dependence and Pain. ANY sedated State can impact overall awareness/timing of swallow and safety of swallowing during po tasks which increases risk for aspiration/aspiration pneumonia.    Pt consumed trials of ice chip, purees, broken and soft solids, and thin liquids(via Cup) w/ no overt clinical s/s of aspiration noted: no decline in vocal quality, no cough, and no decline in respiratory status during/post trials. O2 sats remained in upper 90s. Pt was able to help hold Cup to drink taking small sips(monitored) which lessens risk for aspiration. Oral phase was improved w/ less impact from sedating State; more appropriate oral phase time overall and functional bolus management and oral clearing. Improved awareness for oral prep phase. Noted min increased mastication time/effort w/ the more solid pieces. Pt required full feeding support and guidance d/t Cognitive decline, distraction. He was able to be redirected to tasks w/ MOD+ cues.      D/t pt's current  State presentation, recommend a Dysphagia level 2 w/ thin liquids Via Cup. Moistened, chopped foods for ease of attention during oral phase -- this consistency can be upgraded to soft solids when pt's State is more consistent (he was a a Regular diet prior to admit). General aspiration precautions. Reduce distractions during meals. Engage pt is self-feeding as much as possible. Do NOT feed/give po's if not Full awake/alert to engage in meal/po's. Rest Breaks during meals/oral intake to allow for Esophageal clearing. REFLUX precautions strongly recommended to lessen chance for Regurgitation in setting of ETOH abuse -- HOB elevated at night when sleeping.  St services will monitor pt's status next 1-3 days for further needs, education. MD/NSG updated. Wife given education, handouts and update on above, agreed. Education had w/ Wife and Team on the risk for aspiration w/ drowsy/lethargic and/or increased confused State; impact of sedating medications on alertness to swallow safely. Asked MD about non-sedating medications for calming.      HPI HPI: Pt is a 82 y.o. male with a PMH significant for Dementia (per Daughter), HTN, HLD, depression, BPH, chronic pain, Alcohol Dependence and Use daily, and recent Fall.  He presented from home to the ED on 12/10/2022 with a fall and back pain. His spouse states that he drinks about half a bottle of hard liquor per day and he sneaks it at night so could be more. His last drink was the night prior to arrival.   CT of Spine - T:  1. Superior endplate fracture at T10 without significant  compression.  2. Intramuscular hematoma in the left paraspinous musculature at the  T10 level.  3. Moderate  foraminal stenosis on the left at T9-10 is exacerbated  by the fracture.   CT of Chest: Trace right pleural effusion with mild overlying atelectasis,  improving compared to the prior CT.  4. Dependent subsegmental atelectasis or consolidation within the  left lung base.  5. Aortic and  coronary artery atherosclerosis      SLP Plan  Continue with current plan of care      Recommendations for follow up therapy are one component of a multi-disciplinary discharge planning process, led by the attending physician.  Recommendations may be updated based on patient status, additional functional criteria and insurance authorization.    Recommendations  Diet recommendations: Dysphagia 2 (fine chop);Thin liquid Liquids provided via: Cup;No straw Medication Administration: Crushed with puree Supervision: Staff to assist with self feeding;Full supervision/cueing for compensatory strategies Compensations: Minimize environmental distractions;Slow rate;Small sips/bites;Lingual sweep for clearance of pocketing;Multiple dry swallows after each bite/sip;Follow solids with liquid Postural Changes and/or Swallow Maneuvers: Out of bed for meals;Seated upright 90 degrees;Upright 30-60 min after meal (REFLUX precs.)                 (Dietician) Oral care BID;Staff/trained caregiver to provide oral care   Frequent or constant Supervision/Assistance (Cognitive decline) Dysphagia, oropharyngeal phase (R13.12) (in setting of lethargy/meds; Dementia and Cognitive decline; ETOH dependence/withdrawal)     Continue with current plan of care      Zachary Som, MS, CCC-SLP Speech Language Pathologist Rehab Services; [facility] [place] [phone] (ascom) Tanor Glaspy  12/13/2022, 2:55 PM

## 2022-12-13 NOTE — Progress Notes (Signed)
OT Cancellation Note  Patient Details Name: Zachary Fernandez MRN: [id number] DOB: 12/11/1940   Cancelled Treatment:    Reason Eval/Treat Not Completed: Other (comment). Chart reviewed, orders received. Discussed with RN - RN has contacted neurosurgery regarding OOB TLSO recommendations. Per RN, neurosurgery to see pt and make recommendation for mobility. OT will check back.   Clebert Wenger L. Josedaniel Haye, OTR/L  12/13/22, 10:50 AM

## 2022-12-14 ENCOUNTER — Inpatient Hospital Stay: Payer: Medicare Other

## 2022-12-14 DIAGNOSIS — S22079A Unspecified fracture of T9-T10 vertebra, initial encounter for closed fracture: Secondary | ICD-10-CM | POA: Diagnosis not present

## 2022-12-14 LAB — MAGNESIUM: Magnesium: 1.8 mg/dL (ref 1.7–2.4)

## 2022-12-14 LAB — LACTIC ACID, PLASMA
Lactic Acid, Venous: 1.4 mmol/L (ref 0.5–1.9)
Lactic Acid, Venous: 1.3 mmol/L (ref 0.5–1.9)

## 2022-12-14 LAB — CBC
HCT: 48.9 % (ref 39.0–52.0)
Hemoglobin: 16.8 g/dL (ref 13.0–17.0)
MCH: 32.2 pg (ref 26.0–34.0)
MCHC: 34.4 g/dL (ref 30.0–36.0)
MCV: 93.7 fL (ref 80.0–100.0)
Platelets: 176 10*3/uL (ref 150–400)
RBC: 5.22 MIL/uL (ref 4.22–5.81)
RDW: 13.2 % (ref 11.5–15.5)
WBC: 14.6 10*3/uL — ABNORMAL HIGH (ref 4.0–10.5)
nRBC: 0 % (ref 0.0–0.2)

## 2022-12-14 LAB — PROCALCITONIN: Procalcitonin: <0.1 ng/mL

## 2022-12-14 LAB — BASIC METABOLIC PANEL
Anion gap: 10 (ref 5–15)
BUN: 10 mg/dL (ref 8–23)
CO2: 23 mmol/L (ref 22–32)
Calcium: 9.2 mg/dL (ref 8.9–10.3)
Chloride: 104 mmol/L (ref 98–111)
Creatinine, Ser: 0.75 mg/dL (ref 0.61–1.24)
GFR, Estimated: >60 mL/min (ref >60)
Glucose, Bld: 156 mg/dL — ABNORMAL HIGH (ref 70–99)
Potassium: 3.6 mmol/L (ref 3.5–5.1)
Sodium: 137 mmol/L (ref 135–145)

## 2022-12-14 LAB — PHOSPHORUS: Phosphorus: 2.9 mg/dL (ref 2.5–4.6)

## 2022-12-14 MED ORDER — PIPERACILLIN-TAZOBACTAM 3.375 G IVPB 30 MIN: 3.3750 g | Freq: Four times a day (QID) | INTRAVENOUS | Status: DC

## 2022-12-14 MED ORDER — TAMSULOSIN HCL 0.4 MG PO CAPS
0.4000 mg | ORAL_CAPSULE | Freq: Every day | ORAL | Status: DC
  Administered 2022-12-14 – 2022-12-19 (×6): 0.4 mg via ORAL
  Filled 2022-12-14 (×5): qty 1

## 2022-12-14 MED ORDER — PIPERACILLIN-TAZOBACTAM 3.375 G IVPB
3.3750 g | Freq: Three times a day (TID) | INTRAVENOUS | Status: DC
Start: 2022-12-14 — End: 2022-12-16
  Administered 2022-12-14 – 2022-12-16 (×7): 3.375 g via INTRAVENOUS
  Filled 2022-12-14 (×7): qty 50

## 2022-12-14 NOTE — Progress Notes (Signed)
   12/14/22 4332  Assess: MEWS Score  Temp (!) 101.6 F (38.7 C)  BP 125/69  MAP (mmHg) 85  Pulse Rate (!) 122  ECG Heart Rate (!) 122  Resp 16  SpO2 96 %  O2 Device Room Air  Assess: MEWS Score  MEWS Temp 2  MEWS Systolic 0  MEWS Pulse 2  MEWS RR 0  MEWS LOC 0  MEWS Score 4  MEWS Score Color Red  Assess: if the MEWS score is Yellow or Red  Were vital signs accurate and taken at a resting state? Yes  Does the patient meet 2 or more of the SIRS criteria? Yes  Does the patient have a confirmed or suspected source of infection? No  MEWS guidelines implemented  Yes, red  Treat  MEWS Interventions Considered administering scheduled or prn medications/treatments as ordered  Take Vital Signs  Increase Vital Sign Frequency  Red: Q1hr x2, continue Q4hrs until patient remains green for 12hrs  Escalate  MEWS: Escalate Red: Discuss with charge nurse and notify provider. Consider notifying RRT. If remains red for 2 hours consider need for higher level of care  Notify: Charge Nurse/RN  Name of Charge Nurse/RN Notified [place], RN  Provider Notification  Provider Name/Title Dr. Lucianne Muss  Date Provider Notified 12/14/22  Time Provider Notified [phone]  Method of Notification Page  Notification Reason Other (Comment) (Red MEWS)  Provider response See new orders  Date of Provider Response 12/14/22  Time of Provider Response [phone]  Assess: SIRS CRITERIA  SIRS Temperature  1  SIRS Pulse 1  SIRS Respirations  0  SIRS WBC 0  SIRS Score Sum  2   Patient alert but drowsy. Pain meds given at 0622. Tylenol given for fever per PRN order. MD placed orders.

## 2022-12-14 NOTE — Progress Notes (Signed)
[facility] Progress Note  Patient: Zachary Fernandez    GMW:[id number]  DOA: 12/10/2022     Date of Service: the patient was seen and examined on 12/14/2022  Chief Complaint  Patient presents with   Fall   Brief hospital course: Zachary Fernandez is a 82 y.o. male with a PMH significant for HTN, HLD, depression, BPH, chronic pain, alcohol dependence. He presented from home to the ED on 12/10/2022 with a fall and back pain. His spouse states that he drinks about half a bottle of hard liquor per day and he sneaks it at night so could be more. His last drink was the night prior to arrival.    Hospital course / significant events:  12/01: admitted to hospitalist service w/ thoracic vertebral fracture and CIWA protocol  12/02: active withdrawals. Started librium taper  12/03: still withdrawals but stable. Confirmed w/ neurosurgery recs against surgical intervention unless worsening    Consultants:  Neurosurgery  Assessment and Plan:  # Acute alcohol withdrawal Last drink was early morning/night of 12/1. continue librium taper (Started 12/02) continue ativan PRN SLP eval done started dysphagia 2 diet, discontinued IV fluids CIWA monitoring   Thoracic vertebral fracture [facility]): As shown by MRI and CT scan. neurosurgery - Dr Marcell Barlow and Dr Katrinka Blazing recommending for non-surgical management, confirmed today w/ Dr Katrinka Blazing TLSO Pain control: As needed morphine, Percocet, Tylenol, Lidoderm patch, As needed Robaxin   Fever and leukocytosis, known source CXR negative UA and urine culture pending Lactic acid negative and procalcitonin negative Started empiric antibiotics Zosyn for now, will de-escalate antibiotics if workup negative   Fall at home, initial encounter Fall precaution PT/OT when able to analgesia PRN   HTN (hypertension):  Continue amlodipine Hold Ziac    Right rib fracture_7th and 8th rib Incentive spirometry Pain control as above   HLD (hyperlipidemia) Crestor   BPH  (benign prostatic hyperplasia) Flomax   Constipation Started laxatives    Body mass index is 26.11 kg/m.  Interventions:  Diet: Dysphagia 2 diet DVT Prophylaxis: Subcutaneous Lovenox   Advance goals of care discussion: Full code  Family Communication: family was present at bedside, at the time of interview.  The pt provided permission to discuss medical plan with the family. Opportunity was given to ask question and all questions were answered satisfactorily.   Disposition:  Pt is from Home, admitted with Fall and fracture of T9/10 spine, EtOH withdrawal, still on Librium tapering dose, which precludes a safe discharge. Discharge to home versus SNF, TBD after PT and OT eval, when stable.  Subjective: No significant events overnight, patient was resting comfortably but he was mumbling, he was a little discomfort due to chest had a BM, need to be cleaned. Patient daughter was at bedside, patient had fever in the morning so we will to workup and started empiric antibiotics.  Management plan discussed.   Physical Exam: General: NAD, lying comfortably Appear in no distress, affect appropriate Eyes: PERRLA ENT: Oral Mucosa Clear, moist  Neck: no JVD,  Cardiovascular: S1 and S2 Present, no Murmur,  Respiratory: good respiratory effort, Bilateral Air entry equal and Decreased, no Crackles, no wheezes Abdomen: Bowel Sound present, Soft and no tenderness,  Skin: no rashes Extremities: no Pedal edema, no calf tenderness Neurologic: without any new focal findings Gait not checked due to patient safety concerns  Vitals:   12/14/22 0812 12/14/22 0915 12/14/22 1022 12/14/22 1116  BP: 125/69 117/67 93/64 109/65  Pulse: (!) 122 (!) 122 (!) 124 (!) 110  Resp: 16 19 18 19   Temp: (!) 101.6 F (38.7 C) 98.4 F (36.9 C) 98.6 F (37 C) 98 F (36.7 C)  TempSrc: Oral Oral Oral Oral  SpO2: 96% 98% 95% 95%  Weight:      Height:        Intake/Output Summary (Last 24 hours) at 12/14/2022  1518 Last data filed at 12/14/2022 1610 Gross per 24 hour  Intake 320.41 ml  Output 1200 ml  Net -879.59 ml   Filed Weights   12/10/22 0800  Weight: 69 kg    Data Reviewed: I have personally reviewed and interpreted daily labs, tele strips, imagings as discussed above. I reviewed all nursing notes, pharmacy notes, vitals, pertinent old records I have discussed plan of care as described above with RN and patient/family.  CBC: Recent Labs  Lab 12/10/22 1812 12/11/22 0628 12/12/22 0559 12/14/22 0534  WBC 6.9 6.3 7.2 14.6*  NEUTROABS 5.1  --   --   --   HGB 12.6* 13.0 15.3 16.8  HCT 37.1* 38.6* 44.9 48.9  MCV 98.9 97.5 94.9 93.7  PLT 123* 124* 139* 176   Basic Metabolic Panel: Recent Labs  Lab 12/10/22 1812 12/11/22 0628 12/12/22 0559 12/13/22 0444 12/14/22 0534  NA 132* 137 136 134* 137  K 4.1 4.2 4.4 3.4* 3.6  CL 97* 99 101 101 104  CO2 27 30 24 22 23   GLUCOSE 101* 120* 102* 147* 156*  BUN 19 14 9 9 10   CREATININE 0.81 0.74 0.62 0.55* 0.75  CALCIUM 8.6* 9.1 9.2 8.4* 9.2  MG  --   --   --  1.9 1.8  PHOS  --   --   --  2.9 2.9    Studies: DG Chest Port 1 View  Result Date: 12/14/2022 CLINICAL DATA:  Fever. EXAM: PORTABLE CHEST 1 VIEW COMPARISON:  October 21, 2022. FINDINGS: The heart size and mediastinal contours are within normal limits. Both lungs are clear. Old right rib fractures are noted. IMPRESSION: No active disease. Electronically Signed   By: Lupita Raider M.D.   On: 12/14/2022 09:51    Scheduled Meds:  bisacodyl  10 mg Oral QHS   chlordiazePOXIDE  25 mg Oral Daily   desipramine  25 mg Oral QHS   enoxaparin (LOVENOX) injection  40 mg Subcutaneous Q24H   lidocaine  1 patch Transdermal Q24H   polyethylene glycol  17 g Oral BID   tamsulosin  0.4 mg Oral QPC supper   thiamine  100 mg Oral Daily   Or   thiamine  100 mg Intravenous Daily   traZODone  50 mg Oral QHS   Continuous Infusions:  piperacillin-tazobactam (ZOSYN)  IV 3.375 g (12/14/22  1420)   PRN Meds: acetaminophen, bisacodyl, HYDROmorphone (DILAUDID) injection, morphine injection, ondansetron (ZOFRAN) IV  Time spent: 35 minutes  Author: Gillis Santa. MD [facility] 12/14/2022 3:18 PM  To reach On-call, see care teams to locate the attending and reach out to them via [email or web address]. If 7PM-7AM, please contact night-coverage If you still have difficulty reaching the attending provider, please page the [facility] (Director on Call) for [facility] on amion for assistance.

## 2022-12-14 NOTE — Progress Notes (Addendum)
Sent secure chat to Dr. Lucianne Muss and made MD aware that patient has not voided yet this shift and bladder scan performed resulted 240 cc. MD acknowledged and instructed RN to perform bladder scan at a later time to reassess if patient has not voiced.

## 2022-12-14 NOTE — Progress Notes (Addendum)
OT Cancellation Note  Patient Details Name: Reid Bastyr MRN: [id number] DOB: 11/04/1940   Cancelled Treatment:    Reason Eval/Treat Not Completed: Medical issues which prohibited therapy Discussed with RN - pt currently with elevated HR and restless, OT to hold eval until this afternoon if appropriate.   Addendum 3:15 pm, Thursday 12/05: discussed with RN PM, pt recovering from fever and bout of agitation. Currently sleeping. Will re-attempt tomorrow if appropriate.     Cydne Grahn L. Sydney Azure, OTR/L  12/14/22, 11:51 AM

## 2022-12-14 NOTE — Progress Notes (Signed)
SLP Cancellation Note  Patient Details Name: Zachary Fernandez MRN: [id number] DOB: [birth year]   Cancelled treatment:       Reason Eval/Treat Not Completed: Fatigue/lethargy limiting ability to participate;Patient's level of consciousness (chart reviewed)  Pt was soundly sleeping and did not fully arouse nor open eyes to MOD++ verbal/tactile stim. Noted he had been agitated this morning per OT note. Pt continue to have ETOH withdrawal issues, back fx/Pain. NSG notes have not indicated any s/s of aspiration w/ meals per chart. ST services will f/u w/ PO check of toleration of diet; aspiration precautions posted in chart/on board -- do NOT feed pt if cannot awaken fully d/t risk for aspiration.      Jerilynn Som, MS, CCC-SLP Speech Language Pathologist Rehab Services; [facility] [phone] (ascom) Lillie Portner 12/14/2022, 4:58 PM

## 2022-12-14 NOTE — Plan of Care (Signed)

## 2022-12-15 DIAGNOSIS — S22079A Unspecified fracture of T9-T10 vertebra, initial encounter for closed fracture: Secondary | ICD-10-CM | POA: Diagnosis not present

## 2022-12-15 LAB — URINALYSIS, W/ REFLEX TO CULTURE (INFECTION SUSPECTED)
Bacteria, UA: NONE SEEN
Bilirubin Urine: NEGATIVE
Glucose, UA: NEGATIVE mg/dL
Ketones, ur: NEGATIVE mg/dL
Nitrite: NEGATIVE
Protein, ur: NEGATIVE mg/dL
Specific Gravity, Urine: 1.025 (ref 1.005–1.030)
pH: 6 (ref 5.0–8.0)

## 2022-12-15 LAB — CBC
HCT: 41.9 % (ref 39.0–52.0)
Hemoglobin: 14.4 g/dL (ref 13.0–17.0)
MCH: 32.8 pg (ref 26.0–34.0)
MCHC: 34.4 g/dL (ref 30.0–36.0)
MCV: 95.4 fL (ref 80.0–100.0)
Platelets: 150 10*3/uL (ref 150–400)
RBC: 4.39 MIL/uL (ref 4.22–5.81)
RDW: 13.3 % (ref 11.5–15.5)
WBC: 10.4 10*3/uL (ref 4.0–10.5)
nRBC: 0 % (ref 0.0–0.2)

## 2022-12-15 LAB — BASIC METABOLIC PANEL
Anion gap: 10 (ref 5–15)
BUN: 14 mg/dL (ref 8–23)
CO2: 25 mmol/L (ref 22–32)
Calcium: 8.7 mg/dL — ABNORMAL LOW (ref 8.9–10.3)
Chloride: 106 mmol/L (ref 98–111)
Creatinine, Ser: 0.88 mg/dL (ref 0.61–1.24)
GFR, Estimated: >60 mL/min (ref >60)
Glucose, Bld: 131 mg/dL — ABNORMAL HIGH (ref 70–99)
Potassium: 3.4 mmol/L — ABNORMAL LOW (ref 3.5–5.1)
Sodium: 141 mmol/L (ref 135–145)

## 2022-12-15 LAB — PHOSPHORUS: Phosphorus: 3.6 mg/dL (ref 2.5–4.6)

## 2022-12-15 LAB — MAGNESIUM: Magnesium: 1.9 mg/dL (ref 1.7–2.4)

## 2022-12-15 MED ORDER — ENSURE ENLIVE PO LIQD
237.0000 mL | Freq: Two times a day (BID) | ORAL | Status: DC
  Administered 2022-12-15 – 2022-12-20 (×10): 237 mL via ORAL

## 2022-12-15 MED ORDER — POTASSIUM CHLORIDE CRYS ER 20 MEQ PO TBCR: 40.0000 meq | EXTENDED_RELEASE_TABLET | Freq: Once | ORAL | Status: DC

## 2022-12-15 MED ORDER — POTASSIUM CHLORIDE 20 MEQ PO PACK
40.0000 meq | PACK | Freq: Once | ORAL | Status: AC
  Administered 2022-12-15: 40 meq via ORAL
  Filled 2022-12-15: qty 2

## 2022-12-15 NOTE — Progress Notes (Signed)
Speech Language Pathology Treatment: Dysphagia  Patient Details Name: Zachary Fernandez MRN: [id number] DOB: [birth year] Today's Date: 12/15/2022 Time: 1510-1530 SLP Time Calculation (min) (ACUTE ONLY): 20 min  Assessment / Plan / Recommendation Clinical Impression  Pt seen at bedside to assess readiness to advance solid textures from Dys2 (finely chopped). Pt was awake and alert, daughter at bedside. Pt accepted trials of thin liquid via cup and straw, as well as graham crackers with peanut butter. Pt required intermittent physical assistance with self feeding. No overt s/s aspiration observed following any PO presentation. Pt's primary issue is deemed to be that of reduced mentation, strength and endurance, rather than physiologically based dysphagia.  At this time, recommend advancing solids to dys 3 (mechanical soft) with chopped meats and extra sauce/gravy, primarily for energy conservation. Pt strength and endurance are improving, however, conservative diet is most appropriate at this point in time. Diet recommendations and safe swallow precautions were reviewed and updated on whiteboard. RN and MD informed of recommendations   HPI HPI: Pt is a 82 y.o. male with a PMH significant for Dementia (per Daughter), HTN, HLD, depression, BPH, chronic pain, Alcohol Dependence and Use daily, and recent Fall.  He presented from home to the ED on 12/10/2022 with a fall and back pain. His spouse states that he drinks about half a bottle of hard liquor per day and he sneaks it at night so could be more. His last drink was the night prior to arrival.   CT of Spine - T:  1. Superior endplate fracture at T10 without significant  compression.  2. Intramuscular hematoma in the left paraspinous musculature at the  T10 level.  3. 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   CT of Chest: Trace right pleural effusion with mild overlying atelectasis,  improving compared to the prior CT.   4. Dependent subsegmental atelectasis or consolidation within the  left lung base.  5. Aortic and coronary artery atherosclerosis      SLP Plan  Continue with current plan of care      Recommendations for follow up therapy are one component of a multi-disciplinary discharge planning process, led by the attending physician.  Recommendations may be updated based on patient status, additional functional criteria and insurance authorization.    Recommendations  Diet recommendations: Dysphagia 3 (mechanical soft);Thin liquid Liquids provided via: Cup;Straw Medication Administration: Whole meds with puree Supervision: Staff to assist with self feeding;Full supervision/cueing for compensatory strategies;Patient able to self feed Compensations: Minimize environmental distractions;Slow rate;Small sips/bites;Lingual sweep for clearance of pocketing;Multiple dry swallows after each bite/sip;Follow solids with liquid Postural Changes and/or Swallow Maneuvers: Out of bed for meals;Seated upright 90 degrees;Upright 30-60 min after meal        Oral care BID;Staff/trained caregiver to provide oral care   Frequent or constant Supervision/Assistance Dysphagia, oropharyngeal phase (R13.12)     Continue with current plan of care    [place] Shannahan B. Murvin Natal, [facility], CCC-SLP Speech Language Pathologist  Leigh Aurora 12/15/2022, 3:59 PM

## 2022-12-15 NOTE — Telephone Encounter (Signed)
Patient is still admitted if anything changes we will cancel the appt.

## 2022-12-15 NOTE — Progress Notes (Signed)
Notified Dr. Lucianne Muss that patient has not voided today and bladder scan volume now >400. MD gave order to in and out cath once and that if patient retains urine again to insert foley catheter.

## 2022-12-15 NOTE — TOC Progression Note (Signed)
Transition of Care [facility]) - Progression Note    Patient Details  Name: Zachary Fernandez MRN: [id number] Date of Birth: 1940/03/11  [facility]) CM/SW Contact  Truddie Hidden, RN Phone Number: 12/15/2022, 2:13 PM  Clinical Narrative:    Spoke with patient's daughter, Sue Lush, regarding SNF recommendation. Sue Lush is agreeable to SNF. She would like [facility] as her first choice.   Bed search started.         Expected Discharge Plan and Services                                               Social Determinants of Health (SDOH) Interventions SDOH Screenings   Food Insecurity: No Food Insecurity (12/12/2022)  Housing: Low Risk  (12/12/2022)  Transportation Needs: No Transportation Needs (12/12/2022)  Utilities: Not At Risk (12/12/2022)  Depression (PHQ2-9): Low Risk  (08/31/2022)  Financial Resource Strain: Patient Declined (11/07/2022)   Received from [facility]  Tobacco Use: Low Risk  (12/10/2022)  Recent Concern: Tobacco Use - Medium Risk (11/07/2022)   Received from [facility]    Readmission Risk Interventions     No data to display

## 2022-12-15 NOTE — Progress Notes (Signed)
[facility] Progress Note  Patient: Zachary Fernandez    ZOX:[id number]  DOA: 12/10/2022     Date of Service: the patient was seen and examined on 12/15/2022  Chief Complaint  Patient presents with   Fall   Brief hospital course: Zachary Fernandez is a 82 y.o. male with a PMH significant for HTN, HLD, depression, BPH, chronic pain, alcohol dependence. He presented from home to the ED on 12/10/2022 with a fall and back pain. His spouse states that he drinks about half a bottle of hard liquor per day and he sneaks it at night so could be more. His last drink was the night prior to arrival.    Hospital course / significant events:  12/01: admitted to hospitalist service w/ thoracic vertebral fracture and CIWA protocol  12/02: active withdrawals. Started librium taper  12/03: still withdrawals but stable. Confirmed w/ neurosurgery recs against surgical intervention unless worsening    Consultants:  Neurosurgery  Assessment and Plan:  # Acute alcohol withdrawal Last drink was early morning/night of 12/1. continue librium taper (Started 12/02) continue ativan PRN SLP eval done started dysphagia 2 diet, discontinued IV fluids CIWA monitoring   Thoracic vertebral fracture [facility]): As shown by MRI and CT scan. neurosurgery - Dr Marcell Barlow and Dr Katrinka Blazing recommending for non-surgical management, confirmed today w/ Dr Katrinka Blazing TLSO Pain control: As needed morphine, Percocet, Tylenol, Lidoderm patch, As needed Robaxin   Fever and leukocytosis, known source CXR negative UA and urine culture was not sent last night by RN on 12/5 Lactic acid negative and procalcitonin negative Started empiric antibiotics Zosyn for now, will de-escalate antibiotics if workup negative 12/6 remained afebrile.  Fall at home, initial encounter Fall precaution PT/OT when able to analgesia PRN   HTN (hypertension):  Continue amlodipine Hold Ziac    Right rib fracture_7th and 8th rib Incentive spirometry Pain  control as above   HLD (hyperlipidemia) Crestor   BPH (benign prostatic hyperplasia) Flomax   Constipation Started laxatives   Hypokalemia, mild, potassium repleted. Monitor electrolytes.   Body mass index is 26.11 kg/m.  Interventions:  Diet: Dysphagia 3 diet DVT Prophylaxis: Subcutaneous Lovenox   Advance goals of care discussion: Full code  Family Communication: family was present at bedside, at the time of interview.  The pt provided permission to discuss medical plan with the family. Opportunity was given to ask question and all questions were answered satisfactorily.   Disposition:  Pt is from Home, admitted with Fall and fracture of T9/10 spine, EtOH withdrawal, s/p Librium tapering dose, stable to dc now. Discharge to SNF.  Follow TOC, patient is stable to discharge when bed will be available.   Subjective: No significant events overnight, patient feeling improvement, no pain at rest but it hurts on movement 9/10.  In the morning time patient had 1 episode of vomiting after eating breakfast, no abdominal pain.  Patient is moving bowels, no constipation or diarrhea.  Denies dysuria.   Physical Exam: General: NAD, lying comfortably Appear in no distress, affect appropriate Eyes: PERRLA ENT: Oral Mucosa Clear, moist  Neck: no JVD,  Cardiovascular: S1 and S2 Present, no Murmur,  Respiratory: good respiratory effort, Bilateral Air entry equal and Decreased, no Crackles, no wheezes Abdomen: Bowel Sound present, Soft and no tenderness,  Skin: no rashes Extremities: no Pedal edema, no calf tenderness Neurologic: without any new focal findings Gait not checked due to patient safety concerns  Vitals:   12/15/22 0402 12/15/22 0744 12/15/22 1250 12/15/22 1540  BP: 132/83  131/81 123/79 119/77  Pulse: 91 (!) 103 99 97  Resp: 17 20 20 16   Temp: 97.9 F (36.6 C) (!) 97.4 F (36.3 C) 98 F (36.7 C) 98 F (36.7 C)  TempSrc:  Oral  Oral  SpO2: 96% 98% 97% 97%  Weight:       Height:        Intake/Output Summary (Last 24 hours) at 12/15/2022 1739 Last data filed at 12/15/2022 1434 Gross per 24 hour  Intake 388.95 ml  Output 500 ml  Net -111.05 ml   Filed Weights   12/10/22 0800  Weight: 69 kg    Data Reviewed: I have personally reviewed and interpreted daily labs, tele strips, imagings as discussed above. I reviewed all nursing notes, pharmacy notes, vitals, pertinent old records I have discussed plan of care as described above with RN and patient/family.  CBC: Recent Labs  Lab 12/10/22 1812 12/11/22 0628 12/12/22 0559 12/14/22 0534 12/15/22 0607  WBC 6.9 6.3 7.2 14.6* 10.4  NEUTROABS 5.1  --   --   --   --   HGB 12.6* 13.0 15.3 16.8 14.4  HCT 37.1* 38.6* 44.9 48.9 41.9  MCV 98.9 97.5 94.9 93.7 95.4  PLT 123* 124* 139* 176 150   Basic Metabolic Panel: Recent Labs  Lab 12/11/22 0628 12/12/22 0559 12/13/22 0444 12/14/22 0534 12/15/22 0607  NA 137 136 134* 137 141  K 4.2 4.4 3.4* 3.6 3.4*  CL 99 101 101 104 106  CO2 30 24 22 23 25   GLUCOSE 120* 102* 147* 156* 131*  BUN 14 9 9 10 14   CREATININE 0.74 0.62 0.55* 0.75 0.88  CALCIUM 9.1 9.2 8.4* 9.2 8.7*  MG  --   --  1.9 1.8 1.9  PHOS  --   --  2.9 2.9 3.6    Studies: No results found.  Scheduled Meds:  bisacodyl  10 mg Oral QHS   desipramine  25 mg Oral QHS   enoxaparin (LOVENOX) injection  40 mg Subcutaneous Q24H   feeding supplement  237 mL Oral BID BM   lidocaine  1 patch Transdermal Q24H   polyethylene glycol  17 g Oral BID   tamsulosin  0.4 mg Oral QPC supper   thiamine  100 mg Oral Daily   Or   thiamine  100 mg Intravenous Daily   traZODone  50 mg Oral QHS   Continuous Infusions:  piperacillin-tazobactam (ZOSYN)  IV 3.375 g (12/15/22 1337)   PRN Meds: acetaminophen, bisacodyl, HYDROmorphone (DILAUDID) injection, morphine injection, ondansetron (ZOFRAN) IV  Time spent: 35 minutes  Author: Gillis Santa. MD [facility] 12/15/2022 5:39 PM  To reach  On-call, see care teams to locate the attending and reach out to them via [email or web address]. If 7PM-7AM, please contact night-coverage If you still have difficulty reaching the attending provider, please page the [facility] (Director on Call) for [facility] on amion for assistance.

## 2022-12-15 NOTE — Progress Notes (Signed)
PT Cancellation Note  Patient Details Name: Zachary Fernandez MRN: [id number] DOB: 12/22/1940   Cancelled Treatment:    Reason Eval/Treat Not Completed: Medical issues which prohibited therapy;Fatigue/lethargy limiting ability to participate (authro arrived to room for PT session, wife and RN conversationating at bedside. Pt recently s/p emesis, then antiemetic, now asleep. A group decision is made to push back his PT session to another time.)   [name] 12/15/2022, 11:39 AM
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
## 2022-12-15 NOTE — Telephone Encounter (Signed)
It looks like [facility] opened up next Wednesday if the pt can switch to that.Marland KitchenMarland Kitchen

## 2022-12-15 NOTE — Evaluation (Signed)
Occupational Therapy Evaluation Patient Details Name: Zachary Fernandez MRN: [id number] DOB: 1940/05/24 Today's Date: 12/15/2022   History of Present Illness 82 y.o. male with a PMH significant for HTN, HLD, depression, BPH, chronic pain, alcohol dependence. He presented from home to the ED on 12/10/2022 with a fall and back pain, found to have T10 fracture. His spouse states that he drinks about half a bottle of hard liquor per day and he sneaks it at night so could be more.   Clinical Impression   Prior to hospital admission, pt was independent, driving and going to the gym 3x weekly. Pt lives with spouse, who can provide 24/7 supervision but not physical assist upon d/c. RN cleared pt to participate in OT eval.   Pt currently requires minA for self-feeding (due to level of arousal), minA for bed level UB ADLs, max-total for LB ADLs bed level.  Assessment limited on eval. Max-totalA for rolling using log roll technique, anticipate +2 required to transition pt safely to EOB. Pt HOH, fluctuating level of arousal throughout, initally A&Ox4, joking with OT, but becomes sleepy and closes eyes. Spouse indicates she feels like he is continuing to detox from ETOH. Pt would benefit from skilled OT services to address noted impairments and functional limitations (see below for any additional details) in order to maximize safety and independence while minimizing falls risk and caregiver burden. Patient will benefit from continued inpatient follow up therapy, <3 hours/day.     If plan is discharge home, recommend the following: Two people to help with walking and/or transfers;Two people to help with bathing/dressing/bathroom;Assistance with feeding;Assistance with cooking/housework;Assist for transportation;Supervision due to cognitive status    Functional Status Assessment  Patient has had a recent decline in their functional status and demonstrates the ability to make significant improvements in function in a  reasonable and predictable amount of time.  Equipment Recommendations  Other (comment) (defer to next LOC)    Recommendations for Other Services PT consult     Precautions / Restrictions Precautions Precautions: Back Required Braces or Orthoses: Spinal Brace Spinal Brace: Thoracolumbosacral orthotic (when OOB) Restrictions Weight Bearing Restrictions: No      Mobility Bed Mobility Overal bed mobility: Needs Assistance Bed Mobility: Rolling Rolling: Max assist         General bed mobility comments: Pt with minimal effort pusing through RLE to attempt log roll technique, OT uses bed pad and pt able to roll to side with max assist. Will need +2 for physical assistance to transition pt safely to sitting EOB with back precautions    Transfers                   General transfer comment: NT      Balance Overall balance assessment: History of Falls                                         ADL either performed or assessed with clinical judgement   ADL Overall ADL's : Needs assistance/impaired Eating/Feeding: Minimal assistance;Sitting Eating/Feeding Details (indicate cue type and reason): currently requiring staff or spouse to help feed Grooming: Minimal assistance;Bed level   Upper Body Bathing: Bed level;Minimal assistance   Lower Body Bathing: Bed level;Maximal assistance;+2 for physical assistance   Upper Body Dressing : Minimal assistance;Bed level   Lower Body Dressing: Total assistance;+2 for physical assistance;Bed level   Toilet Transfer: BSC/3in1;+2 for safety/equipment;+2 for  physical assistance;Maximal assistance;Rolling walker (2 wheels)   Toileting- Clothing Manipulation and Hygiene: Bed level;Total assistance;+2 for physical assistance       Functional mobility during ADLs: Rolling walker (2 wheels);Maximal assistance;+2 for physical assistance General ADL Comments: Limited eval with pt's breakfast tray arriving and spouse/pt  desiring to eat as pt has not eaten in 24 hours. Pt currently requiring minA for self-feeding due to level of alertness (currently detoxing). Anticipate pt requiring minA for bed level UB ADLs, MAX-TOTAL for LB ADLs with +2 for OOB/transfers.     Vision Baseline Vision/History: 1 Wears glasses Ability to See in Adequate Light: 0 Adequate Patient Visual Report: No change from baseline Vision Assessment?: Wears glasses for reading            Pertinent Vitals/Pain Pain Assessment Pain Assessment: 0-10 Pain Score: 3  Pain Location: mild back pain with movement, no pain at rst Pain Descriptors / Indicators: Discomfort, Grimacing, Guarding, Aching Pain Intervention(s): Limited activity within patient's tolerance, Premedicated before session     Extremity/Trunk Assessment Upper Extremity Assessment Upper Extremity Assessment: Generalized weakness;Right hand dominant   Lower Extremity Assessment Lower Extremity Assessment: Defer to PT evaluation;Generalized weakness   Cervical / Trunk Assessment Cervical / Trunk Assessment: Other exceptions Cervical / Trunk Exceptions: T10 fx   Communication Communication Communication: Hearing impairment   Cognition Arousal: Alert (A&Ox4, slightly drowsy) Behavior During Therapy: WFL for tasks assessed/performed Overall Cognitive Status: Impaired/Different from baseline Area of Impairment: Following commands                       Following Commands: Follows one step commands inconsistently                Exercises Exercises: Other exercises Other Exercises Other Exercises: Edu provided to pt and spouse regarding role and purpose of OT in acute setting, therapy frequency, discharge recommedations Other Exercises: Educated on use of TLSO when OOB, back precautions, implications for functional tasks, log roll technique for bed mobility, spouse taking notes and verbalizing understanding   Shoulder Instructions      Home Living  Family/patient expects to be discharged to:: Skilled nursing facility Living Arrangements: Spouse/significant other Available Help at Discharge: Family;Available 24 hours/day   Home Access: Elevator;Level entry     Home Layout: One level               Home Equipment: Agricultural consultant (2 wheels);BSC/3in1;Cane - single point          Prior Functioning/Environment Prior Level of Function : Independent/Modified Independent             Mobility Comments: apparently he was driving himself to the gym 3x/week and was able to be quite active ADLs Comments: independent        OT Problem List: Decreased strength;Decreased range of motion;Decreased activity tolerance;Impaired balance (sitting and/or standing);Pain;Decreased knowledge of precautions;Decreased knowledge of use of DME or AE      OT Treatment/Interventions: Self-care/ADL training;Therapeutic exercise;DME and/or AE instruction;Therapeutic activities;Patient/family education;Balance training    OT Goals(Current goals can be found in the care plan section) Acute Rehab OT Goals OT Goal Formulation: Patient unable to participate in goal setting Time For Goal Achievement: 12/29/22 Potential to Achieve Goals: Good  OT Frequency: Min 1X/week       AM-PAC OT "6 Clicks" Daily Activity     Outcome Measure Help from another person eating meals?: A Little Help from another person taking care of personal grooming?: A Little Help from  another person toileting, which includes using toliet, bedpan, or urinal?: Total Help from another person bathing (including washing, rinsing, drying)?: Total Help from another person to put on and taking off regular upper body clothing?: Total Help from another person to put on and taking off regular lower body clothing?: Total 6 Click Score: 10   End of Session Nurse Communication: Mobility status  Activity Tolerance: Patient limited by fatigue Patient left: in bed;with call bell/phone  within reach;with bed alarm set;with family/visitor present;with SCD's reapplied  OT Visit Diagnosis: Muscle weakness (generalized) (M62.81);Unsteadiness on feet (R26.81);Other abnormalities of gait and mobility (R26.89);Pain Pain - part of body:  (back)                Time: 4098-1191 OT Time Calculation (min): 32 min Charges:  OT General Charges $OT Visit: 1 Visit OT Evaluation $OT Eval Low Complexity: 1 Low OT Treatments $Self Care/Home Management : 8-22 mins  Elberta Lachapelle L. Levell Tavano, OTR/L  12/15/22, 12:37 PM

## 2022-12-16 DIAGNOSIS — S22079A Unspecified fracture of T9-T10 vertebra, initial encounter for closed fracture: Secondary | ICD-10-CM | POA: Diagnosis not present

## 2022-12-16 LAB — CBC
HCT: 39.8 % (ref 39.0–52.0)
Hemoglobin: 13.6 g/dL (ref 13.0–17.0)
MCH: 32.4 pg (ref 26.0–34.0)
MCHC: 34.2 g/dL (ref 30.0–36.0)
MCV: 94.8 fL (ref 80.0–100.0)
Platelets: 157 10*3/uL (ref 150–400)
RBC: 4.2 MIL/uL — ABNORMAL LOW (ref 4.22–5.81)
RDW: 13.3 % (ref 11.5–15.5)
WBC: 7.4 10*3/uL (ref 4.0–10.5)
nRBC: 0 % (ref 0.0–0.2)

## 2022-12-16 LAB — BASIC METABOLIC PANEL
Anion gap: 8 (ref 5–15)
BUN: 10 mg/dL (ref 8–23)
CO2: 25 mmol/L (ref 22–32)
Calcium: 8.7 mg/dL — ABNORMAL LOW (ref 8.9–10.3)
Chloride: 106 mmol/L (ref 98–111)
Creatinine, Ser: 0.74 mg/dL (ref 0.61–1.24)
GFR, Estimated: >60 mL/min (ref >60)
Glucose, Bld: 122 mg/dL — ABNORMAL HIGH (ref 70–99)
Potassium: 3.7 mmol/L (ref 3.5–5.1)
Sodium: 139 mmol/L (ref 135–145)

## 2022-12-16 LAB — PHOSPHORUS: Phosphorus: 3.9 mg/dL (ref 2.5–4.6)

## 2022-12-16 LAB — MAGNESIUM: Magnesium: 1.9 mg/dL (ref 1.7–2.4)

## 2022-12-16 NOTE — Plan of Care (Signed)

## 2022-12-16 NOTE — Progress Notes (Signed)
Physical Therapy Treatment Patient Details Name: Zachary Fernandez MRN: [id number] DOB: 29-Jul-1940 Today's Date: 12/16/2022   History of Present Illness 82 y.o. male with a PMH significant for HTN, HLD, depression, BPH, chronic pain, alcohol dependence. He presented from home to the ED on 12/10/2022 with a fall and back pain, found to have T10 fracture. His spouse states that he drinks about half a bottle of hard liquor per day and he sneaks it at night so could be more.    PT Comments  Pt was long sitting in bed upon arrival. He is alert and agreeable to session. Spouse present and able to confirm PLOF. Pt did endorse some L side pain in bed however pain did not limit session. He was able to roll to R side and achieve short sit EOB with min-mod assist fo 1. Sat EOB for several minutes. Required extensive assistance to properly wear/ put TLSO on. Session progressed to standing and ambulating into hallway. He tolerated gait ~ 50 ft prior to feeling fatigue. At conclusion of session, pt was sitting in recliner with call bell in reach, chair alarm in place, and spouse at bedside. RN aware of his abilities. DC recs remain appropriate.    If plan is discharge home, recommend the following: A lot of help with walking and/or transfers;A lot of help with bathing/dressing/bathroom;Assistance with cooking/housework;Direct supervision/assist for medications management;Direct supervision/assist for financial management;Assist for transportation;Help with stairs or ramp for entrance;Supervision due to cognitive status     Equipment Recommendations  Rolling walker (2 wheels)       Precautions / Restrictions Precautions Precautions: Back Precaution Booklet Issued: No Required Braces or Orthoses: Spinal Brace Spinal Brace: Thoracolumbosacral orthotic Restrictions Weight Bearing Restrictions: No     Mobility  Bed Mobility Overal bed mobility: Needs Assistance Bed Mobility: Rolling Rolling: Min assist Supine  to sit: Min assist, Mod assist  General bed mobility comments: pt required less assistance to roll R to short sit. vcs for sequencing throughout but pt endorses "much less painful today."    Transfers Overall transfer level: Needs assistance Equipment used: Rolling walker (2 wheels) Transfers: Sit to/from Stand Sit to Stand: Min assist, From elevated surface  General transfer comment: Pt required extensive assistance to safely/ correctly apply TLSO    Ambulation/Gait Ambulation/Gait assistance: Contact guard assist Gait Distance (Feet): 50 Feet Assistive device: Rolling walker (2 wheels) Gait Pattern/deviations: Step-through pattern, Narrow base of support, Decreased stride length Gait velocity: decreased  General Gait Details: Pt was able to ambulate ~ 50 ft with RW. Overall tolerated well but fatigued quickly. vital were stable throughout.   Balance Overall balance assessment: History of Falls Sitting-balance support: Bilateral upper extremity supported Sitting balance-Leahy Scale: Fair     Standing balance support: Bilateral upper extremity supported, During functional activity, Reliant on assistive device for balance Standing balance-Leahy Scale: Fair     Cognition Arousal: Alert Behavior During Therapy: WFL for tasks assessed/performed Overall Cognitive Status: Impaired/Different from baseline (however per family," greatly improving.") Area of Impairment: Awareness    Following Commands: Follows one step commands consistently, Follows multi-step commands with increased time   Awareness: Intellectual   General Comments: Per family, cognition greatly improving           General Comments General comments (skin integrity, edema, etc.): Discussed DC recommendation with pt's spouse and pt. Both are in agreement that rehab prior to returning home remains most appropriate      Pertinent Vitals/Pain Pain Assessment Pain Assessment: 0-10 Pain Score: 3  Pain Location: "L  side" Pain Descriptors / Indicators: Discomfort, Grimacing, Guarding, Aching Pain Intervention(s): Limited activity within patient's tolerance, Monitored during session, Premedicated before session, Repositioned     PT Goals (current goals can now be found in the care plan section) Acute Rehab PT Goals Patient Stated Goal: rehab then home Progress towards PT goals: Progressing toward goals    Frequency    Min 1X/week       AM-PAC PT "6 Clicks" Mobility   Outcome Measure  Help needed turning from your back to your side while in a flat bed without using bedrails?: A Little Help needed moving from lying on your back to sitting on the side of a flat bed without using bedrails?: A Lot Help needed moving to and from a bed to a chair (including a wheelchair)?: A Lot Help needed standing up from a chair using your arms (e.g., wheelchair or bedside chair)?: A Lot Help needed to walk in hospital room?: A Little Help needed climbing 3-5 steps with a railing? : A Lot 6 Click Score: 14    End of Session   Activity Tolerance: Patient tolerated treatment well;Patient limited by fatigue Patient left: in chair;with call bell/phone within reach;with chair alarm set;with family/visitor present Nurse Communication: Mobility status PT Visit Diagnosis: Muscle weakness (generalized) (M62.81);Difficulty in walking, not elsewhere classified (R26.2)     Time: 1610-9604 PT Time Calculation (min) (ACUTE ONLY): 24 min  Charges:    $Gait Training: 8-22 mins $Therapeutic Activity: 8-22 mins PT General Charges $$ ACUTE PT VISIT: 1 Visit                     Jetta Lout PTA 12/16/22, 1:24 PM

## 2022-12-16 NOTE — Progress Notes (Signed)
[facility] Progress Note  Patient: Zachary Fernandez    ZOX:[id number]  DOA: 12/10/2022     Date of Service: the patient was seen and examined on 12/16/2022  Chief Complaint  Patient presents with   Fall   Brief hospital course: Savone Wehri is a 82 y.o. male with a PMH significant for HTN, HLD, depression, BPH, chronic pain, alcohol dependence. He presented from home to the ED on 12/10/2022 with a fall and back pain. His spouse states that he drinks about half a bottle of hard liquor per day and he sneaks it at night so could be more. His last drink was the night prior to arrival.    Hospital course / significant events:  12/01: admitted to hospitalist service w/ thoracic vertebral fracture and CIWA protocol  12/02: active withdrawals. Started librium taper  12/03: still withdrawals but stable. Confirmed w/ neurosurgery recs against surgical intervention unless worsening    Consultants:  Neurosurgery  Assessment and Plan:  # Acute alcohol withdrawal Last drink was early morning/night of 12/1. continue librium taper (Started 12/02) continue ativan PRN SLP eval done started dysphagia 2 diet, discontinued IV fluids CIWA monitoring   Thoracic vertebral fracture: As shown by MRI and CT scan. neurosurgery - Dr Marcell Barlow and Dr Katrinka Blazing recommending for non-surgical management, confirmed today w/ Dr Katrinka Blazing TLSO Pain control: As needed morphine, Percocet, Tylenol, Lidoderm patch, As needed Robaxin   Fever and leukocytosis, known source CXR negative UA and urine culture was not sent last night by RN on 12/5 Lactic acid negative and procalcitonin negative S/p empiric antibiotics Zosyn for days, no signs of infection.  DC'd antibiotics on 12/7 Monitor off antibiotics  Fall at home, initial encounter Fall precaution PT/OT when able to analgesia PRN   HTN (hypertension):  Continue amlodipine Hold Ziac    Right rib fracture_7th and 8th rib Incentive spirometry Pain control as  above   HLD (hyperlipidemia) Crestor   BPH (benign prostatic hyperplasia) Flomax   Constipation, resolved, Started laxatives   Hypokalemia, mild, potassium repleted. Monitor electrolytes.   Body mass index is 26.11 kg/m.  Interventions:  Diet: Dysphagia 3 diet DVT Prophylaxis: Subcutaneous Lovenox   Advance goals of care discussion: Full code  Family Communication: family was present at bedside, at the time of interview.  The pt provided permission to discuss medical plan with the family. Opportunity was given to ask question and all questions were answered satisfactorily.   Disposition:  Pt is from Home, admitted with Fall and fracture of T9/10 spine, EtOH withdrawal, s/p Librium tapering dose, stable to dc now. Discharge to SNF.  Follow TOC, patient is stable to discharge when bed will be available.   Subjective: No significant events overnight, very little pain at rest but it hurts a lot to 10/10 on movement.  No any other complaints.   Physical Exam: General: NAD, lying comfortably Appear in no distress, affect appropriate Eyes: PERRLA ENT: Oral Mucosa Clear, moist  Neck: no JVD,  Cardiovascular: S1 and S2 Present, no Murmur,  Respiratory: good respiratory effort, Bilateral Air entry equal and Decreased, no Crackles, no wheezes Abdomen: Bowel Sound present, Soft and no tenderness,  Skin: no rashes Extremities: no Pedal edema, no calf tenderness Neurologic: without any new focal findings Gait not checked due to patient safety concerns  Vitals:   12/16/22 0033 12/16/22 0338 12/16/22 0831 12/16/22 1320  BP: 138/75 117/71 135/79 137/78  Pulse: (!) 106 (!) 101 95 (!) 106  Resp: 17 17 17 17   Temp:  97.8 F (36.6 C) 98 F (36.7 C) (!) 97.5 F (36.4 C) 98.1 F (36.7 C)  TempSrc:      SpO2: 94% 94% 99% 94%  Weight:      Height:        Intake/Output Summary (Last 24 hours) at 12/16/2022 1546 Last data filed at 12/16/2022 0300 Gross per 24 hour  Intake 101.14  ml  Output 1200 ml  Net -1098.86 ml   Filed Weights   12/10/22 0800  Weight: 69 kg    Data Reviewed: I have personally reviewed and interpreted daily labs, tele strips, imagings as discussed above. I reviewed all nursing notes, pharmacy notes, vitals, pertinent old records I have discussed plan of care as described above with RN and patient/family.  CBC: Recent Labs  Lab 12/10/22 1812 12/11/22 0628 12/12/22 0559 12/14/22 0534 12/15/22 0607 12/16/22 0543  WBC 6.9 6.3 7.2 14.6* 10.4 7.4  NEUTROABS 5.1  --   --   --   --   --   HGB 12.6* 13.0 15.3 16.8 14.4 13.6  HCT 37.1* 38.6* 44.9 48.9 41.9 39.8  MCV 98.9 97.5 94.9 93.7 95.4 94.8  PLT 123* 124* 139* 176 150 157   Basic Metabolic Panel: Recent Labs  Lab 12/12/22 0559 12/13/22 0444 12/14/22 0534 12/15/22 0607 12/16/22 0543  NA 136 134* 137 141 139  K 4.4 3.4* 3.6 3.4* 3.7  CL 101 101 104 106 106  CO2 24 22 23 25 25   GLUCOSE 102* 147* 156* 131* 122*  BUN 9 9 10 14 10   CREATININE 0.62 0.55* 0.75 0.88 0.74  CALCIUM 9.2 8.4* 9.2 8.7* 8.7*  MG  --  1.9 1.8 1.9 1.9  PHOS  --  2.9 2.9 3.6 3.9    Studies: No results found.  Scheduled Meds:  bisacodyl  10 mg Oral QHS   desipramine  25 mg Oral QHS   enoxaparin (LOVENOX) injection  40 mg Subcutaneous Q24H   feeding supplement  237 mL Oral BID BM   lidocaine  1 patch Transdermal Q24H   polyethylene glycol  17 g Oral BID   tamsulosin  0.4 mg Oral QPC supper   thiamine  100 mg Oral Daily   Or   thiamine  100 mg Intravenous Daily   traZODone  50 mg Oral QHS   Continuous Infusions:   PRN Meds: acetaminophen, bisacodyl, HYDROmorphone (DILAUDID) injection, morphine injection, ondansetron (ZOFRAN) IV  Time spent: 35 minutes  Author: Gillis Santa. MD [facility] 12/16/2022 3:46 PM  To reach On-call, see care teams to locate the attending and reach out to them via [email or web address]. If 7PM-7AM, please contact night-coverage If you still have difficulty  reaching the attending provider, please page the [facility] (Director on Call) for [facility] on amion for assistance.

## 2022-12-17 DIAGNOSIS — S22079A Unspecified fracture of T9-T10 vertebra, initial encounter for closed fracture: Secondary | ICD-10-CM | POA: Diagnosis not present

## 2022-12-17 LAB — CBC
HCT: 43.8 % (ref 39.0–52.0)
Hemoglobin: 14.9 g/dL (ref 13.0–17.0)
MCH: 32.1 pg (ref 26.0–34.0)
MCHC: 34 g/dL (ref 30.0–36.0)
MCV: 94.4 fL (ref 80.0–100.0)
Platelets: 200 10*3/uL (ref 150–400)
RBC: 4.64 MIL/uL (ref 4.22–5.81)
RDW: 13.1 % (ref 11.5–15.5)
WBC: 9.5 10*3/uL (ref 4.0–10.5)
nRBC: 0 % (ref 0.0–0.2)

## 2022-12-17 LAB — BASIC METABOLIC PANEL
Anion gap: 11 (ref 5–15)
BUN: 9 mg/dL (ref 8–23)
CO2: 23 mmol/L (ref 22–32)
Calcium: 8.7 mg/dL — ABNORMAL LOW (ref 8.9–10.3)
Chloride: 100 mmol/L (ref 98–111)
Creatinine, Ser: 0.64 mg/dL (ref 0.61–1.24)
GFR, Estimated: >60 mL/min (ref >60)
Glucose, Bld: 116 mg/dL — ABNORMAL HIGH (ref 70–99)
Potassium: 3.7 mmol/L (ref 3.5–5.1)
Sodium: 134 mmol/L — ABNORMAL LOW (ref 135–145)

## 2022-12-17 LAB — URINE CULTURE: Culture: <10000 — AB

## 2022-12-17 MED ORDER — METOPROLOL TARTRATE 25 MG PO TABS
12.5000 mg | ORAL_TABLET | Freq: Two times a day (BID) | ORAL | Status: DC
  Administered 2022-12-17 – 2022-12-20 (×7): 12.5 mg via ORAL
  Filled 2022-12-17 (×7): qty 1

## 2022-12-17 NOTE — Plan of Care (Signed)

## 2022-12-17 NOTE — Progress Notes (Addendum)
[facility] Progress Note  Patient: Zachary Fernandez    RUE:[id number]  DOA: 12/10/2022     Date of Service: the patient was seen and examined on 12/17/2022  Chief Complaint  Patient presents with   Fall   Brief hospital course: Zachary Fernandez is a 82 y.o. male with a PMH significant for HTN, HLD, depression, BPH, chronic pain, alcohol dependence. He presented from home to the ED on 12/10/2022 with a fall and back pain. His spouse states that he drinks about half a bottle of hard liquor per day and he sneaks it at night so could be more. His last drink was the night prior to arrival.    Hospital course / significant events:  12/01: admitted to hospitalist service w/ thoracic vertebral fracture and CIWA protocol  12/02: active withdrawals. Started librium taper  12/03: still withdrawals but stable. Confirmed w/ neurosurgery recs against surgical intervention unless worsening    Consultants:  Spine/Neurosurgery  Assessment and Plan:  # Acute alcohol withdrawal Last drink was early morning/night of 12/1. S/p librium taper, continue ativan PRN SLP eval done started dysphagia 3 diet, discontinued IV fluids CIWA monitoring   # Thoracic vertebral fracture: As shown by MRI and CT scan. neurosurgery - Dr Marcell Barlow and Dr Katrinka Blazing recommending for non-surgical management, confirmed today w/ Dr Katrinka Blazing.  Continue TLSO brace when out of bed. Pain control: As needed morphine, Percocet, Tylenol, Lidoderm patch, As needed Robaxin   # Fever and leukocytosis, known source. Resolved CXR negative UA and urine culture was not sent last night by RN on 12/5 Lactic acid negative and procalcitonin negative S/p empiric antibiotics Zosyn for days, no signs of infection.  DC'd antibiotics on 12/7 Monitor off antibiotics  # Fall at home, initial encounter Continue fall precaution, PT/OT eval done, recommended SNF placement.   analgesia PRN   # HTN (hypertension):  Patient was on bisoprolol-HCTZ 5-6.25  mg, which was help on admission Started Lopressor 12.5 mg p.o. twice daily Monitor BP and heart rate, titrate medication accordingly.    # Right rib fracture_7th and 8th rib Continue incentive spirometry Pain control as above   # HLD (hyperlipidemia): Crestor   # BPH (benign prostatic hyperplasia): Flomax # Constipation, resolved, continue laxatives # Hypokalemia, mild, potassium repleted.  Monitor electrolytes.   Body mass index is 26.11 kg/m.  Interventions:  Diet: Dysphagia 3 diet DVT Prophylaxis: Subcutaneous Lovenox   Advance goals of care discussion: Full code  Family Communication: family was present at bedside, at the time of interview.  The pt provided permission to discuss medical plan with the family. Opportunity was given to ask question and all questions were answered satisfactorily.   Disposition:  Pt is from Home, admitted with Fall and fracture of T9/10 spine, EtOH withdrawal, s/p Librium tapering dose, stable to dc now. Discharge to SNF.  Follow TOC, patient is stable to discharge when bed will be available.   Subjective: No significant events overnight, complaining of back pain 7/10 on the movement more on the left side than right.  No any other complaints.   Physical Exam: General: NAD, lying comfortably Appear in no distress, affect appropriate Eyes: PERRLA ENT: Oral Mucosa Clear, moist  Neck: no JVD,  Cardiovascular: S1 and S2 Present, no Murmur,  Respiratory: good respiratory effort, Bilateral Air entry equal and Decreased, no Crackles, no wheezes Abdomen: Bowel Sound present, Soft and no tenderness,  Skin: no rashes Extremities: no Pedal edema, no calf tenderness Neurologic: without any new focal findings Gait not  checked due to patient safety concerns  Vitals:   12/17/22 0415 12/17/22 0800 12/17/22 1215 12/17/22 1309  BP: 125/67 137/84 (!) 115/93   Pulse: 96 (!) 104 (!) 104   Resp: 18     Temp: 97.8 F (36.6 C) 97.8 F (36.6 C) (!) 96.7  F (35.9 C) 97.6 F (36.4 C)  TempSrc: Oral Oral Axillary Oral  SpO2: 97% 96% 100%   Weight:      Height:        Intake/Output Summary (Last 24 hours) at 12/17/2022 1342 Last data filed at 12/17/2022 0800 Gross per 24 hour  Intake 360 ml  Output 650 ml  Net -290 ml   Filed Weights   12/10/22 0800  Weight: 69 kg    Data Reviewed: I have personally reviewed and interpreted daily labs, tele strips, imagings as discussed above. I reviewed all nursing notes, pharmacy notes, vitals, pertinent old records I have discussed plan of care as described above with RN and patient/family.  CBC: Recent Labs  Lab 12/10/22 1812 12/11/22 0628 12/12/22 0559 12/14/22 0534 12/15/22 0607 12/16/22 0543 12/17/22 0643  WBC 6.9   < > 7.2 14.6* 10.4 7.4 9.5  NEUTROABS 5.1  --   --   --   --   --   --   HGB 12.6*   < > 15.3 16.8 14.4 13.6 14.9  HCT 37.1*   < > 44.9 48.9 41.9 39.8 43.8  MCV 98.9   < > 94.9 93.7 95.4 94.8 94.4  PLT 123*   < > 139* 176 150 157 200   < > = values in this interval not displayed.   Basic Metabolic Panel: Recent Labs  Lab 12/13/22 0444 12/14/22 0534 12/15/22 0607 12/16/22 0543 12/17/22 0643  NA 134* 137 141 139 134*  K 3.4* 3.6 3.4* 3.7 3.7  CL 101 104 106 106 100  CO2 22 23 25 25 23   GLUCOSE 147* 156* 131* 122* 116*  BUN 9 10 14 10 9   CREATININE 0.55* 0.75 0.88 0.74 0.64  CALCIUM 8.4* 9.2 8.7* 8.7* 8.7*  MG 1.9 1.8 1.9 1.9  --   PHOS 2.9 2.9 3.6 3.9  --     Studies: No results found.  Scheduled Meds:  bisacodyl  10 mg Oral QHS   desipramine  25 mg Oral QHS   enoxaparin (LOVENOX) injection  40 mg Subcutaneous Q24H   feeding supplement  237 mL Oral BID BM   lidocaine  1 patch Transdermal Q24H   polyethylene glycol  17 g Oral BID   tamsulosin  0.4 mg Oral QPC supper   thiamine  100 mg Oral Daily   Or   thiamine  100 mg Intravenous Daily   traZODone  50 mg Oral QHS   Continuous Infusions:   PRN Meds: acetaminophen, bisacodyl, HYDROmorphone  (DILAUDID) injection, morphine injection, ondansetron (ZOFRAN) IV  Time spent: 35 minutes  Author: Gillis Santa. MD [facility] 12/17/2022 1:42 PM  To reach On-call, see care teams to locate the attending and reach out to them via [email or web address]. If 7PM-7AM, please contact night-coverage If you still have difficulty reaching the attending provider, please page the [facility] (Director on Call) for [facility] on amion for assistance.

## 2022-12-17 NOTE — Plan of Care (Signed)
  Problem: Education: Goal: Knowledge of General Education information will improve Description: Including pain rating scale, medication(s)/side effects and non-pharmacologic comfort measures Outcome: Progressing   Problem: Health Behavior/Discharge Planning: Goal: Ability to manage health-related needs will improve Outcome: Progressing   Problem: Clinical Measurements: Goal: Ability to maintain clinical measurements within normal limits will improve Outcome: Progressing Goal: Will remain free from infection Outcome: Progressing Goal: Diagnostic test results will improve Outcome: Progressing Goal: Respiratory complications will improve Outcome: Progressing Goal: Cardiovascular complication will be avoided Outcome: Progressing   Problem: Coping: Goal: Level of anxiety will decrease Outcome: Progressing   Problem: Nutrition: Goal: Adequate nutrition will be maintained Outcome: Progressing   Problem: Elimination: Goal: Will not experience complications related to bowel motility Outcome: Progressing Goal: Will not experience complications related to urinary retention Outcome: Progressing   Problem: Pain Management: Goal: General experience of comfort will improve Outcome: Progressing   Problem: Safety: Goal: Ability to remain free from injury will improve Outcome: Progressing   Problem: Skin Integrity: Goal: Risk for impaired skin integrity will decrease Outcome: Progressing

## 2022-12-18 ENCOUNTER — Inpatient Hospital Stay: Payer: Medicare Other

## 2022-12-18 DIAGNOSIS — S22079A Unspecified fracture of T9-T10 vertebra, initial encounter for closed fracture: Secondary | ICD-10-CM | POA: Diagnosis not present

## 2022-12-18 LAB — CBC
HCT: 42.4 % (ref 39.0–52.0)
Hemoglobin: 14.6 g/dL (ref 13.0–17.0)
MCH: 32.4 pg (ref 26.0–34.0)
MCHC: 34.4 g/dL (ref 30.0–36.0)
MCV: 94.2 fL (ref 80.0–100.0)
Platelets: 238 10*3/uL (ref 150–400)
RBC: 4.5 MIL/uL (ref 4.22–5.81)
RDW: 13.1 % (ref 11.5–15.5)
WBC: 15 10*3/uL — ABNORMAL HIGH (ref 4.0–10.5)
nRBC: 0 % (ref 0.0–0.2)

## 2022-12-18 LAB — BASIC METABOLIC PANEL
Anion gap: 13 (ref 5–15)
BUN: 14 mg/dL (ref 8–23)
CO2: 23 mmol/L (ref 22–32)
Calcium: 9.2 mg/dL (ref 8.9–10.3)
Chloride: 100 mmol/L (ref 98–111)
Creatinine, Ser: 0.77 mg/dL (ref 0.61–1.24)
GFR, Estimated: >60 mL/min (ref >60)
Glucose, Bld: 168 mg/dL — ABNORMAL HIGH (ref 70–99)
Potassium: 3.8 mmol/L (ref 3.5–5.1)
Sodium: 136 mmol/L (ref 135–145)

## 2022-12-18 LAB — GLUCOSE, CAPILLARY: Glucose-Capillary: 102 mg/dL — ABNORMAL HIGH (ref 70–99)

## 2022-12-18 NOTE — Progress Notes (Addendum)
[facility] Progress Note  Patient: Zachary Fernandez    AOZ:[id number]  DOA: 12/10/2022     Date of Service: the patient was seen and examined on 12/18/2022  Chief Complaint  Patient presents with   Fall   Brief hospital course: Farshad Wolinski is a 82 y.o. male with a PMH significant for HTN, HLD, depression, BPH, chronic pain, alcohol dependence. He presented from home to the ED on 12/10/2022 with a fall and back pain. His spouse states that he drinks about half a bottle of hard liquor per day and he sneaks it at night so could be more. His last drink was the night prior to arrival.    Hospital course / significant events:  12/01: admitted to hospitalist service w/ thoracic vertebral fracture and CIWA protocol  12/02: active withdrawals. Started librium taper  12/03: still withdrawals but stable. Confirmed w/ neurosurgery recs against surgical intervention unless worsening    Consultants:  Spine/Neurosurgery  Assessment and Plan:  # Acute alcohol withdrawal Last drink was early morning/night of 12/1. S/p librium taper, continue ativan PRN SLP eval done started dysphagia 3 diet, discontinued IV fluids CIWA monitoring   # Thoracic vertebral fracture: As shown by MRI and CT scan. neurosurgery - Dr Marcell Barlow and Dr Katrinka Blazing recommending for non-surgical management, confirmed today w/ Dr Katrinka Blazing.  Continue TLSO brace when out of bed. Pain control: As needed morphine, Percocet, Tylenol, Lidoderm patch, As needed Robaxin 12/9 follow repeat T-spine x-ray ordered by neurosurgery Patient will need follow-up after 3 to 4 weeks  # Fever and leukocytosis, known source. Resolved CXR negative UA and urine culture was not sent last night by RN on 12/5 Lactic acid negative and procalcitonin negative S/p empiric antibiotics Zosyn for days, no signs of infection.  DC'd antibiotics on 12/7 Monitor off antibiotics  # Fall at home, initial encounter Continue fall precaution, PT/OT eval done,  recommended SNF placement.   analgesia PRN   # HTN (hypertension):  Patient was on bisoprolol-HCTZ 5-6.25 mg, which was help on admission Started Lopressor 12.5 mg p.o. twice daily Monitor BP and heart rate, titrate medication accordingly.    # Right rib fracture_7th and 8th rib Continue incentive spirometry Pain control as above   # HLD (hyperlipidemia): Crestor   # BPH (benign prostatic hyperplasia): Flomax # Constipation, resolved, continue laxatives # Hypokalemia, mild, potassium repleted.  Monitor electrolytes.   Body mass index is 26.11 kg/m.  Interventions:  Diet: Dysphagia 3 diet DVT Prophylaxis: Subcutaneous Lovenox   Advance goals of care discussion: Full code  Family Communication: family was present at bedside, at the time of interview.  The pt provided permission to discuss medical plan with the family. Opportunity was given to ask question and all questions were answered satisfactorily.   Disposition:  Pt is from Home, admitted with Fall and fracture of T9/10 spine, EtOH withdrawal, s/p Librium tapering dose, stable to dc now. Discharge to SNF.  Follow TOC, patient is stable to discharge when bed will be available.   Subjective: No significant events overnight, patient was sitting audibly on the recliner, stated no pain while sitting but it hurts when he was lying in the bed. Denied any other complaints.   Physical Exam: General: NAD, lying comfortably Appear in no distress, affect appropriate Eyes: PERRLA ENT: Oral Mucosa Clear, moist  Neck: no JVD,  Cardiovascular: S1 and S2 Present, no Murmur,  Respiratory: good respiratory effort, Bilateral Air entry equal and Decreased, no Crackles, no wheezes Abdomen: Bowel Sound present, Soft  and no tenderness,  Skin: no rashes Extremities: no Pedal edema, no calf tenderness Neurologic: without any new focal findings Gait not checked due to patient safety concerns  Vitals:   12/18/22 0516 12/18/22 0600  12/18/22 0909 12/18/22 1215  BP:   100/66 111/76  Pulse:   (!) 112 (!) 102  Resp: 19 18 16 16   Temp:   98.6 F (37 C) 98.2 F (36.8 C)  TempSrc:      SpO2: 95%  97% 94%  Weight:      Height:        Intake/Output Summary (Last 24 hours) at 12/18/2022 1547 Last data filed at 12/18/2022 1153 Gross per 24 hour  Intake 720 ml  Output 550 ml  Net 170 ml   Filed Weights   12/10/22 0800  Weight: 69 kg    Data Reviewed: I have personally reviewed and interpreted daily labs, tele strips, imagings as discussed above. I reviewed all nursing notes, pharmacy notes, vitals, pertinent old records I have discussed plan of care as described above with RN and patient/family.  CBC: Recent Labs  Lab 12/14/22 0534 12/15/22 0607 12/16/22 0543 12/17/22 0643 12/18/22 1039  WBC 14.6* 10.4 7.4 9.5 15.0*  HGB 16.8 14.4 13.6 14.9 14.6  HCT 48.9 41.9 39.8 43.8 42.4  MCV 93.7 95.4 94.8 94.4 94.2  PLT 176 150 157 200 238   Basic Metabolic Panel: Recent Labs  Lab 12/13/22 0444 12/14/22 0534 12/15/22 0607 12/16/22 0543 12/17/22 0643 12/18/22 1039  NA 134* 137 141 139 134* 136  K 3.4* 3.6 3.4* 3.7 3.7 3.8  CL 101 104 106 106 100 100  CO2 22 23 25 25 23 23   GLUCOSE 147* 156* 131* 122* 116* 168*  BUN 9 10 14 10 9 14   CREATININE 0.55* 0.75 0.88 0.74 0.64 0.77  CALCIUM 8.4* 9.2 8.7* 8.7* 8.7* 9.2  MG 1.9 1.8 1.9 1.9  --   --   PHOS 2.9 2.9 3.6 3.9  --   --     Studies: No results found.  Scheduled Meds:  bisacodyl  10 mg Oral QHS   desipramine  25 mg Oral QHS   enoxaparin (LOVENOX) injection  40 mg Subcutaneous Q24H   feeding supplement  237 mL Oral BID BM   lidocaine  1 patch Transdermal Q24H   metoprolol tartrate  12.5 mg Oral BID   polyethylene glycol  17 g Oral BID   tamsulosin  0.4 mg Oral QPC supper   thiamine  100 mg Oral Daily   Or   thiamine  100 mg Intravenous Daily   traZODone  50 mg Oral QHS   Continuous Infusions:   PRN Meds: acetaminophen, bisacodyl,  HYDROmorphone (DILAUDID) injection, morphine injection, ondansetron (ZOFRAN) IV  Time spent: 35 minutes  Author: Gillis Santa. MD [facility] 12/18/2022 3:47 PM  To reach On-call, see care teams to locate the attending and reach out to them via [email or web address]. If 7PM-7AM, please contact night-coverage If you still have difficulty reaching the attending provider, please page the [facility] (Director on Call) for [facility] on amion for assistance.

## 2022-12-18 NOTE — Progress Notes (Signed)
Speech Language Pathology Treatment: Dysphagia  Patient Details Name: Zachary Fernandez MRN: [id number] DOB: [birth year] Today's Date: 12/18/2022 Time: 0347-4259 SLP Time Calculation (min) (ACUTE ONLY): 13 min  Assessment / Plan / Recommendation Clinical Impression  Pt seen at bedside for assessment of tolerance of diet to dys3/thin liquid. Pt's wife present today and reports pt had some [employer] and chicken and potato salad today and tolerated it well. No overt s/s aspiration observed or reported. SLP provided education regarding advanced diet to Dys3 primarily for energy conservation. Anticipate advance to regular textures at next venue of care with continued improvement in strength and endurance. Recommend follow up with speech therapy at next venue to determine readiness to advance textures further. Acute ST goals met - signing off.    HPI HPI: Pt is a 82 y.o. male with a PMH significant for Dementia (per Daughter), HTN, HLD, depression, BPH, chronic pain, Alcohol Dependence and Use daily, and recent Fall.  He presented from home to the ED on 12/10/2022 with a fall and back pain. His spouse states that he drinks about half a bottle of hard liquor per day and he sneaks it at night so could be more. His last drink was the night prior to arrival.   CT of Spine - T:  1. Superior endplate fracture at T10 without significant  compression.  2. Intramuscular hematoma in the left paraspinous musculature at the  T10 level.  3. 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   CT of Chest: Trace right pleural effusion with mild overlying atelectasis,  improving compared to the prior CT.  4. Dependent subsegmental atelectasis or consolidation within the  left lung base.  5. Aortic and coronary artery atherosclerosis      SLP Plan  Discharge SLP treatment due to goals met      Recommendations for follow up therapy are one component of a multi-disciplinary discharge  planning process, led by the attending physician.  Recommendations may be updated based on patient status, additional functional criteria and insurance authorization.    Recommendations  Diet recommendations: Dysphagia 3 (mechanical soft);Thin liquid (extra gravy/sauce for moisture) Liquids provided via: Cup;Straw Medication Administration: Whole meds with puree Supervision: Staff to assist with self feeding;Full supervision/cueing for compensatory strategies;Patient able to self feed Compensations: Minimize environmental distractions;Slow rate;Small sips/bites;Lingual sweep for clearance of pocketing;Multiple dry swallows after each bite/sip;Follow solids with liquid Postural Changes and/or Swallow Maneuvers: Out of bed for meals;Seated upright 90 degrees;Upright 30-60 min after meal                  Oral care BID;Staff/trained caregiver to provide oral care   Frequent or constant Supervision/Assistance Dysphagia, oropharyngeal phase (R13.12)     Discharge SLP treatment due to (comment)    Melanee Cordial B. Murvin Natal, [facility], CCC-SLP Speech Language Pathologist  Leigh Aurora 12/18/2022, 2:34 PM

## 2022-12-18 NOTE — Plan of Care (Signed)

## 2022-12-19 ENCOUNTER — Telehealth: Payer: Self-pay | Admitting: Neurosurgery

## 2022-12-19 DIAGNOSIS — S22079A Unspecified fracture of T9-T10 vertebra, initial encounter for closed fracture: Secondary | ICD-10-CM | POA: Diagnosis not present

## 2022-12-19 LAB — CBC
HCT: 41.6 % (ref 39.0–52.0)
Hemoglobin: 14 g/dL (ref 13.0–17.0)
MCH: 32.1 pg (ref 26.0–34.0)
MCHC: 33.7 g/dL (ref 30.0–36.0)
MCV: 95.4 fL (ref 80.0–100.0)
Platelets: 229 10*3/uL (ref 150–400)
RBC: 4.36 MIL/uL (ref 4.22–5.81)
RDW: 13.1 % (ref 11.5–15.5)
WBC: 9.3 10*3/uL (ref 4.0–10.5)
nRBC: 0 % (ref 0.0–0.2)

## 2022-12-19 LAB — CULTURE, BLOOD (ROUTINE X 2)
Culture: NO GROWTH
Culture: NO GROWTH
Special Requests: ADEQUATE
Special Requests: ADEQUATE

## 2022-12-19 NOTE — TOC Progression Note (Signed)
Transition of Care [facility]) - Progression Note    Patient Details  Name: Zachary Fernandez MRN: [id number] Date of Birth: 11/17/1940  [facility] CM/SW Contact  Truddie Hidden, RN Phone Number: 12/19/2022, 11:34 AM  Clinical Narrative:    Spoke with patients's daughter, Sue Lush to give bed offer for [facility]. She inquired about waiting until patient gets a bed offer for [facility] or [facility] which are her preferences. Sue Lush was advised of CMS guideline for patient to discharge to first available bed at time of discharge. She was also advised of right to appeal. Sue Lush is not accepting of bed offer.         Expected Discharge Plan and Services                                               Social Determinants of Health (SDOH) Interventions SDOH Screenings   Food Insecurity: No Food Insecurity (12/12/2022)  Housing: Low Risk  (12/12/2022)  Transportation Needs: No Transportation Needs (12/12/2022)  Utilities: Not At Risk (12/12/2022)  Depression (PHQ2-9): Low Risk  (08/31/2022)  Financial Resource Strain: Patient Declined (11/07/2022)   Received from [facility]  Tobacco Use: Low Risk  (12/10/2022)  Recent Concern: Tobacco Use - Medium Risk (11/07/2022)   Received from [facility]    Readmission Risk Interventions     No data to display

## 2022-12-19 NOTE — Progress Notes (Signed)
Physical Therapy Treatment Patient Details Name: Latrel Luongo MRN: [id number] DOB: July 01, 1940 Today's Date: 12/19/2022   History of Present Illness 82 y.o. male with a PMH significant for HTN, HLD, depression, BPH, chronic pain, alcohol dependence. He presented from home to the ED on 12/10/2022 with a fall and back pain, found to have T10 fracture. His spouse states that he drinks about half a bottle of hard liquor per day and he sneaks it at night so could be more.    PT Comments  Patient walking without staff on arrival to the room. Patient assisted safely to the bathroom where TLSO was donned with assistance from therapist. Patient continues to require assistance for transfers and ambulation. Safety cues provided with all mobility for safety. Education provided with patient and daughter on donning TLSO. PT will continue to follow to maximize independence. Patient will need rehabilitation < 3 hours/day after this hospital stay.    If plan is discharge home, recommend the following: A lot of help with walking and/or transfers;A lot of help with bathing/dressing/bathroom;Assistance with cooking/housework;Direct supervision/assist for medications management;Direct supervision/assist for financial management;Assist for transportation;Help with stairs or ramp for entrance;Supervision due to cognitive status   Can travel by private vehicle     No  Equipment Recommendations  Rolling walker (2 wheels)    Recommendations for Other Services       Precautions / Restrictions Precautions Precautions: Back Precaution Booklet Issued: No Required Braces or Orthoses: Spinal Brace Spinal Brace: Thoracolumbosacral orthotic Restrictions Weight Bearing Restrictions: No     Mobility  Bed Mobility               General bed mobility comments: not assessed as patient sitting up on arrival and post session    Transfers Overall transfer level: Needs assistance Equipment used: Rolling walker (2  wheels) Transfers: Sit to/from Stand Sit to Stand: Min assist           General transfer comment: difficulty with sequencing with standing with verbal cues required for technique. lifting assistance required for standing from toilet    Ambulation/Gait Ambulation/Gait assistance: Min assist Gait Distance (Feet): 45 Feet Assistive device: Rolling walker (2 wheels) Gait Pattern/deviations: Decreased stride length Gait velocity: decreased     General Gait Details: patient ambulating in the room on arrival to room, incontinent of bowel. patient was assisted to the toilet and TLSO donned with maximal assistance from therapist prior to continued walking. steadying assistance provided for safety with ambulation   Stairs             Wheelchair Mobility     Tilt Bed    Modified Rankin (Stroke Patients Only)       Balance Overall balance assessment: History of Falls Sitting-balance support: Bilateral upper extremity supported Sitting balance-Leahy Scale: Fair     Standing balance support: Bilateral upper extremity supported, During functional activity, Reliant on assistive device for balance Standing balance-Leahy Scale: Poor Standing balance comment: external support required                            Cognition Arousal: Alert Behavior During Therapy: Impulsive Overall Cognitive Status: Impaired/Different from baseline                                 General Comments: patient was standing and walking in the room by himself without the brace on arrival to the room,  chair alarm going off. decreased safety awareness and some confusion about recent events        Exercises      General Comments General comments (skin integrity, edema, etc.): education provided on importance on waiting for staff prior to mobilizing, importance of wearing TLSO with mobility. daughter educated on donning TLSO (they had requested for [employer] to come by also and  are anticipated to come today between 12-1)      Pertinent Vitals/Pain Pain Assessment Pain Assessment: No/denies pain    Home Living                          Prior Function            PT Goals (current goals can now be found in the care plan section) Acute Rehab PT Goals Patient Stated Goal: to learn about the brace PT Goal Formulation: With family Time For Goal Achievement: 12/26/22 Potential to Achieve Goals: Fair Progress towards PT goals: Progressing toward goals    Frequency    Min 1X/week      PT Plan      Co-evaluation              AM-PAC PT "6 Clicks" Mobility   Outcome Measure  Help needed turning from your back to your side while in a flat bed without using bedrails?: A Little Help needed moving from lying on your back to sitting on the side of a flat bed without using bedrails?: A Lot Help needed moving to and from a bed to a chair (including a wheelchair)?: A Lot Help needed standing up from a chair using your arms (e.g., wheelchair or bedside chair)?: A Lot Help needed to walk in hospital room?: A Little Help needed climbing 3-5 steps with a railing? : A Lot 6 Click Score: 14    End of Session Equipment Utilized During Treatment: Back brace (TLSO) Activity Tolerance: Patient limited by fatigue Patient left: in chair;with call bell/phone within reach;with chair alarm set Nurse Communication: Mobility status PT Visit Diagnosis: Muscle weakness (generalized) (M62.81);Difficulty in walking, not elsewhere classified (R26.2)     Time: 9629-5284 PT Time Calculation (min) (ACUTE ONLY): 41 min  Charges:    $Therapeutic Activity: 38-52 mins PT General Charges $$ ACUTE PT VISIT: 1 Visit                     Donna Bernard, PT, MPT    Ina Homes 12/19/2022, 1:05 PM

## 2022-12-19 NOTE — Progress Notes (Signed)
Thoracolumbar xrays reviewed by myself and Dr. Myer Haff. There are no signs of significant changes to his alignment.  They appear largely stable.  Continue with conservative treatment and TLSO brace.  We have moved his outpatient follow-up to 2 weeks from now given the current plan is to discharge him to SNF in the coming days.  Manning Charity PA-C

## 2022-12-19 NOTE — Telephone Encounter (Signed)
  Media Information  Document Information  I called Zachary Fernandez back this morning. She says that the brace people have already been out to see him this morning. He has been wearing the brace wrong the whole time. He had xrays yesterday can someone call her with the results.

## 2022-12-19 NOTE — Telephone Encounter (Signed)
PER Manning Charity 12/18/2022 I will go ahead and cancel his 2 appointments with me. If he is still in patient on 12/11 then we can reschedule with next available APP once he is d/ced.

## 2022-12-19 NOTE — Telephone Encounter (Signed)
I spoke to patient's wife and informed her to reach out to [employer] to have them come and teach them how to put the brace on. She states they will come to his room today around 11:30.

## 2022-12-19 NOTE — Progress Notes (Signed)
[facility] Progress Note  Patient: Zachary Fernandez    WUJ:[id number]  DOA: 12/10/2022     Date of Service: the patient was seen and examined on 12/19/2022  Chief Complaint  Patient presents with   Fall   Brief hospital course: Zachary Fernandez is a 82 y.o. male with a PMH significant for HTN, HLD, depression, BPH, chronic pain, alcohol dependence. He presented from home to the ED on 12/10/2022 with a fall and back pain. His spouse states that he drinks about half a bottle of hard liquor per day and he sneaks it at night so could be more. His last drink was the night prior to arrival.    Hospital course / significant events:  12/01: admitted to hospitalist service w/ thoracic vertebral fracture and CIWA protocol  12/02: active withdrawals. Started librium taper  12/03: still withdrawals but stable. Confirmed w/ neurosurgery recs against surgical intervention unless worsening    Consultants:  Spine/Neurosurgery  Assessment and Plan:  # Acute alcohol withdrawal Last drink was early morning/night of 12/1. S/p librium taper, continue ativan PRN SLP eval done started dysphagia 3 diet, discontinued IV fluids CIWA monitoring   # Thoracic vertebral fracture: As shown by MRI and CT scan. neurosurgery - Dr Marcell Barlow and Dr Katrinka Blazing recommending for non-surgical management, confirmed today w/ Dr Katrinka Blazing.  Continue TLSO brace when out of bed. Pain control: As needed morphine, Percocet, Tylenol, Lidoderm patch, As needed Robaxin 12/9 x-ray T-spine stable, reviewed by spine surgery.  Recommended no intervention and follow-up as an outpatient in 2 weeks.   # Fever and leukocytosis, known source. Resolved CXR negative UA and urine culture was not sent last night by RN on 12/5 Lactic acid negative and procalcitonin negative S/p empiric antibiotics Zosyn for days, no signs of infection.  DC'd antibiotics on 12/7 Monitor off antibiotics  # Fall at home, initial encounter Continue fall precaution,  PT/OT eval done, recommended SNF placement.   analgesia PRN   # HTN (hypertension):  Patient was on bisoprolol-HCTZ 5-6.25 mg, which was help on admission Started Lopressor 12.5 mg p.o. twice daily Monitor BP and heart rate, titrate medication accordingly.    # Right rib fracture_7th and 8th rib Continue incentive spirometry Pain control as above   # HLD (hyperlipidemia): Crestor   # BPH (benign prostatic hyperplasia): Flomax # Constipation, resolved, continue laxatives # Hypokalemia, mild, potassium repleted.  Monitor electrolytes.   Body mass index is 26.11 kg/m.  Interventions:  Diet: Dysphagia 3 diet DVT Prophylaxis: Subcutaneous Lovenox   Advance goals of care discussion: Full code  Family Communication: family was present at bedside, at the time of interview.  The pt provided permission to discuss medical plan with the family. Opportunity was given to ask question and all questions were answered satisfactorily.   Disposition:  Pt is from Home, admitted with Fall and fracture of T9/10 spine, EtOH withdrawal, s/p Librium tapering dose, stable to dc now. Discharge to SNF.  Follow TOC, patient is stable to discharge when bed will be available.   Subjective: No significant events overnight, patient with complaining of back pain 5-6/10 while sitting in the recliner, stated that it increases in the bed especially when he rolls over.  No any other complaints.  Patient is awaiting for SNF placement.   Physical Exam: General: NAD, lying comfortably Appear in no distress, affect appropriate Eyes: PERRLA ENT: Oral Mucosa Clear, moist  Neck: no JVD,  Cardiovascular: S1 and S2 Present, no Murmur,  Respiratory: good respiratory effort, Bilateral  Air entry equal and Decreased, no Crackles, no wheezes Abdomen: Bowel Sound present, Soft and no tenderness,  Skin: no rashes Extremities: no Pedal edema, no calf tenderness Neurologic: without any new focal findings Gait not  checked due to patient safety concerns  Vitals:   12/18/22 2320 12/19/22 0345 12/19/22 0938 12/19/22 1353  BP: 101/64 (!) 144/85 94/75 118/70  Pulse: 91 (!) 105 (!) 120 (!) 105  Resp: 18 16 17 17   Temp: 98 F (36.7 C) (!) 97.5 F (36.4 C) 98.2 F (36.8 C) 98.2 F (36.8 C)  TempSrc: Oral Oral    SpO2: 96% 95% 97% 98%  Weight:      Height:        Intake/Output Summary (Last 24 hours) at 12/19/2022 1629 Last data filed at 12/19/2022 0702 Gross per 24 hour  Intake --  Output 350 ml  Net -350 ml   Filed Weights   12/10/22 0800  Weight: 69 kg    Data Reviewed: I have personally reviewed and interpreted daily labs, tele strips, imagings as discussed above. I reviewed all nursing notes, pharmacy notes, vitals, pertinent old records I have discussed plan of care as described above with RN and patient/family.  CBC: Recent Labs  Lab 12/15/22 0607 12/16/22 0543 12/17/22 0643 12/18/22 1039 12/19/22 0427  WBC 10.4 7.4 9.5 15.0* 9.3  HGB 14.4 13.6 14.9 14.6 14.0  HCT 41.9 39.8 43.8 42.4 41.6  MCV 95.4 94.8 94.4 94.2 95.4  PLT 150 157 200 238 229   Basic Metabolic Panel: Recent Labs  Lab 12/13/22 0444 12/14/22 0534 12/15/22 0607 12/16/22 0543 12/17/22 0643 12/18/22 1039  NA 134* 137 141 139 134* 136  K 3.4* 3.6 3.4* 3.7 3.7 3.8  CL 101 104 106 106 100 100  CO2 22 23 25 25 23 23   GLUCOSE 147* 156* 131* 122* 116* 168*  BUN 9 10 14 10 9 14   CREATININE 0.55* 0.75 0.88 0.74 0.64 0.77  CALCIUM 8.4* 9.2 8.7* 8.7* 8.7* 9.2  MG 1.9 1.8 1.9 1.9  --   --   PHOS 2.9 2.9 3.6 3.9  --   --     Studies: No results found.  Scheduled Meds:  bisacodyl  10 mg Oral QHS   desipramine  25 mg Oral QHS   enoxaparin (LOVENOX) injection  40 mg Subcutaneous Q24H   feeding supplement  237 mL Oral BID BM   lidocaine  1 patch Transdermal Q24H   metoprolol tartrate  12.5 mg Oral BID   polyethylene glycol  17 g Oral BID   tamsulosin  0.4 mg Oral QPC supper   thiamine  100 mg Oral  Daily   Or   thiamine  100 mg Intravenous Daily   traZODone  50 mg Oral QHS   Continuous Infusions:   PRN Meds: acetaminophen, bisacodyl, HYDROmorphone (DILAUDID) injection, morphine injection, ondansetron (ZOFRAN) IV  Time spent: 35 minutes  Author: Gillis Santa. MD [facility] 12/19/2022 4:29 PM  To reach On-call, see care teams to locate the attending and reach out to them via [email or web address]. If 7PM-7AM, please contact night-coverage If you still have difficulty reaching the attending provider, please page the [facility] (Director on Call) for [facility] on amion for assistance.

## 2022-12-20 ENCOUNTER — Ambulatory Visit: Payer: TRICARE For Life (TFL) | Admitting: Physician Assistant

## 2022-12-20 DIAGNOSIS — S22079A Unspecified fracture of T9-T10 vertebra, initial encounter for closed fracture: Secondary | ICD-10-CM | POA: Diagnosis not present

## 2022-12-20 MED ORDER — OXYCODONE HCL 5 MG PO TABS: 5.0000 mg | ORAL_TABLET | Freq: Four times a day (QID) | ORAL | 0 refills | Status: DC | PRN

## 2022-12-20 MED ORDER — POLYETHYLENE GLYCOL 3350 17 G PO PACK: 17.0000 g | PACK | Freq: Two times a day (BID) | ORAL | Status: AC

## 2022-12-20 MED ORDER — ACETAMINOPHEN 325 MG PO TABS: 650.0000 mg | ORAL_TABLET | Freq: Four times a day (QID) | ORAL | Status: DC | PRN

## 2022-12-20 MED ORDER — BISACODYL 5 MG PO TBEC: 10.0000 mg | DELAYED_RELEASE_TABLET | Freq: Every day | ORAL | Status: DC

## 2022-12-20 MED ORDER — OXYCODONE HCL 5 MG PO TABS: 5.0000 mg | ORAL_TABLET | Freq: Four times a day (QID) | ORAL | Status: DC | PRN

## 2022-12-20 MED ORDER — ACETAMINOPHEN 325 MG PO TABS: 650.0000 mg | ORAL_TABLET | Freq: Four times a day (QID) | ORAL | Status: AC | PRN

## 2022-12-20 MED ORDER — METOPROLOL TARTRATE 25 MG PO TABS: 12.5000 mg | ORAL_TABLET | Freq: Two times a day (BID) | ORAL | Status: AC

## 2022-12-20 MED ORDER — TRAZODONE HCL 50 MG PO TABS: 50.0000 mg | ORAL_TABLET | Freq: Every evening | ORAL | Status: AC | PRN

## 2022-12-20 NOTE — TOC Transition Note (Signed)
Transition of Care [facility]) - CM/SW Discharge Note   Patient Details  Name: Zachary Fernandez MRN: [id number] Date of Birth: [birth year]  Transition of Care [facility]) CM/SW Contact:  Truddie Hidden, RN Phone Number: 12/20/2022, 1:42 PM   Clinical Narrative:    Retrieved a message from patient's daughter stating  a bed was offered at [facility].  Spoke with [place] from [facility] to confirm bed offer. Bed offer confirmed.    Per facility patient admission confirmed for today. Nurse will call report to [phone] Face sheet and medical necessity forms printed to the floor to be added to the EMS pack EMS arranged "He's number two." Discharge summary and SNF transfer report sent in HUB.  Nurse, and family notified spoke with Sue Lush TOC signing off.         Patient Goals and CMS Choice      Discharge Placement                         Discharge Plan and Services Additional resources added to the After Visit Summary for                                       Social Determinants of Health (SDOH) Interventions SDOH Screenings   Food Insecurity: No Food Insecurity (12/12/2022)  Housing: Low Risk  (12/12/2022)  Transportation Needs: No Transportation Needs (12/12/2022)  Utilities: Not At Risk (12/12/2022)  Depression (PHQ2-9): Low Risk  (08/31/2022)  Financial Resource Strain: Patient Declined (11/07/2022)   Received from [facility]  Tobacco Use: Low Risk  (12/10/2022)  Recent Concern: Tobacco Use - Medium Risk (11/07/2022)   Received from [facility]     Readmission Risk Interventions     No data to display

## 2022-12-20 NOTE — Discharge Summary (Signed)
[facility] Discharge Summary   Patient: Zachary Fernandez LOV:[id number]  PCP: Mick Sell, MD  Date of admission: 12/10/2022   Date of discharge:  12/20/2022     Discharge Diagnoses:  Principal Problem:   Thoracic vertebral fracture [facility]) Active Problems:   Fall at home, initial encounter   HTN (hypertension)   Right rib fracture_7th and 8th rib   HLD (hyperlipidemia)   BPH (benign prostatic hyperplasia)   Alcohol abuse   Alcohol withdrawal (HCC)   Acute alcohol abuse, with delirium (HCC)   Fall   Admitted From: Home Disposition:  SNF   Recommendations for Outpatient Follow-up:  PCP: in 1 wk F/u neurosurgery in 2 weeks Follow up LABS/TEST:  None   Contact information for follow-up providers     Joan Flores, PA-C Follow up on 01/01/2023.   Specialty: Physician Assistant Contact information: [address] [place], [address] [place] [address] [phone]              Contact information for after-discharge care     Destination     [facility] Preferred SNF .   Service: Skilled Nursing Contact information: [address] [place] [address] [phone]                    Diet recommendation: Cardiac diet  Activity: The patient is advised to gradually reintroduce usual activities, as tolerated  Discharge Condition: stable  Code Status: Full code   History of present illness: As per the H and P dictated on admission Hospital Course:  Zachary Fernandez is a 82 y.o. male with a PMH significant for HTN, HLD, depression, BPH, chronic pain, alcohol dependence. He presented from home to the ED on 12/10/2022 with a fall and back pain. His spouse states that he drinks about half a bottle of hard liquor per day and he sneaks it at night so could be more. His last drink was the night prior to arrival.    Hospital course / significant events:  12/01: admitted to hospitalist service w/ thoracic  vertebral fracture and CIWA protocol  12/02: active withdrawals. Started librium taper  12/03: still withdrawals but stable. Confirmed w/ neurosurgery recs against surgical intervention unless worsening    Consultants:  Spine/Neurosurgery   Assessment and Plan:   # Acute alcohol withdrawal. Last drink was early morning/night of 12/1. S/p librium taper, and ativan PRN.  Monitored as per CIWA protocol, now out of window. SLP eval done started dysphagia 3 diet, discontinued IV fluids # Thoracic vertebral fracture: As shown by MRI and CT scan. neurosurgery - Dr Marcell Barlow and Dr Katrinka Blazing recommending for non-surgical management, confirmed today w/ Dr Katrinka Blazing.  Continue TLSO brace when out of bed. Pain control: s/p morphine IV, continue Tylenol as needed and oxycodone as needed. May continue Lidoderm patch prn and prn Robaxin 12/9 x-ray T-spine stable, reviewed by spine surgery.  Recommended no intervention and follow-up as an outpatient in 2 weeks. # Fever and leukocytosis, known source. Resolved CXR negative. UA and urine culture was not sent last night by RN on 12/5 Lactic acid negative and procalcitonin negative. S/p empiric antibiotics Zosyn for days, no signs of infection.  DC'd antibiotics on 12/7 # Fall at home, initial encounter. Continue fall precaution, PT/OT eval done, recommended SNF placement.  analgesia PRN # HTN (hypertension): Patient was on bisoprolol-HCTZ 5-6.25 mg, which were held on admission and discontinued on discharge.  Started Lopressor 12.5 mg p.o. twice daily. Monitor BP  and heart rate, titrate medication accordingly. # Right rib fracture_7th and 8th rib: Continue incentive spirometry Pain control as above # HLD (hyperlipidemia): Crestor # BPH (benign prostatic hyperplasia): Flomax # Constipation, resolved, continue laxatives # Hypokalemia, mild, potassium repleted. Resolved   Body mass index is 26.11 kg/m.  Nutrition Interventions:  Pain control  - [place]  Controlled Substance Reporting System database could not be reviewed because website was not working.. - 10 tablets prescribed as per SNF requirement. - Patient was instructed, not to drive, operate heavy machinery, perform activities at heights, swimming or participation in water activities or provide baby sitting services while on Pain, Sleep and Anxiety Medications; until his outpatient Physician has advised to do so again.  - Also recommended to not to take more than prescribed Pain, Sleep and Anxiety Medications.  Patient was seen by physical therapy, who recommended Therapy, SNF placement, which was arranged. On the day of the discharge the patient's vitals were stable, and no other acute medical condition were reported by patient. the patient was felt safe to be discharge at [facility].  Consultants: Neurosurgery Procedures: None  Discharge Exam: General: Appear in no distress, no Rash; Oral Mucosa Clear, moist. Cardiovascular: S1 and S2 Present, no Murmur, Respiratory: normal respiratory effort, Bilateral Air entry present and no Crackles, no wheezes Abdomen: Bowel Sound present, Soft and no tenderness, no hernia Extremities: no Pedal edema, no calf tenderness Neurology: alert and oriented to time, place, and person affect appropriate.  Filed Weights   12/10/22 0800  Weight: 69 kg   Vitals:   12/20/22 0332 12/20/22 0700  BP: 125/83 120/74  Pulse: 100 98  Resp: 17   Temp: 97.6 F (36.4 C) 98.1 F (36.7 C)  SpO2: 100% 100%    DISCHARGE MEDICATION: Allergies as of 12/20/2022   No Known Allergies      Medication List     STOP taking these medications    amLODipine 5 MG tablet Commonly known as: NORVASC   bisoprolol-hydrochlorothiazide 5-6.25 MG tablet Commonly known as: ZIAC   folic acid 1 MG tablet Commonly known as: FOLVITE   meloxicam 15 MG tablet Commonly known as: MOBIC   predniSONE 10 MG tablet Commonly known as: DELTASONE   Testosterone 10 MG/ACT (2%)  Gel   traMADol 50 MG tablet Commonly known as: ULTRAM       TAKE these medications    acetaminophen 325 MG tablet Commonly known as: TYLENOL Take 2 tablets (650 mg total) by mouth every 6 (six) hours as needed for mild pain (pain score 1-3), fever or headache.   BERBERINE COMPLEX PO Take 1 Dose by mouth daily.   bisacodyl 5 MG EC tablet Commonly known as: DULCOLAX Take 2 tablets (10 mg total) by mouth at bedtime.   CENTRUM SILVER PO Take 1 tablet by mouth daily.   CITRACAL PO Take 650 mg by mouth. 2 tabs in am   cyclobenzaprine 5 MG tablet Commonly known as: FLEXERIL Take 5 mg by mouth 3 (three) times daily as needed for muscle spasms.   desipramine 25 MG tablet Commonly known as: NORPRAMIN Take 25 mg by mouth at bedtime.   lidocaine 5 % Commonly known as: Lidoderm Place 1 patch onto the skin every 12 (twelve) hours. Remove & Discard patch within 12 hours or as directed by MD   metoprolol tartrate 25 MG tablet Commonly known as: LOPRESSOR Take 0.5 tablets (12.5 mg total) by mouth 2 (two) times daily.   oxyCODONE 5 MG immediate release tablet  Commonly known as: Oxy IR/ROXICODONE Take 1 tablet (5 mg total) by mouth every 6 (six) hours as needed for severe pain (pain score 7-10) or moderate pain (pain score 4-6).   polyethylene glycol 17 g packet Commonly known as: MIRALAX / GLYCOLAX Take 17 g by mouth 2 (two) times daily.   rosuvastatin 20 MG tablet Commonly known as: CRESTOR Take 20 mg by mouth daily.   tamsulosin 0.4 MG Caps capsule Commonly known as: FLOMAX Take 0.4 mg by mouth daily.   thiamine 100 MG tablet Commonly known as: Vitamin B-1 Take 1 tablet (100 mg total) by mouth daily.   traZODone 50 MG tablet Commonly known as: DESYREL Take 1 tablet (50 mg total) by mouth at bedtime as needed for sleep.   TURMERIC PO Take 1 tablet by mouth daily.   vitamin B-12 500 MCG tablet Commonly known as: CYANOCOBALAMIN Take 500 mcg by mouth daily.        No Known Allergies Discharge Instructions     Diet - low sodium heart healthy   Complete by: As directed    Discharge instructions   Complete by: As directed    Follow with PCP, patient should be seen by an MD in 1 to 2 days, continue monitor BP and titrate medications accordingly. Follow-up with neurosurgery/spine surgery in 2 weeks   Increase activity slowly   Complete by: As directed        The results of significant diagnostics from this hospitalization (including imaging, microbiology, ancillary and laboratory) are listed below for reference.    Significant Diagnostic Studies: DG THORACOLUMBAR SPINE  Result Date: 12/18/2022 CLINICAL DATA:  T10 fracture. EXAM: THORACOLUMBAR SPINE 1V COMPARISON:  Thoracic spine MRI dated 12/10/2022 and CT dated 12/10/2022. FINDINGS: Evaluation is limited due to overlying hardware and osteopenia. The fracture seen on the CT and MRI are poorly visualized on this radiograph. The bones are osteopenic. Multilevel degenerative changes. IMPRESSION: The fracture seen on the CT and MRI are poorly visualized on this radiograph. Electronically Signed   By: Elgie Collard M.D.   On: 12/18/2022 19:03   DG Chest Port 1 View  Result Date: 12/14/2022 CLINICAL DATA:  Fever. EXAM: PORTABLE CHEST 1 VIEW COMPARISON:  October 21, 2022. FINDINGS: The heart size and mediastinal contours are within normal limits. Both lungs are clear. Old right rib fractures are noted. IMPRESSION: No active disease. Electronically Signed   By: Lupita Raider M.D.   On: 12/14/2022 09:51   MR THORACIC SPINE WO CONTRAST  Result Date: 12/10/2022 CLINICAL DATA:  Back trauma.  Fall.  Fracture identified on CT. EXAM: MRI THORACIC SPINE WITHOUT CONTRAST TECHNIQUE: Multiplanar, multisequence MR imaging of the thoracic spine was performed. No intravenous contrast was administered. COMPARISON:  CT of the thoracic spine 12/10/2022 FINDINGS: Alignment: No significant listhesis is present. Thoracic  kyphosis is preserved. Vertebrae: The fracture subjacent to the superior endplate of T10 is again seen. Edema is present in the upper third of the T10 vertebral body. Focal edema is also present along the inferior endplate of T9 with a nondisplaced fracture. No retropulsed bone is present. Marrow signal and vertebral body heights are otherwise normal. Cord:  Normal signal and morphology. Paraspinal and other soft tissues: Soft tissue swelling and hemorrhage is present at the T9-10 level. The paraspinous soft tissues are otherwise within normal limits. A small right pleural effusion is present. The visualized upper abdomen is unremarkable. Disc levels: Mild foraminal narrowing is present bilaterally at T9-10. Stenosis was exaggerated by  CT. No other significant stenosis is present in the thoracic spine. IMPRESSION: 1. Acute fracture subjacent to the superior endplate of T10 with edema in the upper third of the T10 vertebral body. 2. Acute nondisplaced fracture along the inferior endplate of T9 with edema in the inferior endplate of T9. 3. Soft tissue swelling and hemorrhage at the T9-10 level. 4. Mild foraminal narrowing bilaterally at T9-10. Stenosis was exaggerated by CT. 5. Small right pleural effusion. Electronically Signed   By: Marin Roberts M.D.   On: 12/10/2022 17:14   DG Thoracic Spine 2 View  Result Date: 12/10/2022 CLINICAL DATA:  Back pain, fall EXAM: THORACIC SPINE 2 VIEWS COMPARISON:  Same day thoracic spine CT FINDINGS: Diffuse ankylosis of the thoracic spine. Subtle nondisplaced fracture at the superior endplate of the T10 vertebral body, better seen by same day CT. No change in alignment. No traumatic listhesis. No new fractures. IMPRESSION: Subtle nondisplaced fracture at the superior endplate of the T10 vertebral body, better seen by same day CT. No change in alignment. Electronically Signed   By: Duanne Guess D.O.   On: 12/10/2022 14:58   CT CHEST WO CONTRAST  Result Date:  12/10/2022 CLINICAL DATA:  Chest trauma, blunt EXAM: CT CHEST WITHOUT CONTRAST TECHNIQUE: Multidetector CT imaging of the chest was performed following the standard protocol without IV contrast. RADIATION DOSE REDUCTION: This exam was performed according to the departmental dose-optimization program which includes automated exposure control, adjustment of the mA and/or kV according to patient size and/or use of iterative reconstruction technique. COMPARISON:  10/27/2022 FINDINGS: Cardiovascular: Heart size within normal limits. No pericardial effusion. Thoracic aorta is nonaneurysmal. Scattered atherosclerotic vascular calcifications of the aorta and coronary arteries. Central pulmonary vasculature within normal limits. Mediastinum/Nodes: No enlarged mediastinal or axillary lymph nodes. Thyroid gland, trachea, and esophagus demonstrate no significant findings. Lungs/Pleura: Trace right pleural effusion with mild overlying atelectasis, improving compared to the prior CT. Dependent subsegmental atelectasis or consolidation within the left lung base. No left-sided pleural effusion. No pneumothorax. Upper Abdomen: No acute abnormality. Musculoskeletal: Subacute healing nondisplaced fracture of the posterolateral right seventh and eighth ribs. Additional chronic healed right-sided rib fractures. Diffuse ankylosis of the thoracic spine with atypical fracture of the T10 vertebral body adjacent to the superior endplate. Small amount of prevertebral hematoma at this level. Degenerative changes of both shoulders. IMPRESSION: 1. Acute atypical fracture of the T10 vertebral body adjacent to the superior endplate. Small amount of prevertebral hematoma at this level. See dedicated thoracic spine CT for full detail. 2. Subacute healing nondisplaced fracture of the posterolateral right seventh and eighth ribs. 3. Trace right pleural effusion with mild overlying atelectasis, improving compared to the prior CT. 4. Dependent  subsegmental atelectasis or consolidation within the left lung base. 5. 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 Electronically Signed   By: Duanne Guess D.O.   On: 12/10/2022 10:10   CT T-SPINE NO CHARGE  Result Date: 12/10/2022 CLINICAL DATA:  Fall.  Bruising in the mid back.  Mid back pain. EXAM: CT THORACIC SPINE WITHOUT CONTRAST TECHNIQUE: Multidetector CT images of the thoracic were obtained using the standard protocol without intravenous contrast. RADIATION DOSE REDUCTION: This exam was performed according to the departmental dose-optimization program which includes automated exposure control, adjustment of the mA and/or kV according to patient size and/or use of iterative reconstruction technique. COMPARISON:  CT chest 10/27/2022 FINDINGS: Alignment: No significant listhesis is present. Thoracic kyphosis is stable. Vertebrae: A superior endplate fracture is present at T10 without  significant compression. No retropulsed bone is present. Subacute right-sided rib fractures are better visualized on the chest CT of the same day. Ankylosis is present through the remainder of the thoracic and lumbar spine. No other acute fractures are present. Paraspinal and other soft tissues: Intramuscular hematoma is present in the left paraspinous musculature at the T10 level. Stranding is present about the disc space at the level the fracture. The paraspinous soft tissues are otherwise within normal limits. Right greater than left basilar atelectasis is present. Disc levels: Moderate foraminal stenosis on the left at T9-10 is exacerbated by the fracture. IMPRESSION: 1. Superior endplate fracture at T10 without significant compression. 2. Intramuscular hematoma in the left paraspinous musculature at the T10 level. 3. Moderate foraminal stenosis on the left at T9-10 is exacerbated by the fracture. 4. 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 5. Right greater than left basilar  atelectasis. These results were called by telephone at the time of interpretation on 12/10/2022 at 10:02 am to provider [facility] , who verbally acknowledged these results. Electronically Signed   By: Marin Roberts M.D.   On: 12/10/2022 10:04   CT Cervical Spine Wo Contrast  Result Date: 12/10/2022 CLINICAL DATA:  Fall 5 days ago. Trauma to mid back. Pain in the back. EXAM: CT CERVICAL SPINE WITHOUT CONTRAST TECHNIQUE: Multidetector CT imaging of the cervical spine was performed without intravenous contrast. Multiplanar CT image reconstructions were also generated. RADIATION DOSE REDUCTION: This exam was performed according to the departmental dose-optimization program which includes automated exposure control, adjustment of the mA and/or kV according to patient size and/or use of iterative reconstruction technique. COMPARISON:  CT of the cervical spine 05/14/2020 FINDINGS: Alignment: Grade 1 anterolisthesis at C6-7 is stable. Straightening of the normal cervical lordosis is present. No other significant stenosis is present. Skull base and vertebrae: Craniocervical junction is normal. Vertebral body heights are normal. Soft tissues and spinal canal: No prevertebral fluid or swelling. No visible canal hematoma. Disc levels: Ankylosis is present from C2 through the lowest imaged level, T5-6. Facet hypertrophy contributes to left foraminal narrowing at C4-5 and right foraminal narrowing at C6-7. Upper chest: The lung apices are clear. Atherosclerotic calcifications are present at the aortic arch and great vessels. IMPRESSION: 1. No acute fracture or traumatic subluxation. 2. Ankylosis from C2 through the lowest imaged level, T5-6. 3. Facet hypertrophy contributes to left foraminal narrowing at C4-5 and right foraminal narrowing at C6-7. 4.  Aortic Atherosclerosis (ICD10-I70.0). Electronically Signed   By: Marin Roberts M.D.   On: 12/10/2022 09:51   CT HEAD WO CONTRAST ( )  Result Date:  12/10/2022 CLINICAL DATA:  Fall.  Patient hit head and mid back. EXAM: CT HEAD WITHOUT CONTRAST TECHNIQUE: Contiguous axial images were obtained from the base of the skull through the vertex without intravenous contrast. RADIATION DOSE REDUCTION: This exam was performed according to the departmental dose-optimization program which includes automated exposure control, adjustment of the mA and/or kV according to patient size and/or use of iterative reconstruction technique. COMPARISON:  CT head without contrast/6/22 FINDINGS: Brain: Mild generalized atrophy and white matter disease is similar to the prior exam. No acute infarct, hemorrhage, or mass lesion is present. The ventricles are of normal size. No significant extraaxial fluid collection is present. The brainstem and cerebellum are within normal limits. Midline structures are within normal limits. Vascular: Atherosclerotic calcifications are present within the cavernous internal carotid arteries. No hyperdense vessel is present. Skull: Calvarium is intact. No focal lytic  or blastic lesions are present. No significant extracranial soft tissue lesion is present. Sinuses/Orbits: Minimal fluid is present in the right maxillary sinus. The paranasal sinuses and mastoid air cells are otherwise clear. Bilateral lens replacements are noted. Globes and orbits are otherwise unremarkable. IMPRESSION: 1. No acute intracranial abnormality or significant interval change. 2. Mild generalized atrophy and white matter disease likely reflects the sequela of chronic microvascular ischemia. 3. Minimal fluid in the right maxillary sinus. Electronically Signed   By: Marin Roberts M.D.   On: 12/10/2022 09:47    Microbiology: Recent Results (from the past 240 hour(s))  Culture, blood (Routine X 2) w Reflex to ID Panel     Status: None   Collection Time: 12/14/22  9:53 AM   Specimen: BLOOD RIGHT HAND  Result Value Ref Range Status   Specimen Description BLOOD RIGHT HAND   Final   Special Requests   Final    BOTTLES DRAWN AEROBIC AND ANAEROBIC Blood Culture adequate volume   Culture   Final    NO GROWTH 5 DAYS Performed at [facility], [address], [place], [place] [address]    Report Status 12/19/2022 FINAL  Final  Culture, blood (Routine X 2) w Reflex to ID Panel     Status: None   Collection Time: 12/14/22 10:07 AM   Specimen: BLOOD RIGHT HAND  Result Value Ref Range Status   Specimen Description BLOOD RIGHT HAND  Final   Special Requests   Final    BOTTLES DRAWN AEROBIC AND ANAEROBIC Blood Culture adequate volume   Culture   Final    NO GROWTH 5 DAYS Performed at [facility], [address], [place], [place] [address]    Report Status 12/19/2022 FINAL  Final  Urine Culture     Status: Abnormal   Collection Time: 12/14/22  6:10 PM   Specimen: Urine, Random  Result Value Ref Range Status   Specimen Description   Final    URINE, RANDOM Performed at [facility], [address] [place], [place] [address]    Special Requests   Final    NONE Reflexed from [id number] Performed at [facility], [address] [place], [place] [address]    Culture (A)  Final    <10,000 COLONIES/mL INSIGNIFICANT GROWTH Performed at [facility], [address], [place], [place] [address]    Report Status 12/17/2022 FINAL  Final     Labs: CBC: Recent Labs  Lab 12/15/22 0607 12/16/22 0543 12/17/22 0643 12/18/22 1039 12/19/22 0427  WBC 10.4 7.4 9.5 15.0* 9.3  HGB 14.4 13.6 14.9 14.6 14.0  HCT 41.9 39.8 43.8 42.4 41.6  MCV 95.4 94.8 94.4 94.2 95.4  PLT 150 157 200 238 229   Basic Metabolic Panel: Recent Labs  Lab 12/14/22 0534 12/15/22 0607 12/16/22 0543 12/17/22 0643 12/18/22 1039  NA 137 141 139 134* 136  K 3.6 3.4* 3.7 3.7 3.8  CL 104 106 106 100 100  CO2 23 25 25 23 23   GLUCOSE 156* 131* 122* 116* 168*  BUN 10 14 10 9 14   CREATININE 0.75 0.88 0.74 0.64 0.77  CALCIUM 9.2 8.7* 8.7* 8.7* 9.2   MG 1.8 1.9 1.9  --   --   PHOS 2.9 3.6 3.9  --   --    Liver Function Tests: No results for input(s): "AST", "ALT", "ALKPHOS", "BILITOT", "PROT", "ALBUMIN" in the last 168 hours. No results for input(s): "LIPASE", "AMYLASE" in the last 168 hours.  No results for input(s): "AMMONIA" in the last 168 hours. Cardiac Enzymes: No results for input(s): "CKTOTAL", "CKMB", "CKMBINDEX", "TROPONINI" in the last 168 hours. BNP (last 3 results) No results for input(s): "BNP" in the last 8760 hours. CBG: Recent Labs  Lab 12/18/22 0905  GLUCAP 102*    Time spent: 35 minutes  Signed:  Gillis Santa  [facility] 12/20/2022 1:34 PM

## 2022-12-20 NOTE — Progress Notes (Signed)
This RN has attempted to provide report to [facility] 5 times. The first attempt, Ciara told me to call back. The second, third, fourth and fifth time, no one answered.

## 2022-12-20 NOTE — Progress Notes (Signed)
Report given to [place] at [facility].

## 2022-12-20 NOTE — Plan of Care (Signed)

## 2022-12-21 ENCOUNTER — Ambulatory Visit: Payer: TRICARE For Life (TFL) | Admitting: Neurosurgery

## 2023-01-01 ENCOUNTER — Encounter: Payer: Self-pay | Admitting: Physician Assistant

## 2023-01-01 ENCOUNTER — Ambulatory Visit (INDEPENDENT_AMBULATORY_CARE_PROVIDER_SITE_OTHER): Payer: Medicare Other | Admitting: Physician Assistant

## 2023-01-01 VITALS — BP 110/64 | Ht 64.0 in | Wt 152.0 lb

## 2023-01-01 DIAGNOSIS — S22078A Other fracture of T9-T10 vertebra, initial encounter for closed fracture: Secondary | ICD-10-CM | POA: Diagnosis not present

## 2023-01-01 DIAGNOSIS — S22078D Other fracture of T9-T10 vertebra, subsequent encounter for fracture with routine healing: Secondary | ICD-10-CM

## 2023-01-01 DIAGNOSIS — W19XXXA Unspecified fall, initial encounter: Secondary | ICD-10-CM

## 2023-01-01 NOTE — Progress Notes (Signed)
   Progress Note: Referring Physician:  Mick Sell, MD [address] [place],  [place] [address]  Primary Physician:  Mick Sell, MD  Chief Complaint:  Fall on 12/10/22 with thoracic superior endplate T10 and inferior endplate T9.  Patient with a PMH significant for HTN, HLD, depression, BPH, chronic pain, alcohol dependence.    History of Present Illness: Naki Wax is a 82 y.o. male who presents with the chief complaint of inferior endplate fracture of T9 and superior endplate fracture of T10.  He does have a past medical history of alcohol dependence contributing to his falls.  Overall he is doing very well.  He is having very little back pain.  He is wearing his TLSO brace when moving.  He was just discharged from rehab today. No new weakness, numbness, and tingling.   Exam: Today's Vitals   01/01/23 1424  BP: 110/64  Weight: 152 lb (68.9 kg)  Height: 5\' 4"  (1.626 m)  PainSc: 0-No pain   Body mass index is 26.09 kg/m.    NEUROLOGICAL:  General: In no acute distress.   Awake, alert, oriented to person, place, and time.  Pupils equal round and reactive to light.  Facial tone is symmetric.   ROM of spine:   Palpation of spine: non-tender to palpation of his thoracic spine.   Strength: Side Biceps Triceps Deltoid Interossei Grip Wrist Ext. Wrist Flex.  R 5 5 5 5 5 5 5   L 5 5 5 5 5 5 5     Full 5/5 strength to bilateral lower extremities  Imaging: MRI Thoracic spine:   IMPRESSION: 1. Acute fracture subjacent to the superior endplate of T10 with edema in the upper third of the T10 vertebral body. 2. Acute nondisplaced fracture along the inferior endplate of T9 with edema in the inferior endplate of T9. 3. Soft tissue swelling and hemorrhage at the T9-10 level. 4. Mild foraminal narrowing bilaterally at T9-10. Stenosis was exaggerated by CT. 5. Small right pleural effusion.  I have personally reviewed the images and agree with the above  interpretation.  Assessment and Plan: Mr. Burczyk is a pleasant 82 y.o. male who presents with the chief complaint of inferior endplate fracture of T9 and superior endplate fracture of T10.  He does have a past medical history of alcohol dependence contributing to his falls.  Overall he is doing very well.  He is having very little back pain.  He is wearing his TLSO brace when moving.  He was just discharged from rehab today. No new weakness, numbness, and tingling.  He has full strength on examination to both upper and lower extremities.  He has sensation to light touch throughout.  Is a pleasure to see this patient in clinic today.  We did discuss wearing the TLSO brace when patient was not recumbent.  Most importantly we discussed fall risk and the importance of avoiding falls at all costs.  Plan to see back in clinic in 2 months.   I spent a total of 45 minutes in both face-to-face and non-face-to-face activities for this visit on the date of this encounter.   Joan Flores PA-C Neurosurgery

## 2023-01-09 DIAGNOSIS — R7303 Prediabetes: Secondary | ICD-10-CM | POA: Insufficient documentation

## 2023-01-18 ENCOUNTER — Ambulatory Visit
Payer: Medicare Other | Attending: Student in an Organized Health Care Education/Training Program | Admitting: Student in an Organized Health Care Education/Training Program

## 2023-01-18 ENCOUNTER — Ambulatory Visit: Payer: TRICARE For Life (TFL) | Admitting: Neurosurgery

## 2023-01-18 ENCOUNTER — Encounter: Payer: Self-pay | Admitting: Student in an Organized Health Care Education/Training Program

## 2023-01-18 VITALS — BP 128/72 | HR 65 | Temp 97.5°F | Resp 20 | Ht 66.0 in | Wt 150.0 lb

## 2023-01-18 DIAGNOSIS — G8929 Other chronic pain: Secondary | ICD-10-CM | POA: Insufficient documentation

## 2023-01-18 DIAGNOSIS — G894 Chronic pain syndrome: Secondary | ICD-10-CM | POA: Insufficient documentation

## 2023-01-18 DIAGNOSIS — S2241XS Multiple fractures of ribs, right side, sequela: Secondary | ICD-10-CM | POA: Insufficient documentation

## 2023-01-18 DIAGNOSIS — M48062 Spinal stenosis, lumbar region with neurogenic claudication: Secondary | ICD-10-CM | POA: Insufficient documentation

## 2023-01-18 DIAGNOSIS — S12101S Unspecified nondisplaced fracture of second cervical vertebra, sequela: Secondary | ICD-10-CM | POA: Diagnosis present

## 2023-01-18 DIAGNOSIS — M5481 Occipital neuralgia: Secondary | ICD-10-CM | POA: Insufficient documentation

## 2023-01-18 DIAGNOSIS — M5416 Radiculopathy, lumbar region: Secondary | ICD-10-CM | POA: Diagnosis not present

## 2023-01-18 DIAGNOSIS — M47816 Spondylosis without myelopathy or radiculopathy, lumbar region: Secondary | ICD-10-CM | POA: Diagnosis not present

## 2023-01-18 DIAGNOSIS — S22000S Wedge compression fracture of unspecified thoracic vertebra, sequela: Secondary | ICD-10-CM | POA: Insufficient documentation

## 2023-01-18 MED ORDER — TRAMADOL HCL 50 MG PO TABS: 50.0000 mg | ORAL_TABLET | Freq: Four times a day (QID) | ORAL | 2 refills | Status: DC | PRN

## 2023-01-18 MED ORDER — TRAMADOL HCL 50 MG PO TABS: 50.0000 mg | ORAL_TABLET | Freq: Four times a day (QID) | ORAL | 0 refills | Status: DC | PRN

## 2023-01-18 NOTE — Progress Notes (Signed)
 End of November fell and suffered 2 fx'd vertebrae and 2 fx'd ribs on the right.  Is currently wearing an immobilization device.   Nursing Pain Medication Assessment:  Safety precautions to be maintained throughout the outpatient stay will include: orient to surroundings, keep bed in low position, maintain call bell within reach at all times, provide assistance with transfer out of bed and ambulation.  Medication Inspection Compliance: Zachary Fernandez did not comply with our request to bring his pills to be counted. He was reminded that bringing the medication bottles, even when empty, is a requirement.  Medication: None brought in. Pill/Patch Count: None available to be counted. Bottle Appearance: No container available. Did not bring bottle(s) to appointment. Filled Date: N/A Last Medication intake:  Ran out of medicine more than 48 hours ago

## 2023-01-18 NOTE — Progress Notes (Signed)
 PROVIDER NOTE: Information contained herein reflects review and annotations entered in association with encounter. Interpretation of such information and data should be left to medically-trained personnel. Information provided to patient can be located elsewhere in the medical record under Patient Instructions. Document created using STT-dictation technology, any transcriptional errors that may result from process are unintentional.    Patient: Zachary Fernandez  Service Category: E/M  Provider: Wallie Sherry, MD  DOB: 05/11/1940  DOS: 01/18/2023  Referring Provider: Epifanio Alm SQUIBB, MD  MRN: [id number]  Specialty: Interventional Pain Management  PCP: Epifanio Alm SQUIBB, MD  Type: Established Patient  Setting: Ambulatory outpatient    Location: Office  Delivery: Face-to-face     HPI  Zachary Fernandez, a 83 y.o. 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 Mr. Chatterjee primary complain today is Back Pain (Lumbar right SI ) Last encounter: My last encounter with him was on 08/31/22 Pertinent problems: Mr. Sena has Chronic SI joint pain; Status post hip replacement, bilateral; History of bilateral knee replacement; Spinal stenosis, lumbar region, with neurogenic claudication; Chronic radicular lumbar pain; Lumbar spondylosis; Chronic pain syndrome; Pain management contract signed; Whiplash injury to neck; and Closed nondisplaced fracture of second cervical vertebra (HCC) on their pertinent problem list. Pain Assessment: Severity of Chronic pain is reported as a 0-No pain/10. Location: Back Lateral, Right/moves across the back to where the fx's are.. Onset: More than a month ago. Quality: Discomfort, Constant. Timing: Constant. Modifying factor(s): tramadol . Vitals:  height is 5' 6 (1.676 m) and weight is 150 lb (68 kg). His temporal temperature is 97.5 F (36.4 C) (abnormal). His blood pressure is 128/72 and his pulse is 65. His respiration is 20 and oxygen  saturation is 99%.   Reason for encounter: medication management. Discussed the use of AI scribe software for clinical note transcription with the patient, who gave verbal consent to proceed.  History of Present Illness   Since last visit, patient sustained a fall resulting in right rib fractures and thoracic compression fracture: inferior end plate fracture of T9 and superior end plate fracture of T10. He reports improvement in his condition over the past month. He has been managing his pain with a lidocaine  patch and a TLSO brace, which he wears throughout the day. However, he ran out of tramadol  some time ago and has been without it.       Pharmacotherapy Assessment  Analgesic: Tramadol  50-100 mg BID prn   Monitoring: [place] PMP: PDMP reviewed during this encounter.       Pharmacotherapy: No side-effects or adverse reactions reported. Compliance: No problems identified. Effectiveness: Clinically acceptable.  Jakie Chrissie MATSU, RN  01/18/2023  9:39 AM  Sign when Signing Visit End of November fell and suffered 2 fx'd vertebrae and 2 fx'd ribs on the right.  Is currently wearing an immobilization device.   Nursing Pain Medication Assessment:  Safety precautions to be maintained throughout the outpatient stay will include: orient to surroundings, keep bed in low position, maintain call bell within reach at all times, provide assistance with transfer out of bed and ambulation.  Medication Inspection Compliance: Mr. Ortman did not comply with our request to bring his pills to be counted. He was reminded that bringing the medication bottles, even when empty, is a requirement.  Medication: None brought in. Pill/Patch Count: None available to be counted. Bottle Appearance: No container available. Did not bring bottle(s) to appointment. Filled Date: N/A Last Medication intake:  Ran out  of medicine more than 48 hours ago    UDS:  Summary  Date Value Ref Range Status  02/16/2020 Note  Final     Comment:    ==================================================================== Compliance Drug Analysis, Ur ==================================================================== Test                             Result       Flag       Units  Drug Present and Declared for Prescription Verification   Tramadol                        >3247        EXPECTED   ng/mg creat   O-Desmethyltramadol            >3247        EXPECTED   ng/mg creat   N-Desmethyltramadol            964          EXPECTED   ng/mg creat    Source of tramadol  is a prescription medication. O-desmethyltramadol    and N-desmethyltramadol are expected metabolites of tramadol .    Desipramine                     PRESENT      EXPECTED    Desipramine  may be administered as a prescription drug; it is also    an expected metabolite of imipramine.  Drug Absent but Declared for Prescription Verification   Gabapentin                      Not Detected UNEXPECTED ==================================================================== Test                      Result    Flag   Units      Ref Range   Creatinine              154              mg/dL      >=79 ==================================================================== Declared Medications:  The flagging and interpretation on this report are based on the  following declared medications.  Unexpected results may arise from  inaccuracies in the declared medications.   **Note: The testing scope of this panel includes these medications:   Desipramine  (Norpramin )  Gabapentin  (Neurontin )  Tramadol  (Ultram )   **Note: The testing scope of this panel does not include the  following reported medications:   Amlodipine  (Norvasc )  Bisoprolol   Calcium   Hydrochlorothiazide   Multivitamin  Rosuvastatin  (Crestor )  Supplement  Tamsulosin  (Flomax )  Testosterone  Turmeric ==================================================================== For clinical consultation, please call [phone]. ====================================================================      ROS  Constitutional: Denies any fever or chills Gastrointestinal: No reported hemesis, hematochezia, vomiting, or acute GI distress Musculoskeletal:  Low back pain with radiation into right leg Neurological: No reported episodes of acute onset apraxia, aphasia, dysarthria, agnosia, amnesia, paralysis, loss of coordination, or loss of consciousness  Medication Review  Calcium  Citrate, Multiple Vitamins-Minerals, Turmeric, acetaminophen , bisacodyl , cyclobenzaprine , desipramine , lidocaine , metoprolol  tartrate, polyethylene glycol, rosuvastatin , thiamine , traMADol , traZODone , and vitamin B-12  History Review  Allergy: Mr. Colello has no known allergies. Drug: Mr. Hudgins  reports no history of drug use. Alcohol:  reports current alcohol use. Tobacco:  reports that he has never smoked. He has never used smokeless tobacco. Social: Mr. Garrabrant  reports that he has never smoked. He  has never used smokeless tobacco. He reports current alcohol use. He reports that he does not use drugs. Medical:  has a past medical history of EtOH dependence (HCC), HLD (hyperlipidemia), and Hypertension. Surgical: Mr. Garner  has a past surgical history that includes Revision total hip arthroplasty (Bilateral); Knee Arthroplasty (Left); Shoulder surgery (Left); Cholecystectomy; and Hip Closed Reduction (Right, 12/12/2021). Family: family history includes Diabetes in his brother.  Laboratory Chemistry Profile   Renal Lab Results  Component Value Date   BUN 14 12/18/2022   CREATININE 0.77 12/18/2022   GFRNONAA >60 12/18/2022    Hepatic Lab Results  Component Value Date   AST 27 12/10/2022   ALT 21 12/10/2022   ALBUMIN 3.9 12/10/2022   ALKPHOS 92 12/10/2022    Electrolytes Lab Results  Component Value Date   NA 136 12/18/2022   K 3.8 12/18/2022   CL 100 12/18/2022   CALCIUM  9.2 12/18/2022   MG 1.9 12/16/2022   PHOS 3.9  12/16/2022    Bone Lab Results  Component Value Date   VD25OH 34.15 12/13/2022    Inflammation (CRP: Acute Phase) (ESR: Chronic Phase) Lab Results  Component Value Date   LATICACIDVEN 1.3 12/14/2022         Note: Above Lab results reviewed.  Recent Imaging Review  DG THORACOLUMBAR SPINE CLINICAL DATA:  T10 fracture.  EXAM: THORACOLUMBAR SPINE 1V  COMPARISON:  Thoracic spine MRI dated 12/10/2022 and CT dated 12/10/2022.  FINDINGS: Evaluation is limited due to overlying hardware and osteopenia. The fracture seen on the CT and MRI are poorly visualized on this radiograph. The bones are osteopenic. Multilevel degenerative changes.  IMPRESSION: The fracture seen on the CT and MRI are poorly visualized on this radiograph.  Electronically Signed   By: Vanetta Chou M.D.   On: 12/18/2022 19:03 Note: Reviewed        Physical Exam  General appearance: Well nourished, well developed, and well hydrated. In no apparent acute distress Mental status: Alert, oriented x 3 (person, place, & time)       Respiratory: No evidence of acute respiratory distress Eyes: PERLA Vitals: BP 128/72 (BP Location: Left Arm, Patient Position: Sitting, Cuff Size: Normal)   Pulse 65   Temp (!) 97.5 F (36.4 C) (Temporal)   Resp 20   Ht 5' 6 (1.676 m)   Wt 150 lb (68 kg)   SpO2 99%   BMI 24.21 kg/m  BMI: Estimated body mass index is 24.21 kg/m as calculated from the following:   Height as of this encounter: 5' 6 (1.676 m).   Weight as of this encounter: 150 lb (68 kg). Ideal: Ideal body weight: 63.8 kg (140 lb 10.5 oz) Adjusted ideal body weight: 65.5 kg (144 lb 6.3 oz)  Pt wearing TLSO brace  Lumbar Spine Area Exam  Skin & Axial Inspection: No masses, redness, or swelling Alignment: Symmetrical Functional ROM: Pain restricted ROM       Stability: No instability detected Muscle Tone/Strength: Functionally intact. No obvious neuro-muscular anomalies detected. Sensory  (Neurological): Dermatomal pain pattern   Gait & Posture Assessment  Ambulation: Limited Gait: Antalgic gait (limping) Posture: Difficulty standing up straight, due to pain  Lower Extremity Exam    Side: Right lower extremity  Side: Left lower extremity  Stability: No instability observed          Stability: No instability observed          Skin & Extremity Inspection: Skin color, temperature, and hair growth are WNL. No  peripheral edema or cyanosis. No masses, redness, swelling, asymmetry, or associated skin lesions. No contractures.  Skin & Extremity Inspection: Skin color, temperature, and hair growth are WNL. No peripheral edema or cyanosis. No masses, redness, swelling, asymmetry, or associated skin lesions. No contractures.  Functional ROM: Pain restricted ROM. Positive straight leg raise test on the right          Functional ROM: Unrestricted ROM                  Muscle Tone/Strength: Functionally intact. No obvious neuro-muscular anomalies detected.  Muscle Tone/Strength: Functionally intact. No obvious neuro-muscular anomalies detected.  Sensory (Neurological): Dermatomal pain pattern        Sensory (Neurological): Unimpaired        DTR: Patellar: deferred today Achilles: deferred today Plantar: deferred today  DTR: Patellar: deferred today Achilles: deferred today Plantar: deferred today  Palpation: No palpable anomalies  Palpation: No palpable anomalies    Assessment   Diagnosis Status  1. Chronic radicular lumbar pain   2. Thoracic compression fracture, sequela   3. Closed fracture of multiple ribs of right side, sequela   4. Spinal stenosis, lumbar region, with neurogenic claudication   5. Lumbar spondylosis   6. Bilateral occipital neuralgia   7. Closed nondisplaced fracture of second cervical vertebra, unspecified fracture morphology, sequela   8. Chronic pain syndrome      Controlled Controlled Controlled     Plan of Care  1. Thoracic compression  fracture, sequela  2. Closed fracture of multiple ribs of right side, sequela  3. Chronic radicular lumbar pain (Primary) - traMADol  (ULTRAM ) 50 MG tablet; Take 1 tablet (50 mg total) by mouth every 6 (six) hours as needed for severe pain (pain score 7-10).  Dispense: 120 tablet; Refill: 2  4. Spinal stenosis, lumbar region, with neurogenic claudication  5. Lumbar spondylosis  6. Bilateral occipital neuralgia  7. Closed nondisplaced fracture of second cervical vertebra, unspecified fracture morphology, sequela  8. Chronic pain syndrome - traMADol  (ULTRAM ) 50 MG tablet; Take 1 tablet (50 mg total) by mouth every 6 (six) hours as needed for severe pain (pain score 7-10).  Dispense: 120 tablet; Refill: 2   Requested Prescriptions   Signed Prescriptions Disp Refills   traMADol  (ULTRAM ) 50 MG tablet 120 tablet 2    Sig: Take 1 tablet (50 mg total) by mouth every 6 (six) hours as needed for severe pain (pain score 7-10).    Follow-up plan:   Return in about 3 months (around 04/18/2023) for MM, F2F.     Interventional management options:  Considering: Occipital nerve block   C2-C3 medial branch nerve block Lumbar epidural steroid injection Lumbar facet medial branch nerve blocks S1-S3 lateral branch nerve blocks SI joint injection Sprint peripheral nerve stimulation   PRN Procedures:   None at this time       Recent Visits No visits were found meeting these conditions. Showing recent visits within past 90 days and meeting all other requirements Today's Visits Date Type Provider Dept  01/18/23 Office Visit Marcelino Nurse, MD [facility]  Showing today's visits and meeting all other requirements Future Appointments Date Type Provider Dept  04/12/23 Appointment Marcelino Nurse, MD [facility]  Showing future appointments within next 90 days and meeting all other requirements  I discussed the assessment and treatment plan with the patient. The patient was  provided an opportunity to ask questions and all were answered. The patient agreed with the plan and demonstrated  an understanding of the instructions.  Patient advised to call back or seek an in-person evaluation if the symptoms or condition worsens.  Duration of encounter: .  Total time on encounter, as per AMA guidelines included both the face-to-face and non-face-to-face time personally spent by the physician and/or other qualified health care professional(s) on the day of the encounter (includes time in activities that require the physician or other qualified health care professional and does not include time in activities normally performed by clinical staff). Physician's time may include the following activities when performed: preparing to see the patient (eg, review of tests, pre-charting review of records) obtaining and/or reviewing separately obtained history performing a medically appropriate examination and/or evaluation counseling and educating the patient/family/caregiver ordering medications, tests, or procedures referring and communicating with other health care professionals (when not separately reported) documenting clinical information in the electronic or other health record independently interpreting results (not separately reported) and communicating results to the patient/ family/caregiver care coordination (not separately reported)  Note by: Wallie Sherry, MD Date: 01/18/2023; Time: 10:27 AM

## 2023-02-08 ENCOUNTER — Telehealth: Payer: Self-pay | Admitting: Physician Assistant

## 2023-02-08 DIAGNOSIS — S22078D Other fracture of T9-T10 vertebra, subsequent encounter for fracture with routine healing: Secondary | ICD-10-CM

## 2023-02-08 NOTE — Telephone Encounter (Signed)
Referral has been placed.

## 2023-02-08 NOTE — Telephone Encounter (Signed)
Please send to [facility] for strengthening.

## 2023-02-08 NOTE — Telephone Encounter (Signed)
Patient's wife, Santina Evans is calling to request a referral for outpatient physical therapy.

## 2023-02-19 ENCOUNTER — Ambulatory Visit: Payer: TRICARE For Life (TFL) | Admitting: Physician Assistant

## 2023-02-19 ENCOUNTER — Ambulatory Visit: Admit: 2023-02-19 | Payer: TRICARE For Life (TFL) | Admitting: Gastroenterology

## 2023-02-19 SURGERY — ESOPHAGOGASTRODUODENOSCOPY (EGD) WITH PROPOFOL
Anesthesia: General

## 2023-03-01 ENCOUNTER — Ambulatory Visit: Payer: TRICARE For Life (TFL) | Admitting: Neurosurgery

## 2023-03-06 ENCOUNTER — Other Ambulatory Visit: Payer: Self-pay

## 2023-03-06 DIAGNOSIS — S22078D Other fracture of T9-T10 vertebra, subsequent encounter for fracture with routine healing: Secondary | ICD-10-CM

## 2023-03-07 ENCOUNTER — Ambulatory Visit (INDEPENDENT_AMBULATORY_CARE_PROVIDER_SITE_OTHER): Payer: Medicare Other | Admitting: Physician Assistant

## 2023-03-07 ENCOUNTER — Ambulatory Visit
Admission: RE | Admit: 2023-03-07 | Discharge: 2023-03-07 | Disposition: A | Discharge status: Home / Self Care | Payer: Medicare Other | Source: Ambulatory Visit | Attending: Physician Assistant | Admitting: Physician Assistant

## 2023-03-07 VITALS — BP 126/78 | Ht 66.0 in | Wt 150.0 lb

## 2023-03-07 DIAGNOSIS — W19XXXD Unspecified fall, subsequent encounter: Secondary | ICD-10-CM | POA: Diagnosis not present

## 2023-03-07 DIAGNOSIS — S22078D Other fracture of T9-T10 vertebra, subsequent encounter for fracture with routine healing: Secondary | ICD-10-CM

## 2023-03-07 NOTE — Progress Notes (Unsigned)
   Progress Note: Referring Physician:  Mick Sell, MD [address] [place],  [place] [address]  Primary Physician:  Zachary Sell, MD  Chief Complaint:  Fall on 12/10/22 with thoracic superior endplate T10 and inferior endplate T9.  Patient with a PMH significant for HTN, HLD, depression, BPH, chronic pain, alcohol dependence.    History of Present Illness: Zachary Fernandez is a 83 y.o. male who presents with the chief complaint of inferior endplate fracture of T9 and superior endplate fracture of T10.  He does have a past medical history of alcohol dependence contributing to his falls.  Overall he is doing very well.  He states he is in no pain.  He denies any recent falls.  He has not been wearing a brace and is at home and doing very well.  He is eager to get back to the gym.  Exam: Today's Vitals   03/07/23 1315  BP: 126/78  Weight: 150 lb (68 kg)  Height: 5\' 6"  (1.676 m)  PainSc: 0-No pain  PainLoc: Back   Body mass index is 24.21 kg/m.    NEUROLOGICAL:  General: In no acute distress.   Awake, alert, oriented to person, place, and time.  Pupils equal round and reactive to light.  Facial tone is symmetric.   ROM of spine:   Palpation of spine: non-tender to palpation of his thoracic spine.   Strength: Side Biceps Triceps Deltoid Interossei Grip Wrist Ext. Wrist Flex.  R 5 5 5 5 5 5 5   L 5 5 5 5 5 5 5     Full 5/5 strength to bilateral lower extremities  Imaging: MRI Thoracic spine:   IMPRESSION: 1. Acute fracture subjacent to the superior endplate of T10 with edema in the upper third of the T10 vertebral body. 2. Acute nondisplaced fracture along the inferior endplate of T9 with edema in the inferior endplate of T9. 3. Soft tissue swelling and hemorrhage at the T9-10 level. 4. Mild foraminal narrowing bilaterally at T9-10. Stenosis was exaggerated by CT. 5. Small right pleural effusion.  I have personally reviewed the images and agree with the  above interpretation.  Assessment and Plan: [name] is a pleasant 83 y.o. male who presents with the chief complaint of inferior endplate fracture of T9 and superior endplate fracture of T10.  He does have a past medical history of alcohol dependence contributing to his falls.  Overall he is doing very well.  He states he is in no pain.  He denies any recent falls.  He has not been wearing a brace and is at home and doing very well.  He is eager to get back to the gym.No new weakness, numbness, and tingling.  He has full strength on examination to both upper and lower extremities.  He has sensation to light touch throughout.  X-ray that was completed today and reviewed by radiology, but I have personally reviewed the imaging and did not see any significant worsening of his current fractures.  It was a pleasure to see this patient in clinic today.  We discussed activities to avoid, understanding he is eager to work out within reason.  Red flag symptoms were reviewed in depth.  He was encouraged to reach out to [place] if he had any changes to his symptoms or questions or concerns moving forward.  Encouraged him to monitor his drinking habits and avoid falls at all costs.   Zachary Flores PA-C Neurosurgery

## 2023-03-13 ENCOUNTER — Ambulatory Visit
Attending: Student in an Organized Health Care Education/Training Program | Admitting: Student in an Organized Health Care Education/Training Program

## 2023-03-13 ENCOUNTER — Other Ambulatory Visit: Payer: Self-pay | Admitting: *Deleted

## 2023-03-13 ENCOUNTER — Encounter: Payer: Self-pay | Admitting: Student in an Organized Health Care Education/Training Program

## 2023-03-13 VITALS — BP 145/72 | HR 78 | Temp 97.2°F | Resp 16 | Ht 65.0 in | Wt 152.0 lb

## 2023-03-13 DIAGNOSIS — S12101S Unspecified nondisplaced fracture of second cervical vertebra, sequela: Secondary | ICD-10-CM | POA: Diagnosis not present

## 2023-03-13 DIAGNOSIS — M5481 Occipital neuralgia: Secondary | ICD-10-CM | POA: Insufficient documentation

## 2023-03-13 DIAGNOSIS — G588 Other specified mononeuropathies: Secondary | ICD-10-CM | POA: Insufficient documentation

## 2023-03-13 DIAGNOSIS — G8929 Other chronic pain: Secondary | ICD-10-CM | POA: Diagnosis present

## 2023-03-13 DIAGNOSIS — M47816 Spondylosis without myelopathy or radiculopathy, lumbar region: Secondary | ICD-10-CM | POA: Diagnosis present

## 2023-03-13 DIAGNOSIS — M48062 Spinal stenosis, lumbar region with neurogenic claudication: Secondary | ICD-10-CM | POA: Diagnosis present

## 2023-03-13 DIAGNOSIS — M5416 Radiculopathy, lumbar region: Secondary | ICD-10-CM | POA: Insufficient documentation

## 2023-03-13 DIAGNOSIS — G894 Chronic pain syndrome: Secondary | ICD-10-CM | POA: Insufficient documentation

## 2023-03-13 NOTE — Progress Notes (Signed)
 PROVIDER NOTE: Information contained herein reflects review and annotations entered in association with encounter. Interpretation of such information and data should be left to medically-trained personnel. Information provided to patient can be located elsewhere in the medical record under "Patient Instructions". Document created using STT-dictation technology, any transcriptional errors that may result from process are unintentional.    Patient: Zachary Fernandez  Service Category: E/M  Provider: Edward Jolly, MD  DOB: May 27, 1940  DOS: 03/13/2023  Referring Provider: Mick Sell, MD  MRN: [id number]  Specialty: Interventional Pain Management  PCP: Mick Sell, MD  Type: Established Patient  Setting: Ambulatory outpatient    Location: Office  Delivery: Face-to-face     HPI  [name], a 83 y.o. 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 [name] primary complain today is Back Pain (Lumbar left is worse ) Last encounter: My last encounter with him was on 01/18/23 Pertinent problems: [name] has Chronic SI joint pain; Status post hip replacement, bilateral; History of bilateral knee replacement; Spinal stenosis, lumbar region, with neurogenic claudication; Chronic radicular lumbar pain; Lumbar spondylosis; Chronic pain syndrome; Pain management contract signed; Whiplash injury to neck; and Closed nondisplaced fracture of second cervical vertebra (HCC) on their pertinent problem list. Pain Assessment: Severity of Chronic pain is reported as a 10-Worst pain ever/10. Location: Back Lower, Left/across the body and down the right leg, "right smack on the top of the left hip". Onset: More than a month ago. Quality: Constant, Discomfort, Pounding, Stabbing. Timing: Constant. Modifying factor(s): standing up straight, if he bends over the pain is sharp. Vitals:  height is 5\' 5"  (1.651 m) and weight is 152 lb (68.9 kg). His temporal temperature  is 97.2 F (36.2 C) (abnormal). His blood pressure is 145/72 (abnormal) and his pulse is 78. His respiration is 16 and oxygen saturation is 97%.   Reason for encounter: evaluation of worsening, or previously known (established) problem.  Discussed the use of AI scribe software for clinical note transcription with the patient, who gave verbal consent to proceed.  History of Present Illness   Zachary Fernandez is an 83 year old male who presents with chronic left-sided pain due to chronic lumbar radiculopathy and potentially cluneal neuropathy, worse on the left..  He had a caudal epidural steroid injection done in August 2023 that provided him with approximately 60% pain relief for about 6 months.  He is endorsing pain overlying his iliac crest that worsens with flexion extension      ROS  Constitutional: Denies any fever or chills Gastrointestinal: No reported hemesis, hematochezia, vomiting, or acute GI distress Musculoskeletal:  Low back pain with radiation left hip and left and into right leg Neurological: No reported episodes of acute onset apraxia, aphasia, dysarthria, agnosia, amnesia, paralysis, loss of coordination, or loss of consciousness  Medication Review  Calcium Citrate, Lotilaner, Multiple Vitamins-Minerals, Turmeric, acetaminophen, desipramine, metoprolol tartrate, polyethylene glycol, rosuvastatin, thiamine, traMADol, traZODone, and vitamin B-12  History Review  Allergy: [name] has no known allergies. Drug: [name]  reports no history of drug use. Alcohol:  reports current alcohol use. Tobacco:  reports that he has never smoked. He has never used smokeless tobacco. Social: [name]  reports that he has never smoked. He has never used smokeless tobacco. He reports current alcohol use. He reports that he does not use drugs. Medical:  has a past medical history of EtOH dependence (HCC), HLD (hyperlipidemia), and Hypertension. Surgical: [name]  has a past surgical  history that includes Revision total hip arthroplasty (Bilateral); Knee Arthroplasty (Left); Shoulder surgery (Left); Cholecystectomy; and Hip Closed Reduction (Right, 12/12/2021). Family: family history includes Diabetes in his brother.  Laboratory Chemistry Profile   Renal Lab Results  Component Value Date   BUN 14 12/18/2022   CREATININE 0.77 12/18/2022   GFRNONAA >60 12/18/2022    Hepatic Lab Results  Component Value Date   AST 27 12/10/2022   ALT 21 12/10/2022   ALBUMIN 3.9 12/10/2022   ALKPHOS 92 12/10/2022    Electrolytes Lab Results  Component Value Date   NA 136 12/18/2022   K 3.8 12/18/2022   CL 100 12/18/2022   CALCIUM 9.2 12/18/2022   MG 1.9 12/16/2022   PHOS 3.9 12/16/2022    Bone Lab Results  Component Value Date   VD25OH 34.15 12/13/2022    Inflammation (CRP: Acute Phase) (ESR: Chronic Phase) Lab Results  Component Value Date   LATICACIDVEN 1.3 12/14/2022         Note: Above Lab results reviewed.  Recent Imaging Review  DG THORACOLUMBAR SPINE CLINICAL DATA:  T10 fracture.  EXAM: THORACOLUMBAR SPINE 1V  COMPARISON:  Thoracic spine MRI dated 12/10/2022 and CT dated 12/10/2022.  FINDINGS: Evaluation is limited due to overlying hardware and osteopenia. The fracture seen on the CT and MRI are poorly visualized on this radiograph. The bones are osteopenic. Multilevel degenerative changes.  IMPRESSION: The fracture seen on the CT and MRI are poorly visualized on this radiograph.  Electronically Signed   By: Zachary Fernandez M.D.   On: 12/18/2022 19:03 Note: Reviewed        Physical Exam  General appearance: Well nourished, well developed, and well hydrated. In no apparent acute distress Mental status: Alert, oriented x 3 (person, place, & time)       Respiratory: No evidence of acute respiratory distress Eyes: PERLA Vitals: BP (!) 145/72 (BP Location: Right Arm, Patient Position: Sitting, Cuff Size: Normal)   Pulse 78   Temp (!) 97.2 F  (36.2 C) (Temporal)   Resp 16   Ht 5\' 5"  (1.651 m)   Wt 152 lb (68.9 kg)   SpO2 97%   BMI 25.29 kg/m  BMI: Estimated body mass index is 25.29 kg/m as calculated from the following:   Height as of this encounter: 5\' 5"  (1.651 m).   Weight as of this encounter: 152 lb (68.9 kg). Ideal: Ideal body weight: 61.5 kg (135 lb 9.3 oz) Adjusted ideal body weight: 64.5 kg (142 lb 2.4 oz)  Lumbar Spine Area Exam  Skin & Axial Inspection: No masses, redness, or swelling Alignment: Symmetrical Functional ROM: Pain restricted ROM       Stability: No instability detected Muscle Tone/Strength: Functionally intact. No obvious neuro-muscular anomalies detected. Sensory (Neurological): Dermatomal pain pattern   Gait & Posture Assessment  Ambulation: Limited Gait: Antalgic gait (limping) Posture: Difficulty standing up straight, due to pain  Lower Extremity Exam    Side: Right lower extremity  Side: Left lower extremity  Stability: No instability observed          Stability: No instability observed          Skin & Extremity Inspection: Skin color, temperature, and hair growth are WNL. No peripheral edema or cyanosis. No masses, redness, swelling, asymmetry, or associated skin lesions. No contractures.  Skin & Extremity Inspection: Skin color, temperature, and hair growth are WNL. No peripheral edema or cyanosis. No masses, redness, swelling, asymmetry, or associated skin  lesions. No contractures.  Functional ROM: Pain restricted ROM. Positive straight leg raise test on the right          Functional ROM: Unrestricted ROM                  Muscle Tone/Strength: Functionally intact. No obvious neuro-muscular anomalies detected.  Muscle Tone/Strength: Functionally intact. No obvious neuro-muscular anomalies detected.  Sensory (Neurological): Dermatomal pain pattern        Sensory (Neurological): Unimpaired        DTR: Patellar: deferred today Achilles: deferred today Plantar: deferred today   DTR: Patellar: deferred today Achilles: deferred today Plantar: deferred today  Palpation: No palpable anomalies  Palpation: No palpable anomalies    Assessment   Diagnosis Status  1. Chronic radicular lumbar pain   2. Cluneal neuropathy   3. Spinal stenosis, lumbar region, with neurogenic claudication   4. Lumbar spondylosis   5. Closed nondisplaced fracture of second cervical vertebra, unspecified fracture morphology, sequela   6. Bilateral occipital neuralgia   7. Chronic pain syndrome    Having a Flare-up Having a Flare-up Having a Flare-up     Plan of Care   Orders:  Orders Placed This Encounter  Procedures   CLUNEAL NERVE BLOCK    Standing Status:   Future    Expected Date:   04/09/2023    Expiration Date:   06/13/2023    Scheduling Instructions:     Side: LEFT    Where will this procedure be performed?:   [facility]   Caudal Epidural Injection    Standing Status:   Future    Expected Date:   03/28/2023    Expiration Date:   06/13/2023    Scheduling Instructions:     Laterality: LEFT     Level(s): Sacrococcygeal canal (Tailbone area)     Sedation: Patient's choice     Scheduling Timeframe: As soon as pre-approved    Where will this procedure be performed?:   [facility]   Follow-up plan:   Return in about 15 days (around 03/28/2023) for Caudal ESI and Left Cluneal NB, in clinic [facility].     Interventional management options:  Considering: Occipital nerve block   C2-C3 medial branch nerve block Lumbar epidural steroid injection Lumbar facet medial branch nerve blocks S1-S3 lateral branch nerve blocks SI joint injection Sprint peripheral nerve stimulation   PRN Procedures:   None at this time       Recent Visits Date Type Provider Dept  01/18/23 Office Visit Edward Jolly, MD [facility]  Showing recent visits within past 90 days and meeting all other requirements Today's Visits Date Type Provider Dept  03/13/23 Office Visit  Edward Jolly, MD [facility]  Showing today's visits and meeting all other requirements Future Appointments Date Type Provider Dept  04/09/23 Appointment Edward Jolly, MD [facility]  04/12/23 Appointment Edward Jolly, MD [facility]  Showing future appointments within next 90 days and meeting all other requirements  I discussed the assessment and treatment plan with the patient. The patient was provided an opportunity to ask questions and all were answered. The patient agreed with the plan and demonstrated an understanding of the instructions.  Patient advised to call back or seek an in-person evaluation if the symptoms or condition worsens.  Duration of encounter: .  Total time on encounter, as per AMA guidelines included both the face-to-face and non-face-to-face time personally spent by the physician and/or other qualified health  care professional(s) on the day of the encounter (includes time in activities that require the physician or other qualified health care professional and does not include time in activities normally performed by clinical staff). Physician's time may include the following activities when performed: preparing to see the patient (eg, review of tests, pre-charting review of records) obtaining and/or reviewing separately obtained history performing a medically appropriate examination and/or evaluation counseling and educating the patient/family/caregiver ordering medications, tests, or procedures referring and communicating with other health care professionals (when not separately reported) documenting clinical information in the electronic or other health record independently interpreting results (not separately reported) and communicating results to the patient/ family/caregiver care coordination (not separately reported)  Note by: Edward Jolly, MD Date: 03/13/2023; Time: 9:25 AM

## 2023-03-13 NOTE — Progress Notes (Signed)
 Safety precautions to be maintained throughout the outpatient stay will include: orient to surroundings, keep bed in low position, maintain call bell within reach at all times, provide assistance with transfer out of bed and ambulation.

## 2023-04-09 ENCOUNTER — Ambulatory Visit: Admitting: Student in an Organized Health Care Education/Training Program

## 2023-04-09 ENCOUNTER — Telehealth: Payer: Self-pay | Admitting: Student in an Organized Health Care Education/Training Program

## 2023-04-09 NOTE — Telephone Encounter (Signed)
 Has an appt for med refill for Tramadol on 04-12-23 (Thursday).  He is going out of town Wednesday. Can you see him before then, today or tomorrow? Can he fill it 2 days early?

## 2023-04-09 NOTE — Telephone Encounter (Signed)
 PT called stated that he will be leaving to go out of town on Wed. PT wants to know if Zachary Fernandez will allow the pharmacy to fill his Tramadol early. PT stated that he will be gone for a couple of weeks and don't want to be without medications. Please give patient a call. TY

## 2023-04-10 ENCOUNTER — Encounter: Payer: Self-pay | Admitting: Student in an Organized Health Care Education/Training Program

## 2023-04-10 ENCOUNTER — Ambulatory Visit
Payer: Self-pay | Attending: Student in an Organized Health Care Education/Training Program | Admitting: Student in an Organized Health Care Education/Training Program

## 2023-04-10 VITALS — BP 153/77 | HR 92 | Temp 97.8°F | Resp 16 | Ht 66.0 in | Wt 150.0 lb

## 2023-04-10 DIAGNOSIS — M4726 Other spondylosis with radiculopathy, lumbar region: Secondary | ICD-10-CM | POA: Diagnosis not present

## 2023-04-10 DIAGNOSIS — M5481 Occipital neuralgia: Secondary | ICD-10-CM | POA: Diagnosis present

## 2023-04-10 DIAGNOSIS — S12101S Unspecified nondisplaced fracture of second cervical vertebra, sequela: Secondary | ICD-10-CM | POA: Diagnosis present

## 2023-04-10 DIAGNOSIS — G588 Other specified mononeuropathies: Secondary | ICD-10-CM | POA: Insufficient documentation

## 2023-04-10 DIAGNOSIS — M48062 Spinal stenosis, lumbar region with neurogenic claudication: Secondary | ICD-10-CM | POA: Diagnosis present

## 2023-04-10 DIAGNOSIS — G8929 Other chronic pain: Secondary | ICD-10-CM | POA: Diagnosis present

## 2023-04-10 DIAGNOSIS — M47816 Spondylosis without myelopathy or radiculopathy, lumbar region: Secondary | ICD-10-CM | POA: Diagnosis present

## 2023-04-10 DIAGNOSIS — G894 Chronic pain syndrome: Secondary | ICD-10-CM | POA: Insufficient documentation

## 2023-04-10 DIAGNOSIS — M5416 Radiculopathy, lumbar region: Secondary | ICD-10-CM | POA: Diagnosis present

## 2023-04-10 MED ORDER — TRAMADOL HCL 50 MG PO TABS: 50.0000 mg | ORAL_TABLET | Freq: Four times a day (QID) | ORAL | 2 refills | Status: DC | PRN

## 2023-04-10 NOTE — Patient Instructions (Addendum)
Medication Rules  Applies to: All patients receiving prescriptions (written or electronic).  Pharmacy of record: Pharmacy where electronic prescriptions will be sent. If written prescriptions are taken to a different pharmacy, please inform the nursing staff. The pharmacy listed in the electronic medical record should be the one where you would like electronic prescriptions to be sent.  Prescription refills: Only during scheduled appointments. Applies to both, written and electronic prescriptions.  NOTE: The following applies primarily to controlled substances (Opioid* Pain Medications).   Patient's responsibilities: Pain Pills: Bring all pain pills to every appointment (except for procedure appointments). Pill Bottles: Bring pills in original pharmacy bottle. Always bring newest bottle. Bring bottle, even if empty. Medication refills: You are responsible for knowing and keeping track of what medications you need refilled. The day before your appointment, write a list of all prescriptions that need to be refilled. Bring that list to your appointment and give it to the admitting nurse. Prescriptions will be written only during appointments. If you forget a medication, it will not be "Called in", "Faxed", or "electronically sent". You will need to get another appointment to get these prescribed. Prescription Accuracy: You are responsible for carefully inspecting your prescriptions before leaving our office. Have the discharge nurse carefully go over each prescription with you, before taking them home. Make sure that your name is accurately spelled, that your address is correct. Check the name and dose of your medication to make sure it is accurate. Check the number of pills, and the written instructions to make sure they are clear and accurate. Make sure that you are given enough medication to last until your next medication refill appointment. Taking Medication: Take medication as prescribed. Never  take more pills than instructed. Never take medication more frequently than prescribed. Taking less pills or less frequently is permitted and encouraged, when it comes to controlled substances (written prescriptions).  Inform other Doctors: Always inform, all of your healthcare providers, of all the medications you take. Pain Medication from other Providers: You are not allowed to accept any additional pain medication from any other Doctor or Healthcare provider. There are two exceptions to this rule. (see below) In the event that you require additional pain medication, you are responsible for notifying us, as stated below. Medication Agreement: You are responsible for carefully reading and following our Medication Agreement. This must be signed before receiving any prescriptions from our practice. Safely store a copy of your signed Agreement. Violations to the Agreement will result in no further prescriptions. (Additional copies of our Medication Agreement are available upon request.) Laws, Rules, & Regulations: All patients are expected to follow all Federal and State Laws, Statutes, Rules, & Regulations. Ignorance of the Laws does not constitute a valid excuse. The use of any illegal substances is prohibited. Adopted CDC guidelines & recommendations: Target dosing levels will be at or below 60 MME/day. Use of benzodiazepines** is not recommended.  Exceptions: There are only two exceptions to the rule of not receiving pain medications from other Healthcare Providers. Exception #1 (Emergencies): In the event of an emergency (i.e.: accident requiring emergency care), you are allowed to receive additional pain medication. However, you are responsible for: As soon as you are able, call our office [phone], at any time of the day or night, and leave a message stating your name, the date and nature of the emergency, and the name and dose of the medication prescribed. In the event that your call is answered  by a member of   our staff, make sure to document and save the date, time, and the name of the person that took your information.  Exception #2 (Planned Surgery): In the event that you are scheduled by another doctor or dentist to have any type of surgery or procedure, you are allowed (for a period no longer than 30 days), to receive additional pain medication, for the acute post-op pain. However, in this case, you are responsible for picking up a copy of our "Post-op Pain Management for Surgeons" handout, and giving it to your surgeon or dentist. This document is available at our office, and does not require an appointment to obtain it. Simply go to our office during business hours (Monday-Thursday from 8:00 AM to 4:00 PM) (Friday 8:00 AM to 12:00 Noon) or if you have a scheduled appointment with us, prior to your surgery, and ask for it by name. In addition, you will need to provide us with your name, name of your surgeon, type of surgery, and date of procedure or surgery.  *Opioid medications include: morphine, codeine, oxycodone, oxymorphone, hydrocodone, hydromorphone, meperidine, tramadol, tapentadol, buprenorphine, fentanyl, methadone. **Benzodiazepine medications include: diazepam (Valium), alprazolam (Xanax), clonazepam (Klonopine), lorazepam (Ativan), clorazepate (Tranxene), chlordiazepoxide (Librium), estazolam (Prosom), oxazepam (Serax), temazepam (Restoril), triazolam (Halcion) (Last updated: 03/08/2017)  

## 2023-04-10 NOTE — Progress Notes (Signed)
 Nursing Pain Medication Assessment:  Safety precautions to be maintained throughout the outpatient stay will include: orient to surroundings, keep bed in low position, maintain call bell within reach at all times, provide assistance with transfer out of bed and ambulation.   Medication Inspection Compliance: Pill count conducted under aseptic conditions, in front of the patient. Neither the pills nor the bottle was removed from the patient's sight at any time. Once count was completed pills were immediately returned to the patient in their original bottle.  Medication: Tramadol (Ultram) Pill/Patch Count:  5 of 120 pills remain (Patient state some of his pills feel in his sink while his sink was filled with water as he was shaving and accidentally knocked it out with his elbow. States he lost about 1/3 of the bottle with had accident).  Pill/Patch Appearance: Markings consistent with prescribed medication Bottle Appearance: Standard pharmacy container. Clearly labeled. Filled Date: 03 / 11 / 2025 Last Medication intake:  Today

## 2023-04-10 NOTE — Progress Notes (Addendum)
 PROVIDER NOTE: Information contained herein reflects review and annotations entered in association with encounter. Interpretation of such information and data should be left to medically-trained personnel. Information provided to patient can be located elsewhere in the medical record under "Patient Instructions". Document created using STT-dictation technology, any transcriptional errors that may result from process are unintentional.    Patient: Zachary Fernandez  Service Category: E/M  Provider: Edward Jolly, MD  DOB: 1940-10-13  DOS: 04/10/2023  Referring Provider: Mick Sell, MD  MRN: [id number]  Specialty: Interventional Pain Management  PCP: Mick Sell, MD  Type: Established Patient  Setting: Ambulatory outpatient    Location: Office  Delivery: Face-to-face     HPI  [name], a 83 y.o. 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 [name] primary complain today is Back Pain Last encounter: My last encounter with him was on 03/13/23 Pertinent problems: [name] has Chronic SI joint pain; Status post hip replacement, bilateral; History of bilateral knee replacement; Spinal stenosis, lumbar region, with neurogenic claudication; Chronic radicular lumbar pain; Lumbar spondylosis; Chronic pain syndrome; Pain management contract signed; Whiplash injury to neck; and Closed nondisplaced fracture of second cervical vertebra (HCC) on their pertinent problem list. Pain Assessment: Severity of Chronic pain is reported as a 2 /10. Location: Back Lower, Right, Left/across the lower back to right hip and right thigh. Onset: More than a month ago. Quality: Constant, Discomfort, Pounding, Stabbing. Timing: Constant. Modifying factor(s): standing up straight, if he bends over the pain is sharp. Vitals:  height is 5\' 6"  (1.676 m) and weight is 150 lb (68 kg). His temporal temperature is 97.8 F (36.6 C). His blood pressure is 153/77 (abnormal) and  his pulse is 92. His respiration is 16 and oxygen saturation is 100%.   Reason for encounter: medication management.  No change in medical history since last visit.  Patient's pain is at baseline.  Patient continues multimodal pain regimen as prescribed.  States that it provides pain relief and improvement in functional status.  He is planning to travel to the [place] and [place] in the next few days and needs to ensure his medication is available before departure. He will be leaving in two or three days and plans to pick up the medication tomorrow.  Of note,  his pill count is short. (Patient state some of his pills feel in his sink while his sink was filled with water as he was shaving and accidentally knocked it out with his elbow. States he lost about 1/3 of the bottle with had accident).   No new symptoms or issues were reported during this visit. There was no discussion of changes in his pain management or any other medical conditions.       ROS  Constitutional: Denies any fever or chills Gastrointestinal: No reported hemesis, hematochezia, vomiting, or acute GI distress Musculoskeletal:  Low back pain with radiation left hip and left and into right leg Neurological: No reported episodes of acute onset apraxia, aphasia, dysarthria, agnosia, amnesia, paralysis, loss of coordination, or loss of consciousness  Medication Review  Calcium Citrate, Lotilaner, Multiple Vitamins-Minerals, Turmeric, acetaminophen, desipramine, metoprolol tartrate, polyethylene glycol, rosuvastatin, thiamine, traMADol, traZODone, and vitamin B-12  History Review  Allergy: [name] has no known allergies. Drug: [name]  reports no history of drug use. Alcohol:  reports current alcohol use. Tobacco:  reports that he has never smoked. He has never used smokeless tobacco. Social: [name]  reports that he has never smoked. He has never used smokeless tobacco. He reports current alcohol use. He reports that he  does not use drugs. Medical:  has a past medical history of EtOH dependence (HCC), HLD (hyperlipidemia), and Hypertension. Surgical: [name]  has a past surgical history that includes Revision total hip arthroplasty (Bilateral); Knee Arthroplasty (Left); Shoulder surgery (Left); Cholecystectomy; and Hip Closed Reduction (Right, 12/12/2021). Family: family history includes Diabetes in his brother.  Laboratory Chemistry Profile   Renal Lab Results  Component Value Date   BUN 14 12/18/2022   CREATININE 0.77 12/18/2022   GFRNONAA >60 12/18/2022    Hepatic Lab Results  Component Value Date   AST 27 12/10/2022   ALT 21 12/10/2022   ALBUMIN 3.9 12/10/2022   ALKPHOS 92 12/10/2022    Electrolytes Lab Results  Component Value Date   NA 136 12/18/2022   K 3.8 12/18/2022   CL 100 12/18/2022   CALCIUM 9.2 12/18/2022   MG 1.9 12/16/2022   PHOS 3.9 12/16/2022    Bone Lab Results  Component Value Date   VD25OH 34.15 12/13/2022    Inflammation (CRP: Acute Phase) (ESR: Chronic Phase) Lab Results  Component Value Date   LATICACIDVEN 1.3 12/14/2022         Note: Above Lab results reviewed.  Recent Imaging Review  DG Thoracic Spine 2 View CLINICAL DATA:  Fracture.  Follow-up.  EXAM: THORACIC SPINE 2 VIEWS  COMPARISON:  12/18/2022.  FINDINGS: Unremarkable spinal curvature.  No spondylolisthesis.  There is subtle sclerosis along the superior endplate of T10 vertebral body without significant loss of height.  Remaining vertebral body heights are maintained.  No aggressive osseous lesion.  Mild to moderate multilevel degenerative changes in the form of reduced intervertebral disc height, endplate sclerosis/irregularity and marginal osteophyte formation.  Visualized soft tissues are within normal limits.  IMPRESSION: *Subtle sclerosis along the superior endplate of T10 vertebral body without significant loss of height of which may represent healing vertebral  fracture.  Electronically Signed   By: Jules Schick M.D.   On: 03/25/2023 17:37 Note: Reviewed        Physical Exam  General appearance: Well nourished, well developed, and well hydrated. In no apparent acute distress Mental status: Alert, oriented x 3 (person, place, & time)       Respiratory: No evidence of acute respiratory distress Eyes: PERLA Vitals: BP (!) 153/77 (Patient Position: Sitting, Cuff Size: Normal)   Pulse 92   Temp 97.8 F (36.6 C) (Temporal)   Resp 16   Ht 5\' 6"  (1.676 m)   Wt 150 lb (68 kg)   SpO2 100%   BMI 24.21 kg/m  BMI: Estimated body mass index is 24.21 kg/m as calculated from the following:   Height as of this encounter: 5\' 6"  (1.676 m).   Weight as of this encounter: 150 lb (68 kg). Ideal: Ideal body weight: 63.8 kg (140 lb 10.5 oz) Adjusted ideal body weight: 65.5 kg (144 lb 6.3 oz)  Lumbar Spine Area Exam  Skin & Axial Inspection: No masses, redness, or swelling Alignment: Symmetrical Functional ROM: Pain restricted ROM       Stability: No instability detected Muscle Tone/Strength: Functionally intact. No obvious neuro-muscular anomalies detected. Sensory (Neurological): Dermatomal pain pattern   Gait & Posture Assessment  Ambulation: Limited Gait: Antalgic gait (limping) Posture: Difficulty standing up straight, due to pain  Lower Extremity Exam    Side: Right lower extremity  Side: Left lower extremity  Stability: No  instability observed          Stability: No instability observed          Skin & Extremity Inspection: Skin color, temperature, and hair growth are WNL. No peripheral edema or cyanosis. No masses, redness, swelling, asymmetry, or associated skin lesions. No contractures.  Skin & Extremity Inspection: Skin color, temperature, and hair growth are WNL. No peripheral edema or cyanosis. No masses, redness, swelling, asymmetry, or associated skin lesions. No contractures.  Functional ROM: Pain restricted ROM. Positive straight leg  raise test on the right          Functional ROM: Unrestricted ROM                  Muscle Tone/Strength: Functionally intact. No obvious neuro-muscular anomalies detected.  Muscle Tone/Strength: Functionally intact. No obvious neuro-muscular anomalies detected.  Sensory (Neurological): Dermatomal pain pattern        Sensory (Neurological): Unimpaired        DTR: Patellar: deferred today Achilles: deferred today Plantar: deferred today  DTR: Patellar: deferred today Achilles: deferred today Plantar: deferred today  Palpation: No palpable anomalies  Palpation: No palpable anomalies    Assessment   Diagnosis  1. Chronic radicular lumbar pain   2. Cluneal neuropathy   3. Spinal stenosis, lumbar region, with neurogenic claudication   4. Lumbar spondylosis   5. Closed nondisplaced fracture of second cervical vertebra, unspecified fracture morphology, sequela   6. Bilateral occipital neuralgia   7. Chronic pain syndrome         Plan of Care   1. Chronic radicular lumbar pain (Primary) - traMADol (ULTRAM) 50 MG tablet; Take 1 tablet (50 mg total) by mouth every 6 (six) hours as needed for severe pain (pain score 7-10).  Dispense: 120 tablet; Refill: 2  2. Cluneal neuropathy  3. Spinal stenosis, lumbar region, with neurogenic claudication  4. Lumbar spondylosis  5. Closed nondisplaced fracture of second cervical vertebra, unspecified fracture morphology, sequela  6. Bilateral occipital neuralgia  7. Chronic pain syndrome - traMADol (ULTRAM) 50 MG tablet; Take 1 tablet (50 mg total) by mouth every 6 (six) hours as needed for severe pain (pain score 7-10).  Dispense: 120 tablet; Refill: 2   Assessment and Plan    Chronic Pain Management   He requires ongoing management of chronic pain, currently managed with tramadol. There is an issue with the medication refill that needs resolution to ensure continuity of care, especially as he is traveling soon. Send the tramadol  prescription to the pharmacy today. Instruct him to pick up the medication tomorrow and advise him to inform the pharmacy to contact the clinic if there is any issue with the prescription refill.  Follow-up   He is scheduled for a follow-up appointment in three months to reassess his condition and medication needs. Schedule the follow-up appointment in three months.        Orders:  No orders of the defined types were placed in this encounter.  Follow-up plan:   Return in about 3 months (around 07/10/2023) for MM, F2F.     Interventional management options:  Considering: Occipital nerve block   C2-C3 medial branch nerve block Lumbar epidural steroid injection Lumbar facet medial branch nerve blocks S1-S3 lateral branch nerve blocks SI joint injection Sprint peripheral nerve stimulation   PRN Procedures:   None at this time       Recent Visits Date Type Provider Dept  03/13/23 Office Visit Edward Jolly, MD [facility]  01/18/23 Office Visit Edward Jolly, MD [facility]  Showing recent visits within past 90 days and meeting all other requirements Today's Visits Date Type Provider Dept  04/10/23 Office Visit Edward Jolly, MD [facility]  Showing today's visits and meeting all other requirements Future Appointments Date Type Provider Dept  07/05/23 Appointment Edward Jolly, MD [facility]  Showing future appointments within next 90 days and meeting all other requirements  I discussed the assessment and treatment plan with the patient. The patient was provided an opportunity to ask questions and all were answered. The patient agreed with the plan and demonstrated an understanding of the instructions.  Patient advised to call back or seek an in-person evaluation if the symptoms or condition worsens.  Duration of encounter: .  Total time on encounter, as per AMA guidelines included both the face-to-face and non-face-to-face  time personally spent by the physician and/or other qualified health care professional(s) on the day of the encounter (includes time in activities that require the physician or other qualified health care professional and does not include time in activities normally performed by clinical staff). Physician's time may include the following activities when performed: preparing to see the patient (eg, review of tests, pre-charting review of records) obtaining and/or reviewing separately obtained history performing a medically appropriate examination and/or evaluation counseling and educating the patient/family/caregiver ordering medications, tests, or procedures referring and communicating with other health care professionals (when not separately reported) documenting clinical information in the electronic or other health record independently interpreting results (not separately reported) and communicating results to the patient/ family/caregiver care coordination (not separately reported)  Note by: Edward Jolly, MD Date: 04/10/2023; Time: 3:23 PM

## 2023-04-11 ENCOUNTER — Telehealth: Payer: Self-pay | Admitting: Student in an Organized Health Care Education/Training Program

## 2023-04-11 NOTE — Telephone Encounter (Signed)
 PT called stated that when he call to [facility] to get prescribed refill on tramadol. PT was told they need a approve from [facility] to pick up prescription early. PT was in office on yesterday. Please give patient a call. TY

## 2023-04-11 NOTE — Telephone Encounter (Signed)
 Called pharm and made them aware that ok to fill medication and MD aware of patients situation, dropping in water.

## 2023-04-11 NOTE — Telephone Encounter (Signed)
 Patient able to pick up medication 04/10/23 as ordered.

## 2023-04-12 ENCOUNTER — Encounter: Admitting: Student in an Organized Health Care Education/Training Program

## 2023-06-12 ENCOUNTER — Ambulatory Visit
Attending: Student in an Organized Health Care Education/Training Program | Admitting: Student in an Organized Health Care Education/Training Program

## 2023-06-12 ENCOUNTER — Encounter: Payer: Self-pay | Admitting: Student in an Organized Health Care Education/Training Program

## 2023-06-12 VITALS — BP 147/77 | HR 82 | Resp 16 | Ht 66.0 in | Wt 152.0 lb

## 2023-06-12 DIAGNOSIS — M5416 Radiculopathy, lumbar region: Secondary | ICD-10-CM | POA: Insufficient documentation

## 2023-06-12 DIAGNOSIS — M48062 Spinal stenosis, lumbar region with neurogenic claudication: Secondary | ICD-10-CM | POA: Diagnosis present

## 2023-06-12 DIAGNOSIS — G8929 Other chronic pain: Secondary | ICD-10-CM | POA: Insufficient documentation

## 2023-06-12 DIAGNOSIS — G894 Chronic pain syndrome: Secondary | ICD-10-CM | POA: Diagnosis not present

## 2023-06-12 MED ORDER — TRAMADOL HCL 50 MG PO TABS: 50.0000 mg | ORAL_TABLET | Freq: Four times a day (QID) | ORAL | 2 refills | Status: DC | PRN

## 2023-06-12 NOTE — Patient Instructions (Signed)

## 2023-06-12 NOTE — Progress Notes (Signed)
 PROVIDER NOTE: Information contained herein reflects review and annotations entered in association with encounter. Interpretation of such information and data should be left to medically-trained personnel. Information provided to patient can be located elsewhere in the medical record under "Patient Instructions". Document created using STT-dictation technology, any transcriptional errors that may result from process are unintentional.    Patient: Zachary Fernandez  Service Category: E/M  Provider: Cephus Collin, MD  DOB: [birth year]  DOS: 06/12/2023  Referring Provider: Eartha Gold, MD  MRN: [id number]  Specialty: Interventional Pain Management  PCP: Eartha Gold, MD  Type: Established Patient  Setting: Ambulatory outpatient    Location: Office  Delivery: Face-to-face     HPI  [name], a 83 y.o. 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 Mr. Schwenke primary complain today is Back Pain (Left low) Last encounter: My last encounter with him was on 04/10/23 Pertinent problems: Mr. Varelas has Chronic SI joint pain; Status post hip replacement, bilateral; History of bilateral knee replacement; Spinal stenosis, lumbar region, with neurogenic claudication; Chronic radicular lumbar pain; Lumbar spondylosis; Chronic pain syndrome; Pain management contract signed; Whiplash injury to neck; and Closed nondisplaced fracture of second cervical vertebra (HCC) on their pertinent problem list. Pain Assessment: Severity of Chronic pain is reported as a 6 /10. Location: Back Lower, Left/sometimes. Onset: More than a month ago. Quality: Stabbing. Timing: Intermittent. Modifying factor(s): not bending. Vitals:  height is 5\' 6"  (1.676 m) and weight is 152 lb (68.9 kg). His blood pressure is 147/77 (abnormal) and his pulse is 82. His respiration is 16 and oxygen saturation is 100%.   Reason for encounter: medication management.  Patient experiencing increased  low back pain with radiates into left leg.  He previously received a caudal epidural injection over 1 year ago that provided him with pain relief, 50% for approximately 3 and half months.  He is interested in repeating this.   Pharmacotherapy Assessment  Analgesic:   Tramadol  50-100 mg BID prn   Monitoring: [place] PMP: PDMP reviewed during this encounter.       Pharmacotherapy: No side-effects or adverse reactions reported. Compliance: No problems identified. Effectiveness: Clinically acceptable.  UDS:  Summary  Date Value Ref Range Status  02/16/2020 Note  Final    Comment:    ==================================================================== Compliance Drug Analysis, Ur ==================================================================== Test                             Result       Flag       Units  Drug Present and Declared for Prescription Verification   Tramadol                        >3247        EXPECTED   ng/mg creat   O-Desmethyltramadol            >3247        EXPECTED   ng/mg creat   N-Desmethyltramadol            964          EXPECTED   ng/mg creat    Source of tramadol  is a prescription medication. O-desmethyltramadol    and N-desmethyltramadol are expected metabolites of tramadol .    Desipramine                     PRESENT  EXPECTED    Desipramine  may be administered as a prescription drug; it is also    an expected metabolite of imipramine.  Drug Absent but Declared for Prescription Verification   Gabapentin                      Not Detected UNEXPECTED ==================================================================== Test                      Result    Flag   Units      Ref Range   Creatinine              154              mg/dL      >=16 ==================================================================== Declared Medications:  The flagging and interpretation on this report are based on the  following declared medications.  Unexpected results may arise  from  inaccuracies in the declared medications.   **Note: The testing scope of this panel includes these medications:   Desipramine  (Norpramin )  Gabapentin  (Neurontin )  Tramadol  (Ultram )   **Note: The testing scope of this panel does not include the  following reported medications:   Amlodipine  (Norvasc )  Bisoprolol   Calcium   Hydrochlorothiazide   Multivitamin  Rosuvastatin  (Crestor )  Supplement  Tamsulosin  (Flomax )  Testosterone  Turmeric ==================================================================== For clinical consultation, please call [phone]. ====================================================================       ROS  Constitutional: Denies any fever or chills Gastrointestinal: No reported hemesis, hematochezia, vomiting, or acute GI distress Musculoskeletal: Low back pain with radiation left hip and left and into right leg Neurological: No reported episodes of acute onset apraxia, aphasia, dysarthria, agnosia, amnesia, paralysis, loss of coordination, or loss of consciousness  Medication Review  Calcium  Citrate, Lotilaner, Multiple Vitamins-Minerals, Turmeric, acetaminophen , desipramine , metoprolol  tartrate, polyethylene glycol, rosuvastatin , thiamine , traMADol , traZODone , and vitamin B-12  History Review  Allergy: Mr. Bennis is allergic to other. Drug: Mr. Goodin  reports no history of drug use. Alcohol:  reports current alcohol use. Tobacco:  reports that he has never smoked. He has never used smokeless tobacco. Social: Mr. Stoneking  reports that he has never smoked. He has never used smokeless tobacco. He reports current alcohol use. He reports that he does not use drugs. Medical:  has a past medical history of EtOH dependence (HCC), HLD (hyperlipidemia), and Hypertension. Surgical: Mr. Kiang  has a past surgical history that includes Revision total hip arthroplasty (Bilateral); Knee Arthroplasty (Left); Shoulder surgery (Left); Cholecystectomy; and  Hip Closed Reduction (Right, 12/12/2021). Family: family history includes Diabetes in his brother.  Laboratory Chemistry Profile   Renal Lab Results  Component Value Date   BUN 14 12/18/2022   CREATININE 0.77 12/18/2022   GFRNONAA >60 12/18/2022    Hepatic Lab Results  Component Value Date   AST 27 12/10/2022   ALT 21 12/10/2022   ALBUMIN 3.9 12/10/2022   ALKPHOS 92 12/10/2022    Electrolytes Lab Results  Component Value Date   NA 136 12/18/2022   K 3.8 12/18/2022   CL 100 12/18/2022   CALCIUM  9.2 12/18/2022   MG 1.9 12/16/2022   PHOS 3.9 12/16/2022    Bone Lab Results  Component Value Date   VD25OH 34.15 12/13/2022    Inflammation (CRP: Acute Phase) (ESR: Chronic Phase) Lab Results  Component Value Date   LATICACIDVEN 1.3 12/14/2022         Note: Above Lab results reviewed.  Recent Imaging Review  DG Thoracic Spine 2 View CLINICAL DATA:  Fracture.  Follow-up.  EXAM: THORACIC SPINE 2 VIEWS  COMPARISON:  12/18/2022.  FINDINGS: Unremarkable spinal curvature.  No spondylolisthesis.  There is subtle sclerosis along the superior endplate of T10 vertebral body without significant loss of height.  Remaining vertebral body heights are maintained.  No aggressive osseous lesion.  Mild to moderate multilevel degenerative changes in the form of reduced intervertebral disc height, endplate sclerosis/irregularity and marginal osteophyte formation.  Visualized soft tissues are within normal limits.  IMPRESSION: *Subtle sclerosis along the superior endplate of T10 vertebral body without significant loss of height of which may represent healing vertebral fracture.  Electronically Signed   By: Beula Brunswick M.D.   On: 03/25/2023 17:37 Note: Reviewed        Physical Exam  General appearance: Well nourished, well developed, and well hydrated. In no apparent acute distress Mental status: Alert, oriented x 3 (person, place, & time)       Respiratory: No  evidence of acute respiratory distress Eyes: PERLA Vitals: BP (!) 147/77   Pulse 82   Resp 16   Ht 5\' 6"  (1.676 m)   Wt 152 lb (68.9 kg)   SpO2 100%   BMI 24.53 kg/m  BMI: Estimated body mass index is 24.53 kg/m as calculated from the following:   Height as of this encounter: 5\' 6"  (1.676 m).   Weight as of this encounter: 152 lb (68.9 kg). Ideal: Ideal body weight: 63.8 kg (140 lb 10.5 oz) Adjusted ideal body weight: 65.9 kg (145 lb 3.1 oz)  Lumbar Spine Area Exam  Skin & Axial Inspection: No masses, redness, or swelling Alignment: Symmetrical Functional ROM: Pain restricted ROM       Stability: No instability detected Muscle Tone/Strength: Functionally intact. No obvious neuro-muscular anomalies detected. Sensory (Neurological): Dermatomal pain pattern   Gait & Posture Assessment  Ambulation: Limited Gait: Antalgic gait (limping) Posture: Difficulty standing up straight, due to pain  Lower Extremity Exam    Side: Right lower extremity  Side: Left lower extremity  Stability: No instability observed          Stability: No instability observed          Skin & Extremity Inspection: Skin color, temperature, and hair growth are WNL. No peripheral edema or cyanosis. No masses, redness, swelling, asymmetry, or associated skin lesions. No contractures.  Skin & Extremity Inspection: Skin color, temperature, and hair growth are WNL. No peripheral edema or cyanosis. No masses, redness, swelling, asymmetry, or associated skin lesions. No contractures.  Functional ROM: Pain restricted ROM. Positive straight leg raise test on the right          Functional ROM: Unrestricted ROM                  Muscle Tone/Strength: Functionally intact. No obvious neuro-muscular anomalies detected.  Muscle Tone/Strength: Functionally intact. No obvious neuro-muscular anomalies detected.  Sensory (Neurological): Dermatomal pain pattern        Sensory (Neurological): Unimpaired        DTR: Patellar: deferred  today Achilles: deferred today Plantar: deferred today  DTR: Patellar: deferred today Achilles: deferred today Plantar: deferred today  Palpation: No palpable anomalies  Palpation: No palpable anomalies    Assessment   Diagnosis  1. Chronic radicular lumbar pain   2. Spinal stenosis, lumbar region, with neurogenic claudication   3. Chronic pain syndrome        Plan of Care   1. Chronic radicular lumbar pain (Primary) - Caudal Epidural Injection; Future -  traMADol  (ULTRAM ) 50 MG tablet; Take 1 tablet (50 mg total) by mouth every 6 (six) hours as needed for severe pain (pain score 7-10).  Dispense: 120 tablet; Refill: 2  2. Spinal stenosis, lumbar region, with neurogenic claudication - Caudal Epidural Injection; Future  3. Chronic pain syndrome - Caudal Epidural Injection; Future - traMADol  (ULTRAM ) 50 MG tablet; Take 1 tablet (50 mg total) by mouth every 6 (six) hours as needed for severe pain (pain score 7-10).  Dispense: 120 tablet; Refill: 2    Orders:  Orders Placed This Encounter  Procedures   Caudal Epidural Injection    Standing Status:   Future    Expected Date:   06/13/2023    Expiration Date:   09/12/2023    Scheduling Instructions:     Laterality: Midline     Level(s): Sacrococcygeal canal (Tailbone area)     Sedation: Patient's choice     Scheduling Timeframe: As soon as pre-approved    Where will this procedure be performed?:   [facility]   Follow-up plan:   Return in about 1 day (around 06/13/2023) for caudal ESI, in clinic [facility].     Interventional management options:  Considering: Occipital nerve block   C2-C3 medial branch nerve block Lumbar epidural steroid injection Lumbar facet medial branch nerve blocks S1-S3 lateral branch nerve blocks SI joint injection Sprint peripheral nerve stimulation   PRN Procedures:   None at this time       Recent Visits Date Type Provider Dept  04/10/23 Office Visit Cephus Collin, MD [facility]  Showing recent visits within past 90 days and meeting all other requirements Today's Visits Date Type Provider Dept  06/12/23 Office Visit Cephus Collin, MD [facility]  Showing today's visits and meeting all other requirements Future Appointments Date Type Provider Dept  06/13/23 Appointment Cephus Collin, MD [facility]  07/05/23 Appointment Cephus Collin, MD [facility]  Showing future appointments within next 90 days and meeting all other requirements  I discussed the assessment and treatment plan with the patient. The patient was provided an opportunity to ask questions and all were answered. The patient agreed with the plan and demonstrated an understanding of the instructions.  Patient advised to call back or seek an in-person evaluation if the symptoms or condition worsens.  Duration of encounter: .  Total time on encounter, as per AMA guidelines included both the face-to-face and non-face-to-face time personally spent by the physician and/or other qualified health care professional(s) on the day of the encounter (includes time in activities that require the physician or other qualified health care professional and does not include time in activities normally performed by clinical staff). Physician's time may include the following activities when performed: preparing to see the patient (eg, review of tests, pre-charting review of records) obtaining and/or reviewing separately obtained history performing a medically appropriate examination and/or evaluation counseling and educating the patient/family/caregiver ordering medications, tests, or procedures referring and communicating with other health care professionals (when not separately reported) documenting clinical information in the electronic or other health record independently interpreting results (not separately reported) and communicating results to the patient/  family/caregiver care coordination (not separately reported)  Note by: Cephus Collin, MD Date: 06/12/2023; Time: 9:54 AM

## 2023-06-12 NOTE — Progress Notes (Signed)
 Safety precautions to be maintained throughout the outpatient stay will include: orient to surroundings, keep bed in low position, maintain call bell within reach at all times, provide assistance with transfer out of bed and ambulation.

## 2023-06-13 ENCOUNTER — Ambulatory Visit
Attending: Student in an Organized Health Care Education/Training Program | Admitting: Student in an Organized Health Care Education/Training Program

## 2023-06-13 ENCOUNTER — Encounter: Payer: Self-pay | Admitting: Student in an Organized Health Care Education/Training Program

## 2023-06-13 ENCOUNTER — Ambulatory Visit
Admission: RE | Admit: 2023-06-13 | Discharge: 2023-06-13 | Disposition: A | Discharge status: Home / Self Care | Source: Ambulatory Visit | Attending: Student in an Organized Health Care Education/Training Program | Admitting: Student in an Organized Health Care Education/Training Program

## 2023-06-13 VITALS — BP 142/72 | HR 76 | Temp 98.6°F | Resp 17 | Ht 66.0 in | Wt 152.0 lb

## 2023-06-13 DIAGNOSIS — M48062 Spinal stenosis, lumbar region with neurogenic claudication: Secondary | ICD-10-CM | POA: Diagnosis present

## 2023-06-13 DIAGNOSIS — M5416 Radiculopathy, lumbar region: Secondary | ICD-10-CM | POA: Insufficient documentation

## 2023-06-13 DIAGNOSIS — G894 Chronic pain syndrome: Secondary | ICD-10-CM | POA: Insufficient documentation

## 2023-06-13 DIAGNOSIS — G8929 Other chronic pain: Secondary | ICD-10-CM | POA: Diagnosis present

## 2023-06-13 MED ORDER — TRIAMCINOLONE ACETONIDE 40 MG/ML IJ SUSP: 40.0000 mg | Freq: Once | INTRAMUSCULAR | Status: DC

## 2023-06-13 MED ORDER — DEXAMETHASONE SODIUM PHOSPHATE 10 MG/ML IJ SOLN
10.0000 mg | Freq: Once | INTRAMUSCULAR | Status: AC
  Administered 2023-06-13: 10 mg

## 2023-06-13 MED ORDER — IOHEXOL 180 MG/ML  SOLN
INTRAMUSCULAR | Status: AC
Start: 2023-06-13 — End: ?
  Filled 2023-06-13: qty 20

## 2023-06-13 MED ORDER — LIDOCAINE HCL 2 % IJ SOLN
20.0000 mL | Freq: Once | INTRAMUSCULAR | Status: AC
  Administered 2023-06-13: 400 mg

## 2023-06-13 MED ORDER — ROPIVACAINE HCL 2 MG/ML IJ SOLN
2.0000 mL | Freq: Once | INTRAMUSCULAR | Status: AC
  Administered 2023-06-13: 2 mL via EPIDURAL

## 2023-06-13 MED ORDER — SODIUM CHLORIDE (PF) 0.9 % IJ SOLN
INTRAMUSCULAR | Status: AC
  Filled 2023-06-13: qty 10

## 2023-06-13 MED ORDER — IOHEXOL 180 MG/ML  SOLN
10.0000 mL | Freq: Once | INTRAMUSCULAR | Status: AC
  Administered 2023-06-13: 10 mL via EPIDURAL

## 2023-06-13 MED ORDER — DEXAMETHASONE SODIUM PHOSPHATE 10 MG/ML IJ SOLN
INTRAMUSCULAR | Status: AC
  Filled 2023-06-13: qty 1

## 2023-06-13 MED ORDER — LIDOCAINE HCL 2 % IJ SOLN
INTRAMUSCULAR | Status: AC
  Filled 2023-06-13: qty 20

## 2023-06-13 MED ORDER — ROPIVACAINE HCL 2 MG/ML IJ SOLN
INTRAMUSCULAR | Status: AC
  Filled 2023-06-13: qty 20

## 2023-06-13 MED ORDER — SODIUM CHLORIDE 0.9% FLUSH
2.0000 mL | Freq: Once | INTRAVENOUS | Status: AC
  Administered 2023-06-13: 2 mL

## 2023-06-13 NOTE — Progress Notes (Signed)
 PROVIDER NOTE: Interpretation of information contained herein should be left to medically-trained personnel. Specific patient instructions are provided elsewhere under "Patient Instructions" section of medical record. This document was created in part using STT-dictation technology, any transcriptional errors that may result from this process are unintentional.  Patient: Zachary Fernandez Type: Established DOB: 1940-07-07 MRN: [id number] PCP: Eartha Gold, MD  Service: Procedure DOS: 06/13/2023 Setting: Ambulatory Location: Ambulatory outpatient facility Delivery: Face-to-face Provider: Cephus Collin, MD Specialty: Interventional Pain Management Specialty designation: 09 Location: Outpatient facility Ref. Prov.: Eartha Gold, MD    Interventional Therapy      Procedure: Caudal Epidural Steroid Injection        Laterality: Midline         Level: Sacrococcygeal ligament  Imaging: Fluoroscopy-guided         Anesthesia: Local anesthesia (1-2% Lidocaine ) Anxiolysis: None                 Sedation:                .  DOS: 06/13/2023  Performed by: Cephus Collin, MD  Purpose: Diagnostic/Therapeutic Indications: Low back and lower extremity pain severe enough to impact quality of life or function. Rationale (medical necessity): procedure needed and proper for the diagnosis and/or treatment of Zachary Fernandez medical symptoms and needs. 1. Chronic radicular lumbar pain   2. Spinal stenosis, lumbar region, with neurogenic claudication   3. Chronic pain syndrome    NAS-11 Pain score:   Pre-procedure: 2 /10   Post-procedure: 2 /10     Target: Lumbosacral epidural canal  Location: Epidural space  Region: Caudal canal Approach: Percutaneous  Type of procedure: Epidural block  Position  Prep  Materials:  Position: Prone  Prep solution: DuraPrep (Iodine Povacrylex [0.7% available iodine] and Isopropyl Alcohol, 74% w/w) Prep Area: Entire posterior lumbosacral area  Materials:  Tray:  Epidural Needle(s):  Type: Epidural  Gauge (G): 22g Length: 3.5-in  Qty: 1  Pre-op H&P Assessment:  Zachary Fernandez is a 83 y.o. (year old), male patient, seen today for interventional treatment. He  has a past surgical history that includes Revision total hip arthroplasty (Bilateral); Knee Arthroplasty (Left); Shoulder surgery (Left); Cholecystectomy; and Hip Closed Reduction (Right, 12/12/2021). Zachary Fernandez has a current medication list which includes the following prescription(s): acetaminophen , calcium  citrate, desipramine , metoprolol  tartrate, multiple vitamins-minerals, polyethylene glycol, rosuvastatin , thiamine , [START ON 06/18/2023] tramadol , trazodone , turmeric, vitamin b-12, and xdemvy. His primarily concern today is the Back Pain  Initial Vital Signs:  Pulse/HCG Rate: 76ECG Heart Rate: 71 Temp: 98.6 F (37 C) Resp: 16 BP: (!) 145/71 SpO2: 99 %  BMI: Estimated body mass index is 24.53 kg/m as calculated from the following:   Height as of this encounter: 5\' 6"  (1.676 m).   Weight as of this encounter: 152 lb (68.9 kg).  Risk Assessment: Allergies: Reviewed. He is allergic to other.  Allergy Precautions: None required Coagulopathies: Reviewed. None identified.  Blood-thinner therapy: None at this time Active Infection(s): Reviewed. None identified. Zachary Fernandez is afebrile  Site Confirmation: Zachary Fernandez was asked to confirm the procedure and laterality before marking the site Procedure checklist: Completed Consent: Before the procedure and under the influence of no sedative(s), amnesic(s), or anxiolytics, the patient was informed of the treatment options, risks and possible complications. To fulfill our ethical and legal obligations, as recommended by the American Medical Association's Code of Ethics, I have informed the patient of my clinical impression; the nature and purpose of the treatment or  procedure; the risks, benefits, and possible complications of the intervention; the  alternatives, including doing nothing; the risk(s) and benefit(s) of the alternative treatment(s) or procedure(s); and the risk(s) and benefit(s) of doing nothing. The patient was provided information about the general risks and possible complications associated with the procedure. These may include, but are not limited to: failure to achieve desired goals, infection, bleeding, organ or nerve damage, allergic reactions, paralysis, and death. In addition, the patient was informed of those risks and complications associated to Spine-related procedures, such as failure to decrease pain; infection (i.e.: Meningitis, epidural or intraspinal abscess); bleeding (i.e.: epidural hematoma, subarachnoid hemorrhage, or any other type of intraspinal or peri-dural bleeding); organ or nerve damage (i.e.: Any type of peripheral nerve, nerve root, or spinal cord injury) with subsequent damage to sensory, motor, and/or autonomic systems, resulting in permanent pain, numbness, and/or weakness of one or several areas of the body; allergic reactions; (i.e.: anaphylactic reaction); and/or death. Furthermore, the patient was informed of those risks and complications associated with the medications. These include, but are not limited to: allergic reactions (i.e.: anaphylactic or anaphylactoid reaction(s)); adrenal axis suppression; blood sugar elevation that in diabetics may result in ketoacidosis or comma; water retention that in patients with history of congestive heart failure may result in shortness of breath, pulmonary edema, and decompensation with resultant heart failure; weight gain; swelling or edema; medication-induced neural toxicity; particulate matter embolism and blood vessel occlusion with resultant organ, and/or nervous system infarction; and/or aseptic necrosis of one or more joints. Finally, the patient was informed that Medicine is not an exact science; therefore, there is also the possibility of unforeseen or  unpredictable risks and/or possible complications that may result in a catastrophic outcome. The patient indicated having understood very clearly. We have given the patient no guarantees and we have made no promises. Enough time was given to the patient to ask questions, all of which were answered to the patient's satisfaction. Zachary Fernandez has indicated that he wanted to continue with the procedure. Attestation: I, the ordering provider, attest that I have discussed with the patient the benefits, risks, side-effects, alternatives, likelihood of achieving goals, and potential problems during recovery for the procedure that I have provided informed consent. Date  Time: 06/13/2023  2:27 PM  Imaging Guidance (Spinal):          Type of Imaging Technique: Fluoroscopy Guidance (Spinal) Indication(s): Assistance in needle guidance and placement for procedures requiring needle placement in or near specific anatomical locations not easily accessible without such assistance. Exposure Time: Please see nurses notes. Contrast: Before injecting any contrast, we confirmed that the patient did not have an allergy to iodine, shellfish, or radiological contrast. Once satisfactory needle placement was completed at the desired level, radiological contrast was injected. Contrast injected under live fluoroscopy. No contrast complications. See chart for type and volume of contrast used. Fluoroscopic Guidance: I was personally present during the use of fluoroscopy. "Tunnel Vision Technique" used to obtain the best possible view of the target area. Parallax error corrected before commencing the procedure. "Direction-depth-direction" technique used to introduce the needle under continuous pulsed fluoroscopy. Once target was reached, antero-posterior, oblique, and lateral fluoroscopic projection used confirm needle placement in all planes. Images permanently stored in EMR. Interpretation: I personally interpreted the imaging  intraoperatively. Adequate needle placement confirmed in multiple planes. Appropriate spread of contrast into desired area was observed. No evidence of afferent or efferent intravascular uptake. No intrathecal or subarachnoid spread observed. Permanent images saved into the  patient's record.  Pre-Procedure Preparation:  Monitoring: As per clinic protocol. Respiration, ETCO2, SpO2, BP, heart rate and rhythm monitor placed and checked for adequate function Safety Precautions: Patient was assessed for positional comfort and pressure points before starting the procedure. Time-out: I initiated and conducted the "Time-out" before starting the procedure, as per protocol. The patient was asked to participate by confirming the accuracy of the "Time Out" information. Verification of the correct person, site, and procedure were performed and confirmed by me, the nursing staff, and the patient. "Time-out" conducted as per Joint Commission's Universal Protocol (UP.01.01.01). Time: 1451  Description  Narrative of Procedure:          Procedural Technique Safety Precautions: Aspiration looking for blood return was conducted prior to all injections. At no point did we inject any substances, as a needle was being advanced. No attempts were made at seeking any paresthesias. Safe injection practices and needle disposal techniques used. Medications properly checked for expiration dates. SDV (single dose vial) medications used. Description of the Procedure: Protocol guidelines were followed. The patient was assisted into a comfortable position. The target area was identified and the area prepped in the usual manner. Skin & deeper tissues infiltrated with local anesthetic. Appropriate amount of time allowed to pass for local anesthetics to take effect. The procedure needles were then advanced to the target area. Proper needle placement secured. Negative aspiration confirmed. Solution injected in intermittent fashion, asking for  systemic symptoms every 0.5cc of injectate. The needles were then removed and the area cleansed, making sure to leave some of the prepping solution back to take advantage of its long term bactericidal properties.  Technical description of procedure:  Safety Precautions: Aspiration looking for blood return was conducted prior to all injections. At no point did we inject any substances, as a needle was being advanced. No attempts were made at seeking any paresthesias. Safe injection practices and needle disposal techniques used. Medications properly checked for expiration dates. SDV (single dose vial) medications used. Description of the Procedure: Protocol guidelines were followed. The patient was placed in position over the fluoroscopy table. The target area was identified and the area prepped in the usual manner. Skin & deeper tissues infiltrated with local anesthetic. Appropriate amount of time allowed to pass for local anesthetics to take effect. The procedure needles were then advanced to the target area. Proper needle placement secured. Negative aspiration confirmed. Solution injected in intermittent fashion, asking for systemic symptoms every 0.5cc of injectate. The needles were then removed and the area cleansed, making sure to leave some of the prepping solution back to take advantage of its long term bactericidal properties.  5 cc solution made of 2cc of preservative-free saline, 2 cc of 0.2% ropivacaine , 1 cc of Decadron  10 mg/cc.   Vitals:   06/13/23 1428 06/13/23 1449 06/13/23 1454  BP: (!) 145/71 (!) 148/79 (!) 142/72  Pulse: 76    Resp: 16 18 17   Temp: 98.6 F (37 C)    TempSrc: Temporal    SpO2: 99% 98% 97%  Weight: 152 lb (68.9 kg)    Height: 5\' 6"  (1.676 m)       Start Time: 1451 hrs. End Time: 1453 hrs.  Post-operative Assessment:  Post-procedure Vital Signs:  Pulse/HCG Rate: 7670 Temp: 98.6 F (37 C) Resp:  17 BP: (!) 142/72 SpO2: 97 %  EBL:  None  Complications: No immediate post-treatment complications observed by team, or reported by patient.  Note: The patient tolerated the entire procedure well.  A repeat set of vitals were taken after the procedure and the patient was kept under observation following institutional policy, for this type of procedure. Post-procedural neurological assessment was performed, showing return to baseline, prior to discharge. The patient was provided with post-procedure discharge instructions, including a section on how to identify potential problems. Should any problems arise concerning this procedure, the patient was given instructions to immediately contact us , at any time, without hesitation. In any case, we plan to contact the patient by telephone for a follow-up status report regarding this interventional procedure.  Comments:  No additional relevant information.  Plan of Care   5 out of 5 strength bilateral lower extremity: Plantar flexion, dorsiflexion, knee flexion, knee extension.   Orders:  Orders Placed This Encounter  Procedures   DG PAIN CLIN[facility] C-ARM 1-60 MIN NO REPORT    Intraoperative interpretation by procedural physician at [facility].    Standing Status:   Standing    Number of Occurrences:   1    Reason for exam::   Assistance in needle guidance and placement for procedures requiring needle placement in or near specific anatomical locations not easily accessible without such assistance.   Chronic Opioid Analgesic:  Tramadol  50-100 mg TID prn   Medications ordered for procedure: Meds ordered this encounter  Medications   iohexol  (OMNIPAQUE ) 180 MG/ML injection 10 mL    Must be Myelogram-compatible. If not available, you may substitute with a water-soluble, non-ionic, hypoallergenic, myelogram-compatible radiological contrast medium.   ropivacaine  (PF) 2 mg/mL (0.2%) (NAROPIN ) injection 2 mL   sodium chloride  flush (NS) 0.9 % injection 2 mL   DISCONTD:  triamcinolone acetonide (KENALOG-40) injection 40 mg   lidocaine  (XYLOCAINE ) 2 % (with pres) injection 400 mg   dexamethasone  (DECADRON ) injection 10 mg   Medications administered: We administered iohexol , ropivacaine  (PF) 2 mg/mL (0.2%), sodium chloride  flush, lidocaine , and dexamethasone .  See the medical record for exact dosing, route, and time of administration.  Follow-up plan:   Return in about 3 months (around 09/13/2023) for with [facility] for MM- no PPE needed. Please cancel 07/05/2023 appt with Dr. Rhesa Celeste..       Interventional management options:  Considering: Occipital nerve block   C2-C3 medial branch nerve block Lumbar epidural steroid injection Lumbar facet medial branch nerve blocks S1-S3 lateral branch nerve blocks SI joint injection Sprint peripheral nerve stimulation   PRN Procedures:   None at this time        Recent Visits Date Type Provider Dept  06/12/23 Office Visit Cephus Collin, MD [facility]  04/10/23 Office Visit Cephus Collin, MD [facility]  Showing recent visits within past 90 days and meeting all other requirements Today's Visits Date Type Provider Dept  06/13/23 Procedure visit Cephus Collin, MD [facility]  Showing today's visits and meeting all other requirements Future Appointments No visits were found meeting these conditions. Showing future appointments within next 90 days and meeting all other requirements  Disposition: Discharge home  Discharge (Date  Time): 06/13/2023; 1505 hrs.   Primary Care Physician: Eartha Gold, MD Location: [facility] Note by: Cephus Collin, MD Date: 06/13/2023; Time: 3:02 PM  Disclaimer:  Medicine is not an exact science. The only guarantee in medicine is that nothing is guaranteed. It is important to note that the decision to proceed with this intervention was based on the information collected from the patient. The Data and conclusions were  drawn from the patient's questionnaire, the interview,  and the physical examination. Because the information was provided in large part by the patient, it cannot be guaranteed that it has not been purposely or unconsciously manipulated. Every effort has been made to obtain as much relevant data as possible for this evaluation. It is important to note that the conclusions that lead to this procedure are derived in large part from the available data. Always take into account that the treatment will also be dependent on availability of resources and existing treatment guidelines, considered by other Pain Management Practitioners as being common knowledge and practice, at the time of the intervention. For Medico-Legal purposes, it is also important to point out that variation in procedural techniques and pharmacological choices are the acceptable norm. The indications, contraindications, technique, and results of the above procedure should only be interpreted and judged by a Board-Certified Interventional Pain Specialist with extensive familiarity and expertise in the same exact procedure and technique.

## 2023-06-13 NOTE — Patient Instructions (Signed)

## 2023-06-13 NOTE — Progress Notes (Signed)
 Safety precautions to be maintained throughout the outpatient stay will include: orient to surroundings, keep bed in low position, maintain call bell within reach at all times, provide assistance with transfer out of bed and ambulation.

## 2023-06-14 ENCOUNTER — Telehealth: Payer: Self-pay | Admitting: *Deleted

## 2023-06-14 NOTE — Telephone Encounter (Signed)
 Post procedure call; patient reports that he is doing well after procedure.

## 2023-06-18 ENCOUNTER — Telehealth: Payer: Self-pay

## 2023-06-18 NOTE — Telephone Encounter (Signed)
 Telephone call from patient stating that he got a call from our office, and he wanted to return our call. Patient states that he is not doing as well as he has with prior procedures. He is still having some pain.

## 2023-06-18 NOTE — Telephone Encounter (Signed)
 Patient concerned because he had a caudal ESI on 09-14-23, pain is not any better. I advised him that it has only been 4 days, it is expected that he may not have any pain relief at this point. Explained that he should allow 2 weeks, then if he continues to have no relief, he should call our office to schedule appt.

## 2023-06-25 ENCOUNTER — Telehealth: Payer: Self-pay | Admitting: Student in an Organized Health Care Education/Training Program

## 2023-06-25 NOTE — Telephone Encounter (Signed)
 I had advised him in a previous message to call to schedule appt if he is not improved.  Please call him to schedule eval appt.

## 2023-06-25 NOTE — Telephone Encounter (Signed)
 PT called stated that since the procedure on June 4th he hasn't had any changes. Pain has gotta worst. Pt wants to know what the next step will be. Please give patient a call. TY

## 2023-07-03 ENCOUNTER — Ambulatory Visit
Attending: Student in an Organized Health Care Education/Training Program | Admitting: Student in an Organized Health Care Education/Training Program

## 2023-07-03 ENCOUNTER — Encounter: Payer: Self-pay | Admitting: Student in an Organized Health Care Education/Training Program

## 2023-07-03 VITALS — BP 145/70 | HR 73 | Temp 97.9°F | Ht 66.0 in | Wt 156.0 lb

## 2023-07-03 DIAGNOSIS — M533 Sacrococcygeal disorders, not elsewhere classified: Secondary | ICD-10-CM | POA: Insufficient documentation

## 2023-07-03 DIAGNOSIS — M4808 Spinal stenosis, sacral and sacrococcygeal region: Secondary | ICD-10-CM | POA: Diagnosis present

## 2023-07-03 DIAGNOSIS — G8929 Other chronic pain: Secondary | ICD-10-CM | POA: Diagnosis present

## 2023-07-03 NOTE — Patient Instructions (Signed)

## 2023-07-03 NOTE — Progress Notes (Signed)
 PROVIDER NOTE: Interpretation of information contained herein should be left to medically-trained personnel. Specific patient instructions are provided elsewhere under Patient Instructions section of medical record. This document was created in part using AI and STT-dictation technology, any transcriptional errors that may result from this process are unintentional.  Patient: Zachary Fernandez  Service: E/M   PCP: Epifanio Alm SQUIBB, MD  DOB: 12-Dec-1940  DOS: 07/03/2023  Provider: Wallie Sherry, MD  MRN: [id number]  Delivery: Face-to-face  Specialty: Interventional Pain Management  Type: Established Patient  Setting: Ambulatory outpatient facility  Specialty designation: 09  Referring Prov.: Epifanio Alm SQUIBB, MD  Location: Outpatient office facility       History of present illness (HPI) [name], a 83 y.o. year old male, is here today because of his SI (sacroiliac) joint dysfunction [M53.3]. Mr. Voland primary complain today is Hip Pain (Left SI joint)  Pertinent problems: Mr. Overley has Chronic SI joint pain; Status post hip replacement, bilateral; History of bilateral knee replacement; Spinal stenosis, lumbar region, with neurogenic claudication; Chronic radicular lumbar pain; Lumbar spondylosis; Chronic pain syndrome; Pain management contract signed; Whiplash injury to neck; and Closed nondisplaced fracture of second cervical vertebra (HCC) on their pertinent problem list.  Pain Assessment: Severity of   is reported as a 7 /10. Location: Hip Left/radiates down left leg to knee. Onset: More than a month ago. Quality: Sharp. Timing: Intermittent. Modifying factor(s): changing positions. Vitals:  height is 5' 6 (1.676 m) and weight is 156 lb (70.8 kg). His temperature is 97.9 F (36.6 C). His blood pressure is 145/70 (abnormal) and his pulse is 73. His oxygen saturation is 100%.  BMI: Estimated body mass index is 25.18 kg/m as calculated from the following:   Height as of this encounter:  5' 6 (1.676 m).   Weight as of this encounter: 156 lb (70.8 kg).  Last encounter: 06/12/2023. Last procedure: 06/13/2023.  Reason for encounter:   Discussed the use of AI scribe software for clinical note transcription with the patient, who gave verbal consent to proceed.  History of Present Illness   Zachary Fernandez is an 83 year old male who presents with chronic hip pain.  He has been experiencing chronic pain in his left hip for approximately 25 years. The pain is unpredictable, with episodes severe enough to almost bowl him over when standing. He has undergone multiple procedures over the years, some providing relief for up to a year, while others were less effective. The most recent procedure- caudal ESI- offered minimal relief, with improvement noted only on the first day.  The pain impacts his ability to predict what activities he can perform, and he is concerned about the diminishing effectiveness of repeated procedures. Despite the pain, he remains active, going to the gym every other day and maintaining functionality, although he notes that he is 'still pretty functional' aside from the pain episodes.  He is not currently experiencing any pain while sitting, but the pain can be severe when he stands. He has previously undergone nerve blocks in [place]  as part of his treatment history.  He endorses pain in his sacral region and in his SI joints  No use of blood thinners.      ROS  Constitutional: Denies any fever or chills Gastrointestinal: No reported hemesis, hematochezia, vomiting, or acute GI distress Musculoskeletal: Low back, caudal, SI joint pain Neurological: No reported episodes of acute onset apraxia, aphasia, dysarthria, agnosia, amnesia, paralysis, loss of coordination, or loss of consciousness  Medication Review  Calcium   Citrate, Lotilaner, Multiple Vitamins-Minerals, Turmeric, acetaminophen , desipramine , finasteride, metoprolol  tartrate, polyethylene glycol,  rosuvastatin , tamsulosin , thiamine , traMADol , traZODone , and vitamin B-12  History Review  Allergy: Mr. Kunz is allergic to other. Drug: Mr. Kunst  reports no history of drug use. Alcohol:  reports current alcohol use. Tobacco:  reports that he has never smoked. He has never used smokeless tobacco. Social: Mr. Fristoe  reports that he has never smoked. He has never used smokeless tobacco. He reports current alcohol use. He reports that he does not use drugs. Medical:  has a past medical history of EtOH dependence (HCC), HLD (hyperlipidemia), and Hypertension. Surgical: Mr. Peral  has a past surgical history that includes Revision total hip arthroplasty (Bilateral); Knee Arthroplasty (Left); Shoulder surgery (Left); Cholecystectomy; and Hip Closed Reduction (Right, 12/12/2021). Family: family history includes Diabetes in his brother.  Laboratory Chemistry Profile   Renal Lab Results  Component Value Date   BUN 14 12/18/2022   CREATININE 0.77 12/18/2022   GFRNONAA >60 12/18/2022    Hepatic Lab Results  Component Value Date   AST 27 12/10/2022   ALT 21 12/10/2022   ALBUMIN 3.9 12/10/2022   ALKPHOS 92 12/10/2022    Electrolytes Lab Results  Component Value Date   NA 136 12/18/2022   K 3.8 12/18/2022   CL 100 12/18/2022   CALCIUM  9.2 12/18/2022   MG 1.9 12/16/2022   PHOS 3.9 12/16/2022    Bone Lab Results  Component Value Date   VD25OH 34.15 12/13/2022    Inflammation (CRP: Acute Phase) (ESR: Chronic Phase) Lab Results  Component Value Date   LATICACIDVEN 1.3 12/14/2022         Note: Above Lab results reviewed.  Recent Imaging Review  DG PAIN CLINIC C-ARM 1-60 MIN NO REPORT Fluoro was used, but no Radiologist interpretation will be provided.  Please refer to NOTES tab for provider progress note. Note: Reviewed        Physical Exam  General appearance: Well nourished, well developed, and well hydrated. In no apparent acute distress Mental status: Alert, oriented x  3 (person, place, & time)       Respiratory: No evidence of acute respiratory distress Eyes: PERLA Vitals: BP (!) 145/70   Pulse 73   Temp 97.9 F (36.6 C)   Ht 5' 6 (1.676 m)   Wt 156 lb (70.8 kg)   SpO2 100%   BMI 25.18 kg/m  BMI: Estimated body mass index is 25.18 kg/m as calculated from the following:   Height as of this encounter: 5' 6 (1.676 m).   Weight as of this encounter: 156 lb (70.8 kg). Ideal: Ideal body weight: 63.8 kg (140 lb 10.5 oz) Adjusted ideal body weight: 66.6 kg (146 lb 12.7 oz)  Lumbar Spine Area Exam  Skin & Axial Inspection: No masses, redness, or swelling Alignment: Symmetrical Functional ROM: Pain restricted ROM       Stability: No instability detected Muscle Tone/Strength: Functionally intact. No obvious neuro-muscular anomalies detected. Sensory (Neurological): Dermatomal pain pattern Palpation: No palpable anomalies       Provocative Tests: Hyperextension/rotation test: deferred today       Lumbar quadrant test (Kemp's test): deferred today       Lateral bending test: deferred today       Patrick's Maneuver: (+) for bilateral S-I arthralgia             FABER* test: (+) for bilateral S-I arthralgia             S-I  anterior distraction/compression test: (+) for bilateral S-I arthralgia             S-I lateral compression test: (+) for bilateral S-I arthralgia             S-I Thigh-thrust test: (+) for bilateral S-I arthralgia              Gait & Posture Assessment   Gait: Relatively normal for age and body habitus Posture: WNL  Lower Extremity Exam    Side: Right lower extremity  Side: Left lower extremity  Stability: No instability observed          Stability: No instability observed          Skin & Extremity Inspection: Skin color, temperature, and hair growth are WNL. No peripheral edema or cyanosis. No masses, redness, swelling, asymmetry, or associated skin lesions. No contractures.  Skin & Extremity Inspection: Skin color, temperature,  and hair growth are WNL. No peripheral edema or cyanosis. No masses, redness, swelling, asymmetry, or associated skin lesions. No contractures.  Functional ROM: Unrestricted ROM                  Functional ROM: Unrestricted ROM                  Muscle Tone/Strength: Functionally intact. No obvious neuro-muscular anomalies detected.  Muscle Tone/Strength: Functionally intact. No obvious neuro-muscular anomalies detected.  Sensory (Neurological): Unimpaired        Sensory (Neurological): Unimpaired        DTR: Patellar: deferred today Achilles: deferred today Plantar: deferred today  DTR: Patellar: deferred today Achilles: deferred today Plantar: deferred today  Palpation: No palpable anomalies  Palpation: No palpable anomalies    Assessment   Diagnosis Status  1. SI (sacroiliac) joint dysfunction   2. Spinal stenosis, sacral and sacrococcygeal region   3. Chronic SI joint pain    Having a Flare-up Having a Flare-up Having a Flare-up   Patient with chronic lower back pain localized over the sacroiliac (SI) joints, worse with prolonged sitting, standing, or transitional movements. Positive findings on provocative maneuvers (e.g., FABER, [place]len as above) consistent with SI joint-mediated pain.. Prior conservative treatments (e.g., NSAIDs, physical therapy) have yielded limited relief. Pain distribution and clinical exam suggest involvement of the posterior SI complex.  Plan:  Proceed with diagnostic S1-S3 lateral branch blocks under fluoroscopic guidance to assess for pain relief and confirm SI joint as pain generator.  If patient experiences >50% pain relief, will consider proceeding with radiofrequency ablation (RFA) of the lateral branches for longer-term relief.  Continue core stabilization and physical therapy focused on pelvic and SI joint mechanics.  Monitor response and reassess function and pain level at follow-up.  Orders:  Orders Placed This Encounter  Procedures    PR INJECTION AA&/STRD OTHER PERIPHERAL NERVE/BRANCH    B/L sacral lateral branch block S1-S3 ECT  35549 x6  With fluoro [id number]      Return in about 2 weeks (around 07/17/2023) for B/L S1-S3 lateral branch , ECT.    Recent Visits Date Type Provider Dept  06/13/23 Procedure visit Marcelino Nurse, MD [facility]  06/12/23 Office Visit Marcelino Nurse, MD [facility]  04/10/23 Office Visit Marcelino Nurse, MD [facility]  Showing recent visits within past 90 days and meeting all other requirements Today's Visits Date Type Provider Dept  07/03/23 Office Visit Marcelino Nurse, MD [facility]  Showing today's visits and meeting all other requirements Future Appointments Date  Type Provider Dept  09/13/23 Appointment Patel, Seema K, NP [facility]  Showing future appointments within next 90 days and meeting all other requirements  I discussed the assessment and treatment plan with the patient. The patient was provided an opportunity to ask questions and all were answered. The patient agreed with the plan and demonstrated an understanding of the instructions.  Patient advised to call back or seek an in-person evaluation if the symptoms or condition worsens.  Duration of encounter: .  Total time on encounter, as per AMA guidelines included both the face-to-face and non-face-to-face time personally spent by the physician and/or other qualified health care professional(s) on the day of the encounter (includes time in activities that require the physician or other qualified health care professional and does not include time in activities normally performed by clinical staff). Physician's time may include the following activities when performed: Preparing to see the patient (e.g., pre-charting review of records, searching for previously ordered imaging, lab work, and nerve conduction tests) Review of prior analgesic pharmacotherapies. Reviewing  PMP Interpreting ordered tests (e.g., lab work, imaging, nerve conduction tests) Performing post-procedure evaluations, including interpretation of diagnostic procedures Obtaining and/or reviewing separately obtained history Performing a medically appropriate examination and/or evaluation Counseling and educating the patient/family/caregiver Ordering medications, tests, or procedures Referring and communicating with other health care professionals (when not separately reported) Documenting clinical information in the electronic or other health record Independently interpreting results (not separately reported) and communicating results to the patient/ family/caregiver Care coordination (not separately reported)  Note by: Wallie Sherry, MD (TTS and AI technology used. I apologize for any typographical errors that were not detected and corrected.) Date: 07/03/2023; Time: 11:20 AM

## 2023-07-03 NOTE — Progress Notes (Signed)
 Safety precautions to be maintained throughout the outpatient stay will include: orient to surroundings, keep bed in low position, maintain call bell within reach at all times, provide assistance with transfer out of bed and ambulation.

## 2023-07-05 ENCOUNTER — Encounter: Admitting: Student in an Organized Health Care Education/Training Program

## 2023-07-18 ENCOUNTER — Ambulatory Visit: Admitting: Student in an Organized Health Care Education/Training Program

## 2023-07-18 ENCOUNTER — Ambulatory Visit
Admission: RE | Admit: 2023-07-18 | Discharge: 2023-07-18 | Disposition: A | Discharge status: Home / Self Care | Source: Ambulatory Visit | Attending: Student in an Organized Health Care Education/Training Program | Admitting: Student in an Organized Health Care Education/Training Program

## 2023-07-18 ENCOUNTER — Encounter: Payer: Self-pay | Admitting: Student in an Organized Health Care Education/Training Program

## 2023-07-18 VITALS — BP 131/70 | HR 79 | Temp 98.4°F | Resp 17 | Ht 66.0 in | Wt 157.0 lb

## 2023-07-18 DIAGNOSIS — G894 Chronic pain syndrome: Secondary | ICD-10-CM | POA: Diagnosis present

## 2023-07-18 DIAGNOSIS — M533 Sacrococcygeal disorders, not elsewhere classified: Secondary | ICD-10-CM

## 2023-07-18 DIAGNOSIS — Z96643 Presence of artificial hip joint, bilateral: Secondary | ICD-10-CM | POA: Insufficient documentation

## 2023-07-18 DIAGNOSIS — Z96652 Presence of left artificial knee joint: Secondary | ICD-10-CM | POA: Insufficient documentation

## 2023-07-18 DIAGNOSIS — Z79899 Other long term (current) drug therapy: Secondary | ICD-10-CM | POA: Insufficient documentation

## 2023-07-18 DIAGNOSIS — M4808 Spinal stenosis, sacral and sacrococcygeal region: Secondary | ICD-10-CM | POA: Insufficient documentation

## 2023-07-18 DIAGNOSIS — G8929 Other chronic pain: Secondary | ICD-10-CM

## 2023-07-18 MED ORDER — DEXAMETHASONE SODIUM PHOSPHATE 10 MG/ML IJ SOLN
20.0000 mg | Freq: Once | INTRAMUSCULAR | Status: AC
  Administered 2023-07-18: 20 mg
  Filled 2023-07-18: qty 2

## 2023-07-18 MED ORDER — ROPIVACAINE HCL 2 MG/ML IJ SOLN
INTRAMUSCULAR | Status: AC
Start: 2023-07-18 — End: 2023-07-18
  Filled 2023-07-18: qty 20

## 2023-07-18 MED ORDER — MIDAZOLAM HCL 5 MG/5ML IJ SOLN
INTRAMUSCULAR | Status: AC
  Filled 2023-07-18: qty 5

## 2023-07-18 MED ORDER — FENTANYL CITRATE (PF) 100 MCG/2ML IJ SOLN
INTRAMUSCULAR | Status: AC
  Filled 2023-07-18: qty 2

## 2023-07-18 MED ORDER — LIDOCAINE HCL 2 % IJ SOLN
20.0000 mL | Freq: Once | INTRAMUSCULAR | Status: AC
  Administered 2023-07-18: 400 mg

## 2023-07-18 MED ORDER — ROPIVACAINE HCL 2 MG/ML IJ SOLN
18.0000 mL | Freq: Once | INTRAMUSCULAR | Status: AC
  Administered 2023-07-18: 18 mL via PERINEURAL

## 2023-07-18 MED ORDER — DEXAMETHASONE SODIUM PHOSPHATE 10 MG/ML IJ SOLN
INTRAMUSCULAR | Status: AC
Start: 2023-07-18 — End: 2023-07-18
  Filled 2023-07-18: qty 1

## 2023-07-18 MED ORDER — LACTATED RINGERS IV SOLN: Freq: Once | INTRAVENOUS | Status: DC

## 2023-07-18 MED ORDER — FENTANYL CITRATE (PF) 100 MCG/2ML IJ SOLN
25.0000 ug | INTRAMUSCULAR | Status: DC | PRN
  Administered 2023-07-18: 75 ug via INTRAVENOUS

## 2023-07-18 MED ORDER — LIDOCAINE HCL 2 % IJ SOLN
INTRAMUSCULAR | Status: AC
  Filled 2023-07-18: qty 20

## 2023-07-18 MED ORDER — MIDAZOLAM HCL 5 MG/5ML IJ SOLN
0.5000 mg | Freq: Once | INTRAMUSCULAR | Status: AC
  Administered 2023-07-18: 1 mg via INTRAVENOUS

## 2023-07-18 NOTE — Patient Instructions (Addendum)
 Post-Procedure Discharge Instructions  Instructions: Apply ice:  Purpose: This will minimize any swelling and discomfort after procedure.  When: Day of procedure, as soon as you get home. How: Fill a plastic sandwich bag with crushed ice. Cover it with a small towel and apply to injection site. How long: (15 min on, 15 min off) Apply for 15 minutes then remove x 15 minutes.  Repeat sequence on day of procedure, until you go to bed. Apply heat:  Purpose: To treat any soreness and discomfort from the procedure. When: Starting the next day after the procedure. How: Apply heat to procedure site starting the day following the procedure. How long: May continue to repeat daily, until discomfort goes away. Food intake: Start with clear liquids (like water) and advance to regular food, as tolerated.  Physical activities: Keep activities to a minimum for the first 8 hours after the procedure. After that, then as tolerated. Driving: If you have received any sedation, be responsible and do not drive. You are not allowed to drive for 24 hours after having sedation. Blood thinner: (Applies only to those taking blood thinners) You may restart your blood thinner 6 hours after your procedure. Insulin: (Applies only to Diabetic patients taking insulin) As soon as you can eat, you may resume your normal dosing schedule. Infection prevention: Keep procedure site clean and dry. Shower daily and clean area with soap and water. Post-procedure Pain Diary: Extremely important that this be done correctly and accurately. Recorded information will be used to determine the next step in treatment. For the purpose of accuracy, follow these rules: Evaluate only the area treated. Do not report or include pain from an untreated area. For the purpose of this evaluation, ignore all other areas of pain, except for the treated area. After your procedure, avoid taking a long nap and attempting to complete the pain diary after you  wake up. Instead, set your alarm clock to go off every hour, on the hour, for the initial 8 hours after the procedure. Document the duration of the numbing medicine, and the relief you are getting from it. Do not go to sleep and attempt to complete it later. It will not be accurate. If you received sedation, it is likely that you were given a medication that may cause amnesia. Because of this, completing the diary at a later time may cause the information to be inaccurate. This information is needed to plan your care. Follow-up appointment: Keep your post-procedure follow-up evaluation appointment after the procedure (usually 2 weeks for most procedures, 6 weeks for radiofrequencies). DO NOT FORGET to bring you pain diary with you.   Expect: (What should I expect to see with my procedure?) From numbing medicine (AKA: Local Anesthetics): Numbness or decrease in pain. You may also experience some weakness, which if present, could last for the duration of the local anesthetic. Onset: Full effect within 15 minutes of injected. Duration: It will depend on the type of local anesthetic used. On the average, 1 to 8 hours.  From steroids (Applies only if steroids were used): Decrease in swelling or inflammation. Once inflammation is improved, relief of the pain will follow. Onset of benefits: Depends on the amount of swelling present. The more swelling, the longer it will take for the benefits to be seen. In some cases, up to 10 days. Duration: Steroids will stay in the system x 2 weeks. Duration of benefits will depend on multiple posibilities including persistent irritating factors. Side-effects: If present, they may typically  last 2 weeks (the duration of the steroids). Frequent: Cramps (if they occur, drink Gatorade and take over-the-counter Magnesium 450-500 mg once to twice a day); water retention with temporary weight gain; increases in blood sugar; decreased immune system response; increased  appetite. Occasional: Facial flushing (red, warm cheeks); mood swings; menstrual changes. Uncommon: Long-term decrease or suppression of natural hormones; bone thinning. (These are more common with higher doses or more frequent use. This is why we prefer that our patients avoid having any injection therapies in other practices.)  Very Rare: Severe mood changes; psychosis; aseptic necrosis. From procedure: Some discomfort is to be expected once the numbing medicine wears off. This should be minimal if ice and heat are applied as instructed.  Call if: (When should I call?) You experience numbness and weakness that gets worse with time, as opposed to wearing off. New onset bowel or bladder incontinence. (Applies only to procedures done in the spine)  Emergency Numbers: Durning business hours (Monday - Thursday, 8:00 AM - 4:00 PM) (Friday, 9:00 AM - 12:00 Noon): [phone] After hours: [phone] NOTE: If you are having a problem and are unable connect with, or to talk to a provider, then go to your nearest urgent care or emergency department. If the problem is serious and urgent, please call 911.  Moderate Conscious Sedation, Adult, Care After After the procedure, it is common to have: Sleepiness for a few hours. Impaired judgment for a few hours. Trouble with balance. Nausea or vomiting if you eat too soon. Follow these instructions at home: For the time period you were told by your health care provider:  Rest. Do not participate in activities where you could fall or become injured. Do not drive or use machinery. Do not drink alcohol. Do not take sleeping pills or medicines that cause drowsiness. Do not make important decisions or sign legal documents. Do not take care of children on your own. Eating and drinking Follow instructions from your health care provider about what you may eat and drink. Drink enough fluid to keep your urine pale yellow. If you vomit: Drink clear  fluids slowly and in small amounts as you are able. Clear fluids include water, ice chips, low-calorie sports drinks, and fruit juice that has water added to it (diluted fruit juice). Eat light and bland foods in small amounts as you are able. These foods include bananas, applesauce, rice, lean meats, toast, and crackers. General instructions Take over-the-counter and prescription medicines only as told by your health care provider. Have a responsible adult stay with you for the time you are told. Do not use any products that contain nicotine or tobacco. These products include cigarettes, chewing tobacco, and vaping devices, such as e-cigarettes. If you need help quitting, ask your health care provider. Return to your normal activities as told by your health care provider. Ask your health care provider what activities are safe for you. Your health care provider may give you more instructions. Make sure you know what you can and cannot do.   Contact a health care provider if: You are still sleepy or having trouble with balance after 24 hours. You feel light-headed. You vomit every time you eat or drink. You get a rash. You have a fever. You have redness or swelling around the IV site. Get help right away if: You have trouble breathing. You start to feel confused at home. These symptoms may be an emergency. Get help right away. Call 911. Do not wait to see  if the symptoms will go away. Do not drive yourself to the hospital. This information is not intended to replace advice given to you by your health care provider. Make sure you discuss any questions you have with your health care provider. Document Revised: 07/11/2021 Document Reviewed: 07/11/2021 Elsevier Patient Education  2024 [employer].

## 2023-07-18 NOTE — Progress Notes (Signed)
 PROVIDER NOTE: Interpretation of information contained herein should be left to medically-trained personnel. Specific patient instructions are provided elsewhere under Patient Instructions section of medical record. This document was created in part using STT-dictation technology, any transcriptional errors that may result from this process are unintentional.  Patient: Zachary Fernandez Type: Established DOB: [birth year] MRN: [id number] PCP: Epifanio Alm SQUIBB, MD  Service: Procedure DOS: 07/18/2023 Setting: Ambulatory Location: Ambulatory outpatient facility Delivery: Face-to-face Provider: Wallie Sherry, MD Specialty: Interventional Pain Management Specialty designation: 09 Location: Outpatient facility Ref. Prov.: Epifanio Alm SQUIBB, MD       Interventional Therapy   Primary Reason for Visit: Interventional Pain Management Treatment. CC: Back Pain    Procedure:          Anesthesia, Analgesia, Anxiolysis:  Type: Diagnostic Sacral  Nerve Block(s) #1  Region: Posterior Sacral Lateral branch Region Level: S1, S2, S3       Laterality: Bilateral  Anesthesia: Local (1-2% Lidocaine )  Anxiolysis: None  Sedation: Moderate  Guidance: Fluoroscopy           Position: Prone   1. SI (sacroiliac) joint dysfunction   2. Spinal stenosis, sacral and sacrococcygeal region   3. Chronic SI joint pain   4. Chronic pain syndrome    NAS-11 Pain score:   Pre-procedure: 3/10   Post-procedure: 0-No pain/10     H&P (Pre-op Assessment):  Zachary Fernandez is a 83 y.o. (year old), male patient, seen today for interventional treatment. He  has a past surgical history that includes Revision total hip arthroplasty (Bilateral); Knee Arthroplasty (Left); Shoulder surgery (Left); Cholecystectomy; and Hip Closed Reduction (Right, 12/12/2021). Zachary Fernandez has a current medication list which includes the following prescription(s): acetaminophen , calcium  citrate, carbamide peroxide, desipramine , esomeprazole, finasteride,  krill oil, meloxicam, metoprolol  tartrate, multiple vitamins-minerals, polyethylene glycol, propylene glycol, rosuvastatin , saw palmetto, tadalafil, tamsulosin , testosterone, thiamine , tramadol , trazodone , vitamin e, vitamin e, zinc gluconate, turmeric, vitamin b-12, and xdemvy, and the following Facility-Administered Medications: fentanyl  and lactated ringers . His primarily concern today is the Back Pain  Initial Vital Signs:  Pulse/HCG Rate: 79ECG Heart Rate: 77 (nsr) Temp: 97.7 F (36.5 C) Resp: 16 BP: (!) 154/68 SpO2: 98 %  BMI: Estimated body mass index is 25.34 kg/m as calculated from the following:   Height as of this encounter: 5' 6 (1.676 m).   Weight as of this encounter: 157 lb (71.2 kg).  Risk Assessment: Allergies: Reviewed. He is allergic to other.  Allergy Precautions: None required Coagulopathies: Reviewed. None identified.  Blood-thinner therapy: None at this time Active Infection(s): Reviewed. None identified. Zachary Fernandez is afebrile  Site Confirmation: Zachary Fernandez was asked to confirm the procedure and laterality before marking the site Procedure checklist: Completed Consent: Before the procedure and under the influence of no sedative(s), amnesic(s), or anxiolytics, the patient was informed of the treatment options, risks and possible complications. To fulfill our ethical and legal obligations, as recommended by the American Medical Association's Code of Ethics, I have informed the patient of my clinical impression; the nature and purpose of the treatment or procedure; the risks, benefits, and possible complications of the intervention; the alternatives, including doing nothing; the risk(s) and benefit(s) of the alternative treatment(s) or procedure(s); and the risk(s) and benefit(s) of doing nothing. The patient was provided information about the general risks and possible complications associated with the procedure. These may include, but are not limited to: failure to  achieve desired goals, infection, bleeding, organ or nerve damage, allergic reactions, paralysis, and death. In addition, the  patient was informed of those risks and complications associated to Spine-related procedures, such as failure to decrease pain; infection (i.e.: Meningitis, epidural or intraspinal abscess); bleeding (i.e.: epidural hematoma, subarachnoid hemorrhage, or any other type of intraspinal or peri-dural bleeding); organ or nerve damage (i.e.: Any type of peripheral nerve, nerve root, or spinal cord injury) with subsequent damage to sensory, motor, and/or autonomic systems, resulting in permanent pain, numbness, and/or weakness of one or several areas of the body; allergic reactions; (i.e.: anaphylactic reaction); and/or death. Furthermore, the patient was informed of those risks and complications associated with the medications. These include, but are not limited to: allergic reactions (i.e.: anaphylactic or anaphylactoid reaction(s)); adrenal axis suppression; blood sugar elevation that in diabetics may result in ketoacidosis or comma; water retention that in patients with history of congestive heart failure may result in shortness of breath, pulmonary edema, and decompensation with resultant heart failure; weight gain; swelling or edema; medication-induced neural toxicity; particulate matter embolism and blood vessel occlusion with resultant organ, and/or nervous system infarction; and/or aseptic necrosis of one or more joints. Finally, the patient was informed that Medicine is not an exact science; therefore, there is also the possibility of unforeseen or unpredictable risks and/or possible complications that may result in a catastrophic outcome. The patient indicated having understood very clearly. We have given the patient no guarantees and we have made no promises. Enough time was given to the patient to ask questions, all of which were answered to the patient's satisfaction. Zachary Fernandez has  indicated that he wanted to continue with the procedure. Attestation: I, the ordering provider, attest that I have discussed with the patient the benefits, risks, side-effects, alternatives, likelihood of achieving goals, and potential problems during recovery for the procedure that I have provided informed consent. Date  Time: 07/18/2023 12:40 PM  Pre-Procedure Preparation:  Monitoring: As per clinic protocol. Respiration, ETCO2, SpO2, BP, heart rate and rhythm monitor placed and checked for adequate function Safety Precautions: Patient was assessed for positional comfort and pressure points before starting the procedure. Time-out: I initiated and conducted the Time-out before starting the procedure, as per protocol. The patient was asked to participate by confirming the accuracy of the Time Out information. Verification of the correct person, site, and procedure were performed and confirmed by me, the nursing staff, and the patient. Time-out conducted as per Joint Commission's Universal Protocol (UP.01.01.01). Time: 1346 Start Time: 1346 hrs.  Description of Procedure:           Area Prepped: Entire Posterior Lumbosacral Region ChloraPrep (2% chlorhexidine gluconate and 70% isopropyl alcohol) Safety Precautions: Aspiration looking for blood return was conducted prior to all injections. At no point did we inject any substances, as a needle was being advanced. No attempts were made at seeking any paresthesias. Safe injection practices and needle disposal techniques used. Medications properly checked for expiration dates. SDV (single dose vial) medications used.  Under fluoroscopic guidance, the bilateral sacral lateral branch nerve targets at S1, S2, and S3 were identified at the respective sacral foraminal margins.  A 22-gauge, 3.5 inch spinal needle was advanced under fluoroscopy toward the lateral aspect of the S1, S2, and S3 sacral foramina bilaterally. Proper needle placement was  confirmed in AP views. Following negative aspiration, each target site was injected with 2 cc of solution containing 10 cc of 0.2% ropivacaine , 2 cc of Decadron  10 mg/cc bilaterally.  Needles were appropriately positioned over the lateral sacral branches at S1-S3 bilaterally. Medications were injected without complication.  Vitals:   07/18/23 1347 07/18/23 1352 07/18/23 1402 07/18/23 1412  BP: (!) 185/95 (!) 176/96 (!) 142/73 (!) 141/72  Pulse:      Resp: 15 18 17 16   Temp:      TempSrc:      SpO2: 100% 100% 95% 94%  Weight:      Height:        Start Time: 1346 hrs. End Time: 1352 hrs. Materials:  Needle(s) Type: Spinal Needle Gauge: 22G Length: 3.5-in Medication(s): Please see orders for medications and dosing details.  Imaging Guidance (Spinal):          Type of Imaging Technique: Fluoroscopy Guidance (Spinal) Indication(s): Fluoroscopy guidance for needle placement to enhance accuracy in procedures requiring precise needle localization for targeted delivery of medication in or near specific anatomical locations not easily accessible without such real-time imaging assistance. Exposure Time: Please see nurses notes. Contrast: None used. Fluoroscopic Guidance: I was personally present during the use of fluoroscopy. Tunnel Vision Technique used to obtain the best possible view of the target area. Parallax error corrected before commencing the procedure. Direction-depth-direction technique used to introduce the needle under continuous pulsed fluoroscopy. Once target was reached, antero-posterior, oblique, and lateral fluoroscopic projection used confirm needle placement in all planes. Images permanently stored in EMR. Interpretation: No contrast injected. I personally interpreted the imaging intraoperatively. Adequate needle placement confirmed in multiple planes. Permanent images saved into the patient's record.  Antibiotic Prophylaxis:   Anti-infectives (From admission,  onward)    None      Indication(s): None identified  Post-operative Assessment:  Post-procedure Vital Signs:  Pulse/HCG Rate: 7970 Temp: 97.7 F (36.5 C) Resp: 16 BP: (!) 141/72 SpO2: 94 %  EBL: None  Complications: No immediate post-treatment complications observed by team, or reported by patient.  Note: The patient tolerated the entire procedure well. A repeat set of vitals were taken after the procedure and the patient was kept under observation following institutional policy, for this type of procedure. Post-procedural neurological assessment was performed, showing return to baseline, prior to discharge. The patient was provided with post-procedure discharge instructions, including a section on how to identify potential problems. Should any problems arise concerning this procedure, the patient was given instructions to immediately contact us , at any time, without hesitation. In any case, we plan to contact the patient by telephone for a follow-up status report regarding this interventional procedure.  Comments:  No additional relevant information.  Plan of Care (POC)  Orders:  Orders Placed This Encounter  Procedures   DG PAIN CLINIC C-ARM 1-60 MIN NO REPORT    Intraoperative interpretation by procedural physician at [facility].    Standing Status:   Standing    Number of Occurrences:   1    Reason for exam::   Assistance in needle guidance and placement for procedures requiring needle placement in or near specific anatomical locations not easily accessible without such assistance.    Tramadol  50-100 mg TID prn   Medications ordered for procedure: Meds ordered this encounter  Medications   lidocaine  (XYLOCAINE ) 2 % (with pres) injection 400 mg   lactated ringers  infusion   midazolam  (VERSED ) 5 MG/5ML injection 0.5-2 mg    Make sure Flumazenil is available in the pyxis when using this medication. If oversedation occurs, administer 0.2 mg IV over 15 sec. If  after 45 sec no response, administer 0.2 mg again over 1 min; may repeat at 1 min intervals; not to exceed 4 doses (1 mg)  fentaNYL  (SUBLIMAZE ) injection 25-50 mcg    Make sure Narcan is available in the pyxis when using this medication. In the event of respiratory depression (RR< 8/min): Titrate NARCAN (naloxone) in increments of 0.1 to 0.2 mg IV at 2-3 minute intervals, until desired degree of reversal.   dexamethasone  (DECADRON ) injection 20 mg   ropivacaine  (PF) 2 mg/mL (0.2%) (NAROPIN ) injection 18 mL   Medications administered: We administered lidocaine , midazolam , fentaNYL , dexamethasone , and ropivacaine  (PF) 2 mg/mL (0.2%).  See the medical record for exact dosing, route, and time of administration.    Interventional management options:  Considering: Occipital nerve block   C2-C3 medial branch nerve block Lumbar epidural steroid injection Lumbar facet medial branch nerve blocks S1-S3 lateral branch nerve blocks SI joint injection Sprint peripheral nerve stimulation   PRN Procedures:   None at this time          Follow-up plan:   Return in about 4 weeks (around 08/15/2023), or VV PPE.     Recent Visits Date Type Provider Dept  07/03/23 Office Visit Marcelino Nurse, MD [facility]  06/13/23 Procedure visit Marcelino Nurse, MD [facility]  06/12/23 Office Visit Marcelino Nurse, MD [facility]  Showing recent visits within past 90 days and meeting all other requirements Today's Visits Date Type Provider Dept  07/18/23 Procedure visit Marcelino Nurse, MD [facility]  Showing today's visits and meeting all other requirements Future Appointments Date Type Provider Dept  08/15/23 Appointment Marcelino Nurse, MD [facility]  09/13/23 Appointment Patel, Seema K, NP [facility]  Showing future appointments within next 90 days and meeting all other requirements   Disposition: Discharge home  Discharge (Date  Time):  07/18/2023;   hrs.   Primary Care Physician: Epifanio Alm SQUIBB, MD Location: [facility] Note by: Nurse Marcelino, MD (TTS technology used. I apologize for any typographical errors that were not detected and corrected.) Date: 07/18/2023; Time: 2:18 PM  Disclaimer:  Medicine is not an Visual merchandiser. The only guarantee in medicine is that nothing is guaranteed. It is important to note that the decision to proceed with this intervention was based on the information collected from the patient. The Data and conclusions were drawn from the patient's questionnaire, the interview, and the physical examination. Because the information was provided in large part by the patient, it cannot be guaranteed that it has not been purposely or unconsciously manipulated. Every effort has been made to obtain as much relevant data as possible for this evaluation. It is important to note that the conclusions that lead to this procedure are derived in large part from the available data. Always take into account that the treatment will also be dependent on availability of resources and existing treatment guidelines, considered by other Pain Management Practitioners as being common knowledge and practice, at the time of the intervention. For Medico-Legal purposes, it is also important to point out that variation in procedural techniques and pharmacological choices are the acceptable norm. The indications, contraindications, technique, and results of the above procedure should only be interpreted and judged by a Board-Certified Interventional Pain Specialist with extensive familiarity and expertise in the same exact procedure and technique.

## 2023-07-19 ENCOUNTER — Telehealth: Payer: Self-pay | Admitting: *Deleted

## 2023-07-19 ENCOUNTER — Telehealth: Payer: Self-pay | Admitting: Student in an Organized Health Care Education/Training Program

## 2023-07-19 NOTE — Telephone Encounter (Signed)
Called for post procedure check. No answer. LVM. 

## 2023-07-19 NOTE — Telephone Encounter (Signed)
 PT called states that he is doing well since procedure. PT states his blood sugar is up a little. FYI

## 2023-07-25 ENCOUNTER — Telehealth: Payer: Self-pay | Admitting: Student in an Organized Health Care Education/Training Program

## 2023-07-25 NOTE — Telephone Encounter (Signed)
 Patient wants to speak with Nurse. He got epid that only lasted 2 days and has not exasperated his situation. I moved patient appt up to F2F 07-26-23 at 8:40

## 2023-07-26 ENCOUNTER — Encounter: Payer: Self-pay | Admitting: Student in an Organized Health Care Education/Training Program

## 2023-07-26 ENCOUNTER — Ambulatory Visit
Attending: Student in an Organized Health Care Education/Training Program | Admitting: Student in an Organized Health Care Education/Training Program

## 2023-07-26 VITALS — BP 159/76 | HR 65 | Temp 98.2°F | Resp 16 | Ht 64.0 in | Wt 158.0 lb

## 2023-07-26 DIAGNOSIS — M5416 Radiculopathy, lumbar region: Secondary | ICD-10-CM | POA: Insufficient documentation

## 2023-07-26 DIAGNOSIS — G894 Chronic pain syndrome: Secondary | ICD-10-CM | POA: Insufficient documentation

## 2023-07-26 DIAGNOSIS — M533 Sacrococcygeal disorders, not elsewhere classified: Secondary | ICD-10-CM | POA: Diagnosis present

## 2023-07-26 DIAGNOSIS — M4808 Spinal stenosis, sacral and sacrococcygeal region: Secondary | ICD-10-CM | POA: Insufficient documentation

## 2023-07-26 DIAGNOSIS — G8929 Other chronic pain: Secondary | ICD-10-CM | POA: Insufficient documentation

## 2023-07-26 DIAGNOSIS — M48062 Spinal stenosis, lumbar region with neurogenic claudication: Secondary | ICD-10-CM | POA: Insufficient documentation

## 2023-07-26 MED ORDER — PREDNISONE 20 MG PO TABS: ORAL_TABLET | ORAL | 0 refills | Status: AC

## 2023-07-26 NOTE — Progress Notes (Signed)
 PROVIDER NOTE: Interpretation of information contained herein should be left to medically-trained personnel. Specific patient instructions are provided elsewhere under Patient Instructions section of medical record. This document was created in part using AI and STT-dictation technology, any transcriptional errors that may result from this process are unintentional.  Patient: Zachary Fernandez  Service: E/M   PCP: Epifanio Alm SQUIBB, MD  DOB: 1940-04-22  DOS: 07/26/2023  Provider: Wallie Sherry, MD  MRN: [id number]  Delivery: Face-to-face  Specialty: Interventional Pain Management  Type: Established Patient  Setting: Ambulatory outpatient facility  Specialty designation: 09  Referring Prov.: Epifanio Alm SQUIBB, MD  Location: Outpatient office facility       History of present illness (HPI) Zachary Fernandez, a 83 y.o. year old male, is here today because of his SI (sacroiliac) joint dysfunction [M53.3]. Zachary Fernandez primary complain today is Back Pain  Pertinent problems: Zachary Fernandez has Chronic SI joint pain; Status post hip replacement, bilateral; History of bilateral knee replacement; Spinal stenosis, lumbar region, with neurogenic claudication; Chronic radicular lumbar pain; Lumbar spondylosis; Chronic pain syndrome; Pain management contract signed; Whiplash injury to neck; and Closed nondisplaced fracture of second cervical vertebra (HCC) on their pertinent problem list.  Pain Assessment: Severity of Chronic pain is reported as a 5 /10. Location: Back Lower/sometimes down to right thigh. Onset: More than a month ago. Quality: Sharp. Timing: Constant. Modifying factor(s): sitting. Vitals:  height is 5' 4 (1.626 m) and weight is 158 lb (71.7 kg). His temperature is 98.2 F (36.8 C). His blood pressure is 159/76 (abnormal) and his pulse is 65. His respiration is 16 and oxygen saturation is 99%.  BMI: Estimated body mass index is 27.12 kg/m as calculated from the following:   Height as of this  encounter: 5' 4 (1.626 m).   Weight as of this encounter: 158 lb (71.7 kg).  Last encounter: 07/03/2023. Last procedure: 07/18/2023.  Reason for encounter:   Discussed the use of AI scribe software for clinical note transcription with the patient, who gave verbal consent to proceed.  Post-Procedure Evaluation    Procedure:          Anesthesia, Analgesia, Anxiolysis:  Type: Diagnostic Sacral  Nerve Block(s) #1  Region: Posterior Sacral Lateral branch Region Level: S1, S2, S3       Laterality: Bilateral  Anesthesia: Local (1-2% Lidocaine )  Anxiolysis: None  Sedation: Moderate  Guidance: Fluoroscopy           Position: Prone   1. SI (sacroiliac) joint dysfunction   2. Spinal stenosis, sacral and sacrococcygeal region   3. Chronic SI joint pain   4. Chronic pain syndrome    NAS-11 Pain score:   Pre-procedure: 3/10   Post-procedure: 0-No pain/10     Effectiveness:  Initial hour after procedure: 100 %  Subsequent 4-6 hours post-procedure: 100 % (X 2 days)  Analgesia past initial 6 hours:  (pain now worse than pre-procedures; worse at night)  Ongoing improvement:  Analgesic:  0%    History of Present Illness   Zachary Fernandez is an 83 year old male who presents with worsening sacroiliac joint pain and increased urinary frequency following a sacral nerve block.  He has experienced worsening sacroiliac joint pain following a sacral nerve block procedure performed on July 18, 2023. The procedure involved lateral branch nerve blocks targeting the sacrum. He has previously undergone similar procedures in [place] , but this time the pain has exacerbated since the procedure.  He reports increased urinary frequency and nocturnal leakage,  which he attributes to the nerve block procedure. A urinary tract infection test returned negative results. He notes an unpleasant smell and leakage at night. No burning sensation during urination, bladder distention, or retention of urine.      UDS:   Summary  Date Value Ref Range Status  02/16/2020 Note  Final    Comment:    ==================================================================== Compliance Drug Analysis, Ur ==================================================================== Test                             Result       Flag       Units  Drug Present and Declared for Prescription Verification   Tramadol                        >3247        EXPECTED   ng/mg creat   O-Desmethyltramadol            >3247        EXPECTED   ng/mg creat   N-Desmethyltramadol            964          EXPECTED   ng/mg creat    Source of tramadol  is a prescription medication. O-desmethyltramadol    and N-desmethyltramadol are expected metabolites of tramadol .    Desipramine                     PRESENT      EXPECTED    Desipramine  may be administered as a prescription drug; it is also    an expected metabolite of imipramine.  Drug Absent but Declared for Prescription Verification   Gabapentin                      Not Detected UNEXPECTED ==================================================================== Test                      Result    Flag   Units      Ref Range   Creatinine              154              mg/dL      >=79 ==================================================================== Declared Medications:  The flagging and interpretation on this report are based on the  following declared medications.  Unexpected results may arise from  inaccuracies in the declared medications.   **Note: The testing scope of this panel includes these medications:   Desipramine  (Norpramin )  Gabapentin  (Neurontin )  Tramadol  (Ultram )   **Note: The testing scope of this panel does not include the  following reported medications:   Amlodipine  (Norvasc )  Bisoprolol   Calcium   Hydrochlorothiazide   Multivitamin  Rosuvastatin  (Crestor )  Supplement  Tamsulosin  (Flomax )  Testosterone   Turmeric ==================================================================== For clinical consultation, please call [phone]. ====================================================================     No results found for: CBDTHCR No results found for: D8THCCBX No results found for: D9THCCBX  ROS  Constitutional: Denies any fever or chills Gastrointestinal: No reported hemesis, hematochezia, vomiting, or acute GI distress Musculoskeletal: as above Neurological: No reported episodes of acute onset apraxia, aphasia, dysarthria, agnosia, amnesia, paralysis, loss of coordination, or loss of consciousness  Medication Review  Calcium  Citrate, Krill Oil, Lotilaner, Multiple Vitamins-Minerals, Propylene Glycol, Turmeric, acetaminophen , carbamide peroxide, desipramine , esomeprazole, finasteride, meloxicam, metoprolol  tartrate, polyethylene glycol, predniSONE , rosuvastatin , saw palmetto, tadalafil, tamsulosin , testosterone, thiamine , traMADol , traZODone , vitamin B-12, vitamin  E, and zinc gluconate  History Review  Allergy: Zachary Fernandez is allergic to other. Drug: Zachary Fernandez  reports no history of drug use. Alcohol:  reports current alcohol use. Tobacco:  reports that he has never smoked. He has never used smokeless tobacco. Social: Zachary Fernandez  reports that he has never smoked. He has never used smokeless tobacco. He reports current alcohol use. He reports that he does not use drugs. Medical:  has a past medical history of EtOH dependence (HCC), HLD (hyperlipidemia), and Hypertension. Surgical: Zachary Fernandez  has a past surgical history that includes Revision total hip arthroplasty (Bilateral); Knee Arthroplasty (Left); Shoulder surgery (Left); Cholecystectomy; and Hip Closed Reduction (Right, 12/12/2021). Family: family history includes Diabetes in his brother.  Laboratory Chemistry Profile   Renal Lab Results  Component Value Date   BUN 14 12/18/2022   CREATININE 0.77 12/18/2022    GFRNONAA >60 12/18/2022    Hepatic Lab Results  Component Value Date   AST 27 12/10/2022   ALT 21 12/10/2022   ALBUMIN 3.9 12/10/2022   ALKPHOS 92 12/10/2022    Electrolytes Lab Results  Component Value Date   NA 136 12/18/2022   K 3.8 12/18/2022   CL 100 12/18/2022   CALCIUM  9.2 12/18/2022   MG 1.9 12/16/2022   PHOS 3.9 12/16/2022    Bone Lab Results  Component Value Date   VD25OH 34.15 12/13/2022    Inflammation (CRP: Acute Phase) (ESR: Chronic Phase) Lab Results  Component Value Date   LATICACIDVEN 1.3 12/14/2022         Note: Above Lab results reviewed.  Recent Imaging Review  DG PAIN CLINIC C-ARM 1-60 MIN NO REPORT Fluoro was used, but no Radiologist interpretation will be provided.  Please refer to NOTES tab for provider progress note. Note: Reviewed        Physical Exam  Vitals: BP (!) 159/76   Pulse 65   Temp 98.2 F (36.8 C)   Resp 16   Ht 5' 4 (1.626 m)   Wt 158 lb (71.7 kg)   SpO2 99%   BMI 27.12 kg/m  BMI: Estimated body mass index is 27.12 kg/m as calculated from the following:   Height as of this encounter: 5' 4 (1.626 m).   Weight as of this encounter: 158 lb (71.7 kg). Ideal: Ideal body weight: 59.2 kg (130 lb 8.2 oz) Adjusted ideal body weight: 64.2 kg (141 lb 8.1 oz) General appearance: Well nourished, well developed, and well hydrated. In no apparent acute distress Mental status: Alert, oriented x 3 (person, place, & time)       Respiratory: No evidence of acute respiratory distress Eyes: PERLA  Bilateral SI joint pain Assessment   Diagnosis Status  1. SI (sacroiliac) joint dysfunction   2. Spinal stenosis, sacral and sacrococcygeal region   3. Chronic SI joint pain   4. Chronic radicular lumbar pain   5. Spinal stenosis, lumbar region, with neurogenic claudication   6. Chronic pain syndrome    Having a Flare-up Having a Flare-up Controlled   Updated Problems: No problems updated.  Plan of Care  Assessment and Plan     SI joint pain   SI joint pain worsened after recent sacral lateral branch nerve blocks. Although he previously responded well to similar treatment in [place] , the current procedure increased his pain. Start a prednisone  taper: 60 mg daily at breakfast for 3 days, then 40 mg daily for 3 days, followed by 20 mg daily for 3 days. Avoid  repeating sacral lateral branch nerve blocks due to increased pain.  Urinary frequency and leakage   Urinary frequency and leakage are likely due to sacral lateral branch nerve blocks affecting bladder muscle function. A negative UTI test rules out infection. Symptoms should improve as the bladder muscle recovers from the local anesthetic. Monitor for worsening smell, burning sensation, bladder distention, or urinary retention. Will monitor; expected to resolve spontaneously.    Pharmacotherapy (Medications Ordered): Meds ordered this encounter  Medications   predniSONE  (DELTASONE ) 20 MG tablet    Sig: Take 3 tablets (60 mg total) by mouth daily with breakfast for 3 days, THEN 2 tablets (40 mg total) daily with breakfast for 3 days, THEN 1 tablet (20 mg total) daily with breakfast for 3 days.    Dispense:  18 tablet    Refill:  0   Orders:  No orders of the defined types were placed in this encounter.    Interventional management options:  Considering: Occipital nerve block   C2-C3 medial branch nerve block Lumbar epidural steroid injection Lumbar facet medial branch nerve blocks S1-S3 lateral branch nerve blocks SI joint injection Sprint peripheral nerve stimulation   PRN Procedures:   None at this time          Return in about 6 weeks (around 09/06/2023) for follow up.    Recent Visits Date Type Provider Dept  07/18/23 Procedure visit Marcelino Nurse, MD [facility]  07/03/23 Office Visit Marcelino Nurse, MD [facility]  06/13/23 Procedure visit Marcelino Nurse, MD [facility]  06/12/23 Office Visit Marcelino Nurse, MD  [facility]  Showing recent visits within past 90 days and meeting all other requirements Today's Visits Date Type Provider Dept  07/26/23 Office Visit Marcelino Nurse, MD [facility]  Showing today's visits and meeting all other requirements Future Appointments Date Type Provider Dept  09/13/23 Appointment Patel, Seema K, NP [facility]  Showing future appointments within next 90 days and meeting all other requirements  I discussed the assessment and treatment plan with the patient. The patient was provided an opportunity to ask questions and all were answered. The patient agreed with the plan and demonstrated an understanding of the instructions.  Patient advised to call back or seek an in-person evaluation if the symptoms or condition worsens.  Duration of encounter: .  Total time on encounter, as per AMA guidelines included both the face-to-face and non-face-to-face time personally spent by the physician and/or other qualified health care professional(s) on the day of the encounter (includes time in activities that require the physician or other qualified health care professional and does not include time in activities normally performed by clinical staff). Physician's time may include the following activities when performed: Preparing to see the patient (e.g., pre-charting review of records, searching for previously ordered imaging, lab work, and nerve conduction tests) Review of prior analgesic pharmacotherapies. Reviewing PMP Interpreting ordered tests (e.g., lab work, imaging, nerve conduction tests) Performing post-procedure evaluations, including interpretation of diagnostic procedures Obtaining and/or reviewing separately obtained history Performing a medically appropriate examination and/or evaluation Counseling and educating the patient/family/caregiver Ordering medications, tests, or procedures Referring and communicating with other health  care professionals (when not separately reported) Documenting clinical information in the electronic or other health record Independently interpreting results (not separately reported) and communicating results to the patient/ family/caregiver Care coordination (not separately reported)  Note by: Nurse Marcelino, MD (TTS and AI technology used. I apologize for any typographical errors that  were not detected and corrected.) Date: 07/26/2023; Time: 9:17 AM

## 2023-07-26 NOTE — Progress Notes (Signed)
 Safety precautions to be maintained throughout the outpatient stay will include: orient to surroundings, keep bed in low position, maintain call bell within reach at all times, provide assistance with transfer out of bed and ambulation.

## 2023-08-02 ENCOUNTER — Other Ambulatory Visit: Payer: Self-pay

## 2023-08-02 ENCOUNTER — Encounter
Admission: RE | Disposition: A | Discharge status: Home / Self Care | Payer: Self-pay | Source: Home / Self Care | Attending: Gastroenterology

## 2023-08-02 ENCOUNTER — Ambulatory Visit
Admission: RE | Admit: 2023-08-02 | Discharge: 2023-08-02 | Disposition: A | Discharge status: Home / Self Care | Attending: Gastroenterology | Admitting: Gastroenterology

## 2023-08-02 ENCOUNTER — Ambulatory Visit: Admitting: Anesthesiology

## 2023-08-02 ENCOUNTER — Encounter: Payer: Self-pay | Admitting: Gastroenterology

## 2023-08-02 DIAGNOSIS — Z96643 Presence of artificial hip joint, bilateral: Secondary | ICD-10-CM | POA: Diagnosis not present

## 2023-08-02 DIAGNOSIS — K649 Unspecified hemorrhoids: Secondary | ICD-10-CM | POA: Diagnosis not present

## 2023-08-02 DIAGNOSIS — K222 Esophageal obstruction: Secondary | ICD-10-CM | POA: Diagnosis not present

## 2023-08-02 DIAGNOSIS — Z9049 Acquired absence of other specified parts of digestive tract: Secondary | ICD-10-CM | POA: Diagnosis not present

## 2023-08-02 DIAGNOSIS — I8510 Secondary esophageal varices without bleeding: Secondary | ICD-10-CM | POA: Diagnosis not present

## 2023-08-02 DIAGNOSIS — K7460 Unspecified cirrhosis of liver: Secondary | ICD-10-CM | POA: Diagnosis not present

## 2023-08-02 DIAGNOSIS — I10 Essential (primary) hypertension: Secondary | ICD-10-CM | POA: Insufficient documentation

## 2023-08-02 DIAGNOSIS — K760 Fatty (change of) liver, not elsewhere classified: Secondary | ICD-10-CM | POA: Insufficient documentation

## 2023-08-02 DIAGNOSIS — K5730 Diverticulosis of large intestine without perforation or abscess without bleeding: Secondary | ICD-10-CM | POA: Diagnosis not present

## 2023-08-02 HISTORY — PX: ESOPHAGOGASTRODUODENOSCOPY: SHX5428

## 2023-08-02 SURGERY — EGD (ESOPHAGOGASTRODUODENOSCOPY)
Anesthesia: General

## 2023-08-02 MED ORDER — PROPOFOL 10 MG/ML IV BOLUS
INTRAVENOUS | Status: DC | PRN
  Administered 2023-08-02: 60 mg via INTRAVENOUS

## 2023-08-02 MED ORDER — EPHEDRINE SULFATE (PRESSORS) 50 MG/ML IJ SOLN
INTRAMUSCULAR | Status: DC | PRN
  Administered 2023-08-02: 8 mg via INTRAVENOUS

## 2023-08-02 MED ORDER — LIDOCAINE HCL (CARDIAC) PF 100 MG/5ML IV SOSY
PREFILLED_SYRINGE | INTRAVENOUS | Status: DC | PRN
  Administered 2023-08-02: 60 mg via INTRAVENOUS

## 2023-08-02 MED ORDER — SODIUM CHLORIDE 0.9 % IV SOLN
INTRAVENOUS | Status: DC
  Administered 2023-08-02: 500 mL via INTRAVENOUS

## 2023-08-02 NOTE — Anesthesia Preprocedure Evaluation (Addendum)
 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; LAD; RBBB   Neuro/Psych Alcohol use disorder, pt denies daily or heavy use; chronic pain    GI/Hepatic negative GI ROS,,,(+) Cirrhosis         Endo/Other  Prediabetes   Renal/GU negative Renal ROS     Musculoskeletal  (+) Arthritis ,    Abdominal   Peds  Hematology negative hematology ROS (+)   Anesthesia Other Findings   Reproductive/Obstetrics                              Anesthesia Physical Anesthesia Plan  ASA: 3  Anesthesia Plan: General   Post-op Pain Management:    Induction: Intravenous  PONV Risk Score and Plan: 2 and Propofol  infusion, TIVA and Treatment may vary due to age or medical condition  Airway Management Planned: Natural Airway  Additional Equipment:   Intra-op Plan:   Post-operative Plan:   Informed Consent: I have reviewed the patients History and Physical, chart, labs and discussed the procedure including the risks, benefits and alternatives for the proposed anesthesia with the patient or authorized representative who has indicated his/her understanding and acceptance.       Plan Discussed with: CRNA  Anesthesia Plan Comments: (LMA/GETA backup discussed.  Patient consented for risks of anesthesia including but not limited to:  - adverse reactions to medications - damage to eyes, teeth, lips  or other oral mucosa - nerve damage due to positioning  - sore throat or hoarseness - damage to heart, brain, nerves, lungs, other parts of body or loss of life  Informed patient about role of CRNA in peri- and intra-operative care.  Patient voiced understanding.)         Anesthesia Quick Evaluation

## 2023-08-02 NOTE — Interval H&P Note (Signed)
 History and Physical Interval Note: Preprocedure H&P from 08/02/23  was reviewed and there was no interval change after seeing and examining the patient.  Written consent was obtained from the patient after discussion of risks, benefits, and alternatives. 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  The various methods of treatment have been discussed with the patient and family. After consideration of risks, benefits and other options for treatment, the patient has consented to  Procedure(s): EGD (ESOPHAGOGASTRODUODENOSCOPY) (N/A) as a surgical intervention.  The patient's history has been reviewed, patient examined, no change in status, stable for surgery.  I have reviewed the patient's chart and labs.  Questions were answered to the patient's satisfaction.     [name]

## 2023-08-02 NOTE — Op Note (Signed)
 [facility] Patient Name: Zachary Fernandez Procedure Date: 08/02/2023 11:31 AM MRN: [id number] Account #: [id number] Date of Birth: 1940/06/11 Admit Type: Outpatient Age: 83 Room: [facility] ENDO ROOM 1 Gender: Male Note Status: Finalized Instrument Name: Upper Endoscope [id number] Procedure:             Upper GI endoscopy Indications:           Variceal screening (no known varices or prior                         bleeding), Cirrhosis, Fatty Liver Disease Providers:             [name] DO, DO Medicines:             Monitored Anesthesia Care Complications:         No immediate complications. Estimated blood loss: None. Procedure:             Pre-Anesthesia Assessment:                        - Prior to the procedure, a History and Physical was                         performed, and patient medications and allergies were                         reviewed. The patient is competent. The risks and                         benefits of the procedure and the sedation options and                         risks were discussed with the patient. All questions                         were answered and informed consent was obtained.                         Patient identification and proposed procedure were                         verified by the physician, the nurse, the anesthetist                         and the technician in the endoscopy suite. Mental                         Status Examination: alert and oriented. Airway                         Examination: normal oropharyngeal airway and neck                         mobility. Respiratory Examination: clear to                         auscultation. CV Examination: RRR, no murmurs, no S3  or S4. Prophylactic Antibiotics: The patient does not                         require prophylactic antibiotics. Prior                         Anticoagulants: The patient has taken no anticoagulant                          or antiplatelet agents. ASA Grade Assessment: III - A                         patient with severe systemic disease. After reviewing                         the risks and benefits, the patient was deemed in                         satisfactory condition to undergo the procedure. The                         anesthesia plan was to use monitored anesthesia care                         (MAC). Immediately prior to administration of                         medications, the patient was re-assessed for adequacy                         to receive sedatives. The heart rate, respiratory                         rate, oxygen saturations, blood pressure, adequacy of                         pulmonary ventilation, and response to care were                         monitored throughout the procedure. The physical                         status of the patient was re-assessed after the                         procedure.                        After obtaining informed consent, the endoscope was                         passed under direct vision. Throughout the procedure,                         the patient's blood pressure, pulse, and oxygen                         saturations were monitored continuously. The Endoscope  was introduced through the mouth, and advanced to the                         second part of duodenum. The upper GI endoscopy was                         accomplished without difficulty. The patient tolerated                         the procedure well. Findings:      The duodenal bulb, first portion of the duodenum and second portion of       the duodenum were normal. Estimated blood loss: none.      The gastroesophageal flap valve was visualized endoscopically and       classified as Hill Grade II (fold present, opens with respiration).       Estimated blood loss: none.      The entire examined stomach was normal. Estimated blood loss: none.       Esophagogastric landmarks were identified: the gastroesophageal junction       was found at 38 cm from the incisors.      The Z-line was regular. Estimated blood loss: none.      A widely patent Schatzki ring was found at the gastroesophageal       junction. Estimated blood loss: none.      The exam of the esophagus was otherwise normal.      No signs of esophageal or gastric varices. No portal hypertensive       gastropathy or GAVE. Impression:            - Normal duodenal bulb, first portion of the duodenum                         and second portion of the duodenum.                        - Gastroesophageal flap valve classified as Hill Grade                         II (fold present, opens with respiration).                        - Normal stomach.                        - Esophagogastric landmarks identified.                        - Z-line regular.                        - Widely patent Schatzki ring.                        - No specimens collected. Recommendation:        - Patient has a contact number available for                         emergencies. The signs and symptoms of potential  delayed complications were discussed with the patient.                         Return to normal activities tomorrow. Written                         discharge instructions were provided to the patient.                        - Discharge patient to home.                        - Resume previous diet.                        - Continue present medications.                        - Repeat upper endoscopy if platelets descend below                         150,000 or Liver kPA >20.                        - Return to GI clinic as previously scheduled.                        - The findings and recommendations were discussed with                         the patient. Procedure Code(s):     --- Professional ---                        [phone], Esophagogastroduodenoscopy, flexible,                          transoral; diagnostic, including collection of                         specimen(s) by brushing or washing, when performed                         (separate procedure) Diagnosis Code(s):     --- Professional ---                        K22.2, Esophageal obstruction                        Z13.810, Encounter for screening for upper                         gastrointestinal disorder CPT copyright 2022 [employer]. All rights reserved. The codes documented in this report are preliminary and upon coder review may  be revised to meet current compliance requirements. Attending Participation:      I personally performed the entire procedure. Elspeth Jungling, DO [name] DO, DO 08/02/2023 12:44:41 PM This report has been signed electronically. Number of Addenda: 0 Note Initiated On: 08/02/2023 11:31 AM Estimated Blood Loss:  Estimated blood loss: none.      [facility]

## 2023-08-02 NOTE — Anesthesia Postprocedure Evaluation (Signed)
 Anesthesia Post Note  Patient: Zachary Fernandez  Procedure(s) Performed: EGD (ESOPHAGOGASTRODUODENOSCOPY)  Patient location during evaluation: PACU Anesthesia Type: General Level of consciousness: awake and alert, oriented and patient cooperative Pain management: pain level controlled Vital Signs Assessment: post-procedure vital signs reviewed and stable Respiratory status: spontaneous breathing, nonlabored ventilation and respiratory function stable Cardiovascular status: blood pressure returned to baseline and stable Postop Assessment: adequate PO intake Anesthetic complications: no   No notable events documented.   Last Vitals:  Vitals:   08/02/23 1241 08/02/23 1251  BP: 121/68 (!) 142/77  Pulse: 72 66  Resp: 16 15  Temp: (!) 35.8 C   SpO2: 94% 97%    Last Pain:  Vitals:   08/02/23 1251  TempSrc:   PainSc: 0-No pain                 Alfonso Ruths

## 2023-08-02 NOTE — H&P (Signed)
 Pre-Procedure H&P   Patient ID: Zachary Fernandez is a 83 y.o. male.  Gastroenterology Provider: [name], DO  Referring Provider: Romero Antigua, PA PCP: Epifanio Alm SQUIBB, MD  Date: 08/02/2023  HPI Zachary Fernandez is a 83 y.o. male who presents today for Esophagogastroduodenoscopy for Cirrhosis; esophageal varices screening.  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  Bilateral hip replacement  Currently on Nexium and Mobic.  Status post cholecystectomy   Creatinine 0.7 hemoglobin 14.3 MCV 94 platelets 148,000 INR 1.2  Last underwent EGD and colonoscopy in March 2020 Diverticulosis with 1 adenomatous polyp.  Biopsies negative for microscopic colitis.  Hemorrhoids also demonstrated Normal esophagus, H. pylori negative gastritis   Past Medical History:  Diagnosis Date   EtOH dependence (HCC)    HLD (hyperlipidemia)    Hypertension     Past Surgical History:  Procedure Laterality Date   CHOLECYSTECTOMY     HIP CLOSED REDUCTION Right 12/12/2021   Procedure: CLOSED REDUCTION HIP;  Surgeon: Rollene Cough, MD;  Location: [facility] ORS;  Service: Orthopedics;  Laterality: Right;   KNEE ARTHROPLASTY Left    REVISION TOTAL HIP ARTHROPLASTY Bilateral    SHOULDER SURGERY Left     Family History Father- colon polyps No other h/o GI disease or malignancy  Review of Systems  Constitutional:  Negative for activity change, appetite change, chills, diaphoresis, fatigue, fever and unexpected weight change.  HENT:  Negative for trouble swallowing and voice change.   Respiratory:  Negative for shortness of breath and wheezing.   Cardiovascular:  Negative for chest pain, palpitations and leg swelling.  Gastrointestinal:  Negative for abdominal distention, abdominal pain, anal bleeding, blood in stool, constipation, diarrhea, nausea and vomiting.  Musculoskeletal:  Negative for arthralgias and myalgias.  Skin:  Negative for color change and pallor.   Neurological:  Negative for dizziness, syncope and weakness.  Psychiatric/Behavioral:  Negative for confusion. The patient is not nervous/anxious.   All other systems reviewed and are negative.    Medications No current facility-administered medications on file prior to encounter.   Current Outpatient Medications on File Prior to Encounter  Medication Sig Dispense Refill   acetaminophen  (TYLENOL ) 325 MG tablet Take 2 tablets (650 mg total) by mouth every 6 (six) hours as needed for mild pain (pain score 1-3), fever or headache.     Calcium  Citrate (CITRACAL PO) Take 650 mg by mouth. 2 tabs in am     desipramine  (NORPRAMIN ) 25 MG tablet Take 25 mg by mouth at bedtime.     esomeprazole (NEXIUM) 20 MG capsule Take 20 mg by mouth daily at 12 noon.     finasteride (PROSCAR) 5 MG tablet Take 5 mg by mouth daily.     Krill Oil 500 MG CAPS Take by mouth.     meloxicam (MOBIC) 7.5 MG tablet Take 7.5 mg by mouth daily.     metoprolol  tartrate (LOPRESSOR ) 25 MG tablet Take 0.5 tablets (12.5 mg total) by mouth 2 (two) times daily.     Multiple Vitamins-Minerals (CENTRUM SILVER PO) Take 1 tablet by mouth daily.     rosuvastatin  (CRESTOR ) 20 MG tablet Take 20 mg by mouth daily.     tamsulosin  (FLOMAX ) 0.4 MG CAPS capsule Take 0.4 mg by mouth daily.     testosterone (ANDROGEL) 50 MG/5GM (1%) GEL Place 5 g onto the skin daily.     thiamine  (VITAMIN B-1) 100 MG tablet Take 1 tablet (100 mg total) by mouth daily.  traZODone  (DESYREL ) 50 MG tablet Take 1 tablet (50 mg total) by mouth at bedtime as needed for sleep.     TURMERIC PO Take 1 tablet by mouth daily.     vitamin B-12 (CYANOCOBALAMIN ) 500 MCG tablet Take 500 mcg by mouth daily.     vitamin E 180 MG (400 UNITS) capsule Take 670 Units by mouth daily.     XDEMVY 0.25 % SOLN Place 1 drop into both eyes 2 (two) times daily as needed (dry eye).     zinc gluconate 50 MG tablet Take 50 mg by mouth daily.     carbamide peroxide (GLY-OXIDE) 10 %  solution Place 1 Application onto teeth. (Patient not taking: Reported on 08/02/2023)     polyethylene glycol (MIRALAX  / GLYCOLAX ) 17 g packet Take 17 g by mouth 2 (two) times daily.     Propylene Glycol (SYSTANE COMPLETE PF OP) Apply to eye.     saw palmetto 160 MG capsule Take 160 mg by mouth 2 (two) times daily.     tadalafil (CIALIS) 20 MG tablet Take 20 mg by mouth daily as needed for erectile dysfunction.     traMADol  (ULTRAM ) 50 MG tablet Take 1 tablet (50 mg total) by mouth every 6 (six) hours as needed for severe pain (pain score 7-10). 120 tablet 2   vitamin E 180 MG (400 UNITS) capsule Take 400 Units by mouth daily.      Pertinent medications related to GI and procedure were reviewed by me with the patient prior to the procedure   Current Facility-Administered Medications:    0.9 %  sodium chloride  infusion, , Intravenous, Continuous, Onita Elspeth Sharper, DO, Last Rate: 20 mL/hr at 08/02/23 1135, 500 mL at 08/02/23 1135  sodium chloride  500 mL (08/02/23 1135)       Allergies  Allergen Reactions   Other Itching, Photosensitivity and Anaphylaxis    Hazelnut, black walnuts, brazilian nuts   Allergies were reviewed by me prior to the procedure  Objective   Body mass index is 26.59 kg/m. Vitals:   08/02/23 1126  BP: (!) 150/81  Pulse: 71  Resp: 14  Temp: (!) 96.7 F (35.9 C)  TempSrc: Tympanic  SpO2: 100%  Weight: 72.5 kg  Height: 5' 5 (1.651 m)     Physical Exam Vitals and nursing note reviewed.  Constitutional:      General: He is not in acute distress.    Appearance: Normal appearance. He is not ill-appearing, toxic-appearing or diaphoretic.  HENT:     Head: Normocephalic and atraumatic.     Nose: Nose normal.     Mouth/Throat:     Mouth: Mucous membranes are moist.     Pharynx: Oropharynx is clear.  Eyes:     General: No scleral icterus.    Extraocular Movements: Extraocular movements intact.  Cardiovascular:     Rate and Rhythm: Normal rate and  regular rhythm.     Heart sounds: Normal heart sounds. No murmur heard.    No friction rub. No gallop.  Pulmonary:     Effort: Pulmonary effort is normal. No respiratory distress.     Breath sounds: Normal breath sounds. No wheezing, rhonchi or rales.  Abdominal:     General: Abdomen is flat. Bowel sounds are normal. There is no distension.     Palpations: Abdomen is soft.     Tenderness: There is no abdominal tenderness. There is no guarding or rebound.  Musculoskeletal:     Cervical back: Neck supple.  Right lower leg: No edema.     Left lower leg: No edema.  Skin:    General: Skin is warm and dry.     Coloration: Skin is not jaundiced or pale.  Neurological:     General: No focal deficit present.     Mental Status: He is alert and oriented to person, place, and time. Mental status is at baseline.  Psychiatric:        Mood and Affect: Mood normal.        Behavior: Behavior normal.        Thought Content: Thought content normal.        Judgment: Judgment normal.      Assessment:  Zachary Fernandez is a 83 y.o. male  who presents today for Esophagogastroduodenoscopy for Cirrhosis; esophageal varices screening .  Plan:  Esophagogastroduodenoscopy with possible intervention today  Esophagogastroduodenoscopy with possible biopsy, control of bleeding, polypectomy, and interventions as necessary has been discussed with the patient/patient representative. Informed consent was obtained from the patient/patient representative after explaining the indication, nature, and risks of the procedure including but not limited to death, bleeding, perforation, missed neoplasm/lesions, cardiorespiratory compromise, and reaction to medications. Opportunity for questions was given and appropriate answers were provided. Patient/patient representative has verbalized understanding is amenable to undergoing the procedure.   [name], DO  [facility] Gastroenterology  Portions of  the record may have been created with voice recognition software. Occasional wrong-word or 'sound-a-like' substitutions may have occurred due to the inherent limitations of voice recognition software.  Read the chart carefully and recognize, using context, where substitutions may have occurred.

## 2023-08-02 NOTE — Transfer of Care (Signed)
 Immediate Anesthesia Transfer of Care Note  Patient: Zachary Fernandez  Procedure(s) Performed: EGD (ESOPHAGOGASTRODUODENOSCOPY)  Patient Location: PACU  Anesthesia Type:General  Level of Consciousness: awake, alert , and oriented  Airway & Oxygen Therapy: Patient Spontanous Breathing  Post-op Assessment: Report given to RN and Post -op Vital signs reviewed and stable  Post vital signs: Reviewed and stable  Last Vitals:  Vitals Value Taken Time  BP    Temp    Pulse    Resp    SpO2      Last Pain:  Vitals:   08/02/23 1126  TempSrc: Tympanic  PainSc: 0-No pain      Patients Stated Pain Goal: 6 (08/02/23 1126)  Complications: No notable events documented.

## 2023-08-03 ENCOUNTER — Encounter: Payer: Self-pay | Admitting: Gastroenterology

## 2023-08-15 ENCOUNTER — Telehealth: Admitting: Student in an Organized Health Care Education/Training Program

## 2023-08-21 ENCOUNTER — Ambulatory Visit: Admitting: Student in an Organized Health Care Education/Training Program

## 2023-09-06 ENCOUNTER — Ambulatory Visit: Admitting: Student in an Organized Health Care Education/Training Program

## 2023-09-13 ENCOUNTER — Ambulatory Visit: Attending: Nurse Practitioner | Admitting: Nurse Practitioner

## 2023-09-13 ENCOUNTER — Encounter: Payer: Self-pay | Admitting: Nurse Practitioner

## 2023-09-13 VITALS — BP 164/92 | Temp 97.9°F | Resp 20 | Ht 66.0 in | Wt 160.0 lb

## 2023-09-13 DIAGNOSIS — M5416 Radiculopathy, lumbar region: Secondary | ICD-10-CM | POA: Insufficient documentation

## 2023-09-13 DIAGNOSIS — M47816 Spondylosis without myelopathy or radiculopathy, lumbar region: Secondary | ICD-10-CM | POA: Diagnosis present

## 2023-09-13 DIAGNOSIS — M4726 Other spondylosis with radiculopathy, lumbar region: Secondary | ICD-10-CM

## 2023-09-13 DIAGNOSIS — S2241XS Multiple fractures of ribs, right side, sequela: Secondary | ICD-10-CM | POA: Diagnosis present

## 2023-09-13 DIAGNOSIS — G894 Chronic pain syndrome: Secondary | ICD-10-CM | POA: Diagnosis not present

## 2023-09-13 DIAGNOSIS — M533 Sacrococcygeal disorders, not elsewhere classified: Secondary | ICD-10-CM | POA: Diagnosis not present

## 2023-09-13 DIAGNOSIS — G588 Other specified mononeuropathies: Secondary | ICD-10-CM | POA: Diagnosis not present

## 2023-09-13 DIAGNOSIS — G8929 Other chronic pain: Secondary | ICD-10-CM | POA: Diagnosis present

## 2023-09-13 DIAGNOSIS — Z79899 Other long term (current) drug therapy: Secondary | ICD-10-CM | POA: Diagnosis present

## 2023-09-13 DIAGNOSIS — Z96653 Presence of artificial knee joint, bilateral: Secondary | ICD-10-CM | POA: Diagnosis present

## 2023-09-13 MED ORDER — TRAMADOL HCL 50 MG PO TABS: 50.0000 mg | ORAL_TABLET | Freq: Four times a day (QID) | ORAL | 2 refills | Status: DC | PRN

## 2023-09-13 NOTE — Progress Notes (Signed)
 PROVIDER NOTE: Interpretation of information contained herein should be left to medically-trained personnel. Specific patient instructions are provided elsewhere under Patient Instructions section of medical record. This document was created in part using AI and STT-dictation technology, any transcriptional errors that may result from this process are unintentional.  Patient: Zachary Fernandez  Service: E/M   PCP: Epifanio Alm SQUIBB, MD  DOB: Jul 15, 1940  DOS: 09/13/2023  Provider: Emmy MARLA Blanch, NP  MRN: [id number]  Delivery: Face-to-face  Specialty: Interventional Pain Management  Type: Established Patient  Setting: Ambulatory outpatient facility  Specialty designation: 09  Referring Prov.: Epifanio Alm SQUIBB, MD  Location: Outpatient office facility       History of present illness (HPI) [name], a 83 y.o. year old male, is here today because of his Chronic pain syndrome [G89.4]. [name] today is Back Pain  Pertinent problems: Zachary Fernandez has Chronic SI joint pain; Status post hip replacement, bilateral; History of bilateral knee replacement; Spinal stenosis, lumbar region, with neurogenic claudication; Chronic radicular lumbar pain; Lumbar spondylosis; Chronic pain syndrome; Pain management contract signed; Whiplash injury to neck; and Closed nondisplaced fracture of second cervical vertebra (HCC) on their pertinent problem list.   Pain Assessment: Severity of Chronic pain is reported as a 0-No pain/10. Location: Back Lower/Denies. Onset: More than a month ago. Quality:  . Timing: Intermittent. Modifying factor(s): Procedure. Vitals:  height is 5' 6 (1.676 m) and weight is 160 lb (72.6 kg). His temporal temperature is 97.9 F (36.6 C). His blood pressure is 164/92 (abnormal). His respiration is 20.  BMI: Estimated body mass index is 25.82 kg/m as calculated from the following:   Height as of this encounter: 5' 6 (1.676 m).   Weight as of this encounter: 160 lb (72.6  kg).  Last encounter: 07/26/2023 Last procedure: 07/18/2023  Reason for encounter: medication management. No change in medical history since last visit.  Patient's pain is at baseline.  Patient continues multimodal pain regimen as prescribed.  States that it provides pain relief and improvement in functional status.  The patient continues to experience chronic low back pain; however, he remains active, attending the gym every other day and maintaining good functional mobility.  Pharmacotherapy Assessment   Tramadol  (Ultram ) 50 mg tablet every 6 hours as needed for pain. MME=40 Monitoring: [place] PMP: PDMP reviewed during this encounter.       Pharmacotherapy: No side-effects or adverse reactions reported. Compliance: No problems identified. Effectiveness: Clinically acceptable.  Erlene Doyal SAUNDERS, [place]  09/13/2023 11:29 AM  Sign when Signing Visit Nursing Pain Medication Assessment:  Safety precautions to be maintained throughout the outpatient stay will include: orient to surroundings, keep bed in low position, maintain call bell within reach at all times, provide assistance with transfer out of bed and ambulation.  Medication Inspection Compliance: Zachary Fernandez did not comply with our request to bring his pills to be counted. He was reminded that bringing the medication bottles, even when empty, is a requirement.  Medication: None brought in. Pill/Patch Count: None available to be counted. Bottle Appearance: No container available. Did not bring bottle(s) to appointment. Filled Date: N/A Last Medication intake:  Today    UDS:  Summary  Date Value Ref Range Status  02/16/2020 Note  Final    Comment:    ==================================================================== Compliance Drug Analysis, Ur ==================================================================== Test  Result       Flag       Units  Drug Present and Declared for Prescription Verification    Tramadol                        >3247        EXPECTED   ng/mg creat   O-Desmethyltramadol            >3247        EXPECTED   ng/mg creat   N-Desmethyltramadol            964          EXPECTED   ng/mg creat    Source of tramadol  is a prescription medication. O-desmethyltramadol    and N-desmethyltramadol are expected metabolites of tramadol .    Desipramine                     PRESENT      EXPECTED    Desipramine  may be administered as a prescription drug; it is also    an expected metabolite of imipramine.  Drug Absent but Declared for Prescription Verification   Gabapentin                      Not Detected UNEXPECTED ==================================================================== Test                      Result    Flag   Units      Ref Range   Creatinine              154              mg/dL      >=79 ==================================================================== Declared Medications:  The flagging and interpretation on this report are based on the  following declared medications.  Unexpected results may arise from  inaccuracies in the declared medications.   **Note: The testing scope of this panel includes these medications:   Desipramine  (Norpramin )  Gabapentin  (Neurontin )  Tramadol  (Ultram )   **Note: The testing scope of this panel does not include the  following reported medications:   Amlodipine  (Norvasc )  Bisoprolol   Calcium   Hydrochlorothiazide   Multivitamin  Rosuvastatin  (Crestor )  Supplement  Tamsulosin  (Flomax )  Testosterone  Turmeric ==================================================================== For clinical consultation, please call [phone]. ====================================================================     No results found for: CBDTHCR No results found for: D8THCCBX No results found for: D9THCCBX  ROS  Constitutional: Denies any fever or chills Gastrointestinal: No reported hemesis, hematochezia, vomiting, or acute GI  distress Musculoskeletal: Low back pain Neurological: No reported episodes of acute onset apraxia, aphasia, dysarthria, agnosia, amnesia, paralysis, loss of coordination, or loss of consciousness  Medication Review  Calcium  Citrate, Krill Oil, Lotilaner, Multiple Vitamins-Minerals, Propylene Glycol, Turmeric, acetaminophen , desipramine , esomeprazole, finasteride, meloxicam, metoprolol  tartrate, polyethylene glycol, rosuvastatin , saw palmetto, tadalafil, tamsulosin , testosterone, thiamine , traMADol , traZODone , vitamin B-12, vitamin E, and zinc gluconate  History Review  Allergy: Zachary Fernandez is allergic to other. Drug: Zachary Fernandez  reports no history of drug use. Alcohol:  reports current alcohol use. Tobacco:  reports that he has never smoked. He has never used smokeless tobacco. Social: Zachary Fernandez  reports that he has never smoked. He has never used smokeless tobacco. He reports current alcohol use. He reports that he does not use drugs. Medical:  has a past medical history of EtOH dependence (HCC), HLD (hyperlipidemia), and Hypertension. Surgical: Zachary Fernandez  has a past surgical history that  includes Revision total hip arthroplasty (Bilateral); Knee Arthroplasty (Left); Shoulder surgery (Left); Cholecystectomy; Hip Closed Reduction (Right, 12/12/2021); and Esophagogastroduodenoscopy (N/A, 08/02/2023). Family: family history includes Diabetes in his brother.  Laboratory Chemistry Profile   Renal Lab Results  Component Value Date   BUN 14 12/18/2022   CREATININE 0.77 12/18/2022   GFRNONAA >60 12/18/2022    Hepatic Lab Results  Component Value Date   AST 27 12/10/2022   ALT 21 12/10/2022   ALBUMIN 3.9 12/10/2022   ALKPHOS 92 12/10/2022    Electrolytes Lab Results  Component Value Date   NA 136 12/18/2022   K 3.8 12/18/2022   CL 100 12/18/2022   CALCIUM  9.2 12/18/2022   MG 1.9 12/16/2022   PHOS 3.9 12/16/2022    Bone Lab Results  Component Value Date   VD25OH 34.15 12/13/2022     Inflammation (CRP: Acute Phase) (ESR: Chronic Phase) Lab Results  Component Value Date   LATICACIDVEN 1.3 12/14/2022         Note: Above Lab results reviewed.  Recent Imaging Review  DG PAIN CLINIC C-ARM 1-60 MIN NO REPORT Fluoro was used, but no Radiologist interpretation will be provided.  Please refer to NOTES tab for provider progress note. Note: Reviewed        Physical Exam  Vitals: BP (!) 164/92   Temp 97.9 F (36.6 C) (Temporal)   Resp 20   Ht 5' 6 (1.676 m)   Wt 160 lb (72.6 kg)   BMI 25.82 kg/m  BMI: Estimated body mass index is 25.82 kg/m as calculated from the following:   Height as of this encounter: 5' 6 (1.676 m).   Weight as of this encounter: 160 lb (72.6 kg). Ideal: Ideal body weight: 63.8 kg (140 lb 10.5 oz) Adjusted ideal body weight: 67.3 kg (148 lb 6.3 oz) General appearance: Well nourished, well developed, and well hydrated. In no apparent acute distress Mental status: Alert, oriented x 3 (person, place, & time)       Respiratory: No evidence of acute respiratory distress Eyes: PERLA   Assessment   Diagnosis Status  1. Chronic pain syndrome   2. Chronic SI joint pain   3. Chronic radicular lumbar pain   4. SI (sacroiliac) joint dysfunction   5. Lumbar spondylosis   6. Cluneal neuropathy   7. Closed fracture of multiple ribs of right side, sequela   8. History of bilateral knee replacement   9. Medication management    Controlled Controlled Controlled   Updated Problems: No problems updated.  Plan of Care  Problem-specific:  Assessment and Plan Will continue on current medication regimen.  Prescribing drug monitoring (PDMP) reviewed; findings consistent with the use of prescribed medication and no evidence of narcotic misuse or abuse. Routine UDS ordered today.  No other new issues or concerns reported at this visit.  Schedule follow-up in 90 days for medication management.  [name] has a current medication list which  includes the following long-term medication(s): calcium  citrate, desipramine , esomeprazole, metoprolol  tartrate, rosuvastatin , tadalafil, and trazodone .  Pharmacotherapy (Medications Ordered): Meds ordered this encounter  Medications   traMADol  (ULTRAM ) 50 MG tablet    Sig: Take 1 tablet (50 mg total) by mouth every 6 (six) hours as needed for severe pain (pain score 7-10).    Dispense:  120 tablet    Refill:  2    Fill two day early if pharmacy is closed on scheduled refill date.   Orders:  Orders Placed This Encounter  Procedures   ToxASSURE Select 13 (MW), Urine    Volume: 30 ml(s). Minimum 3 ml of urine is needed. Document temperature of fresh sample. Indications: Long term (current) use of opiate analgesic (S20.108)    Release to patient:   Immediate      Return in about 3 months (around 12/13/2023) for (F2F), (MM), Emmy Blanch NP.    Recent Visits Date Type Provider Dept  07/26/23 Office Visit Marcelino Nurse, MD [facility]  07/18/23 Procedure visit Marcelino Nurse, MD [facility]  07/03/23 Office Visit Marcelino Nurse, MD [facility]  Showing recent visits within past 90 days and meeting all other requirements Today's Visits Date Type Provider Dept  09/13/23 Office Visit Yesena Reaves K, NP [facility]  Showing today's visits and meeting all other requirements Future Appointments Date Type Provider Dept  12/11/23 Appointment Uno Esau K, NP [facility]  Showing future appointments within next 90 days and meeting all other requirements  I discussed the assessment and treatment plan with the patient. The patient was provided an opportunity to ask questions and all were answered. The patient agreed with the plan and demonstrated an understanding of the instructions.  Patient advised to call back or seek an in-person evaluation if the symptoms or condition worsens.  Duration of encounter: 30 minutes.  Total time on  encounter, as per AMA guidelines included both the face-to-face and non-face-to-face time personally spent by the physician and/or other qualified health care professional(s) on the day of the encounter (includes time in activities that require the physician or other qualified health care professional and does not include time in activities normally performed by clinical staff). Physician's time may include the following activities when performed: Preparing to see the patient (e.g., pre-charting review of records, searching for previously ordered imaging, lab work, and nerve conduction tests) Review of prior analgesic pharmacotherapies. Reviewing PMP Interpreting ordered tests (e.g., lab work, imaging, nerve conduction tests) Performing post-procedure evaluations, including interpretation of diagnostic procedures Obtaining and/or reviewing separately obtained history Performing a medically appropriate examination and/or evaluation Counseling and educating the patient/family/caregiver Ordering medications, tests, or procedures Referring and communicating with other health care professionals (when not separately reported) Documenting clinical information in the electronic or other health record Independently interpreting results (not separately reported) and communicating results to the patient/ family/caregiver Care coordination (not separately reported)  Note by: Sebastiana Wuest K Estanislao Harmon, NP (TTS and AI technology used. I apologize for any typographical errors that were not detected and corrected.) Date: 09/13/2023; Time: 12:33 PM

## 2023-09-13 NOTE — Progress Notes (Signed)
Nursing Pain Medication Assessment:  Safety precautions to be maintained throughout the outpatient stay will include: orient to surroundings, keep bed in low position, maintain call bell within reach at all times, provide assistance with transfer out of bed and ambulation.  Medication Inspection Compliance: Mr. Shenberger did not comply with our request to bring his pills to be counted. He was reminded that bringing the medication bottles, even when empty, is a requirement.  Medication: None brought in. Pill/Patch Count: None available to be counted. Bottle Appearance: No container available. Did not bring bottle(s) to appointment. Filled Date: N/A Last Medication intake:  Today 

## 2023-09-18 LAB — TOXASSURE SELECT 13 (MW), URINE

## 2023-09-22 ENCOUNTER — Emergency Department

## 2023-09-22 ENCOUNTER — Emergency Department
Admission: EM | Admit: 2023-09-22 | Discharge: 2023-09-22 | Disposition: A | Discharge status: Home / Self Care | Attending: Emergency Medicine | Admitting: Emergency Medicine

## 2023-09-22 ENCOUNTER — Other Ambulatory Visit: Payer: Self-pay

## 2023-09-22 DIAGNOSIS — S0990XA Unspecified injury of head, initial encounter: Secondary | ICD-10-CM | POA: Diagnosis present

## 2023-09-22 DIAGNOSIS — W01198A Fall on same level from slipping, tripping and stumbling with subsequent striking against other object, initial encounter: Secondary | ICD-10-CM | POA: Diagnosis not present

## 2023-09-22 DIAGNOSIS — F1090 Alcohol use, unspecified, uncomplicated: Secondary | ICD-10-CM | POA: Diagnosis not present

## 2023-09-22 DIAGNOSIS — S0101XA Laceration without foreign body of scalp, initial encounter: Secondary | ICD-10-CM | POA: Diagnosis not present

## 2023-09-22 DIAGNOSIS — G894 Chronic pain syndrome: Secondary | ICD-10-CM | POA: Insufficient documentation

## 2023-09-22 DIAGNOSIS — Y901 Blood alcohol level of 20-39 mg/100 ml: Secondary | ICD-10-CM | POA: Insufficient documentation

## 2023-09-22 DIAGNOSIS — I4891 Unspecified atrial fibrillation: Secondary | ICD-10-CM | POA: Diagnosis not present

## 2023-09-22 DIAGNOSIS — W19XXXA Unspecified fall, initial encounter: Secondary | ICD-10-CM

## 2023-09-22 LAB — CBC WITH DIFFERENTIAL/PLATELET
Abs Immature Granulocytes: 0.05 K/uL (ref 0.00–0.07)
Basophils Absolute: 0.1 K/uL (ref 0.0–0.1)
Basophils Relative: 1 %
Eosinophils Absolute: 0.4 K/uL (ref 0.0–0.5)
Eosinophils Relative: 4 %
HCT: 44 % (ref 39.0–52.0)
Hemoglobin: 14.3 g/dL (ref 13.0–17.0)
Immature Granulocytes: 1 %
Lymphocytes Relative: 20 %
Lymphs Abs: 2.1 K/uL (ref 0.7–4.0)
MCH: 31.4 pg (ref 26.0–34.0)
MCHC: 32.5 g/dL (ref 30.0–36.0)
MCV: 96.5 fL (ref 80.0–100.0)
Monocytes Absolute: 1 K/uL (ref 0.1–1.0)
Monocytes Relative: 10 %
Neutro Abs: 6.5 K/uL (ref 1.7–7.7)
Neutrophils Relative %: 64 %
Platelets: 156 K/uL (ref 150–400)
RBC: 4.56 MIL/uL (ref 4.22–5.81)
RDW: 13.9 % (ref 11.5–15.5)
WBC: 10.1 K/uL (ref 4.0–10.5)
nRBC: 0 % (ref 0.0–0.2)

## 2023-09-22 LAB — ETHANOL: Alcohol, Ethyl (B): 32 mg/dL — ABNORMAL HIGH (ref <15)

## 2023-09-22 LAB — COMPREHENSIVE METABOLIC PANEL WITH GFR
ALT: 21 U/L (ref 0–44)
AST: 26 U/L (ref 15–41)
Albumin: 3.8 g/dL (ref 3.5–5.0)
Alkaline Phosphatase: 79 U/L (ref 38–126)
Anion gap: 16 — ABNORMAL HIGH (ref 5–15)
BUN: 18 mg/dL (ref 8–23)
CO2: 21 mmol/L — ABNORMAL LOW (ref 22–32)
Calcium: 9 mg/dL (ref 8.9–10.3)
Chloride: 104 mmol/L (ref 98–111)
Creatinine, Ser: 0.78 mg/dL (ref 0.61–1.24)
GFR, Estimated: >60 mL/min (ref >60)
Glucose, Bld: 120 mg/dL — ABNORMAL HIGH (ref 70–99)
Potassium: 4.3 mmol/L (ref 3.5–5.1)
Sodium: 141 mmol/L (ref 135–145)
Total Bilirubin: 0.7 mg/dL (ref 0.0–1.2)
Total Protein: 6.3 g/dL — ABNORMAL LOW (ref 6.5–8.1)

## 2023-09-22 LAB — TROPONIN I (HIGH SENSITIVITY)
Troponin I (High Sensitivity): 10 ng/L (ref <18)
Troponin I (High Sensitivity): 14 ng/L (ref <18)

## 2023-09-22 LAB — MAGNESIUM: Magnesium: 1.8 mg/dL (ref 1.7–2.4)

## 2023-09-22 MED ORDER — METOPROLOL TARTRATE 25 MG PO TABS
25.0000 mg | ORAL_TABLET | Freq: Once | ORAL | Status: AC
Start: 2023-09-22 — End: 2023-09-22
  Administered 2023-09-22: 25 mg via ORAL
  Filled 2023-09-22: qty 1

## 2023-09-22 MED ORDER — METOPROLOL TARTRATE 25 MG PO TABS
25.0000 mg | ORAL_TABLET | Freq: Two times a day (BID) | ORAL | 2 refills | Status: AC
Start: 2023-09-22 — End: 2024-09-21

## 2023-09-22 MED ORDER — BACITRACIN ZINC 500 UNIT/GM EX OINT
TOPICAL_OINTMENT | Freq: Once | CUTANEOUS | Status: AC
  Administered 2023-09-22: 1 via TOPICAL
  Filled 2023-09-22: qty 0.9

## 2023-09-22 NOTE — ED Provider Notes (Signed)
 [facility] Provider Note    Event Date/Time   First MD Initiated Contact with Patient 09/22/23 1502     (approximate)   History   Fall   HPI  Zachary Fernandez is a 83 y.o. male who presents to the ED for evaluation of Fall   I reviewed PCP visit from last month.  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  GERD, HLD.  Previous ethanol abuse  Patient presents alongside his wife for evaluation of a mechanical fall and head laceration.  Reports he was at his baseline, up on the bed of a truck doing something when he slipped and fell backwards and struck his head.  No syncope or other trauma.  Reports laceration to the occiput and profuse bleeding prehospital that has since subsided.  Reluctantly admits to drinking again.  Reports regular ethanol use over the past month or so  Notably he has new onset A-fib.  Reports no palpitations, chest discomfort, shortness of breath, dizziness or syncopal episodes.  We have long discussion at the outset about A-fib, possible anticoagulation and risk of stroke, rate and rhythm control, cardiology follow-up and management of A-fib.    From the outset him and his wife are in agreement that he would not want to start anticoagulation at this time, acknowledging risks of stroke.  Primarily because he has started drinking again and is much less steady on his feet .    Physical Exam   Triage Vital Signs: ED Triage Vitals  Encounter Vitals Group     BP 09/22/23 1423 120/78     Girls Systolic BP Percentile --      Girls Diastolic BP Percentile --      Boys Systolic BP Percentile --      Boys Diastolic BP Percentile --      Pulse Rate 09/22/23 1423 (!) 142     Resp 09/22/23 1423 19     Temp 09/22/23 1423 97.8 F (36.6 C)     Temp src --      SpO2 09/22/23 1423 95 %     Weight 09/22/23 1424 160 lb (72.6 kg)     Height 09/22/23 1424 5' 6 (1.676 m)     Head Circumference --      Peak Flow --      Pain Score  09/22/23 1424 1     Pain Loc --      Pain Education --      Exclude from Growth Chart --     Most recent vital signs: Vitals:   09/22/23 1636 09/22/23 1800  BP:  (!) 147/89  Pulse: 68 67  Resp: 19 18  Temp:    SpO2: 95% 96%    General: Awake, no distress.  CV:  Good peripheral perfusion.  A-fib on the monitor, rate variable 80s-130s Resp:  Normal effort.  Abd:  No distention.  MSK:  No deformity noted.  Palpation of all 4 extremities otherwise without evidence of deformity, tenderness or trauma Neuro:  No focal deficits appreciated. Other:  Hemostatic subcutaneous laceration to the occipital scalp about 4 cm in length   ED Results / Procedures / Treatments   Labs (all labs ordered are listed, but only abnormal results are displayed) Labs Reviewed  COMPREHENSIVE METABOLIC PANEL WITH GFR - Abnormal; Notable for the following components:      Result Value   CO2 21 (*)    Glucose, Bld 120 (*)    Total Protein 6.3 (*)  Anion gap 16 (*)    All other components within normal limits  ETHANOL - Abnormal; Notable for the following components:   Alcohol, Ethyl (B) 32 (*)    All other components within normal limits  CBC WITH DIFFERENTIAL/PLATELET  MAGNESIUM   TROPONIN I (HIGH SENSITIVITY)  TROPONIN I (HIGH SENSITIVITY)    EKG A-fib with RVR, rate of 114 bpm.  Leftward axis, right bundle, no STEMI.  Previous EKGs demonstrate the similar right bundle but no A-fib.  RADIOLOGY CT head interpreted by me without evidence of acute intracranial pathology CT cervical spine interpreted by me without evidence of fracture or dislocation  Official radiology report(s): CT Cervical Spine Wo Contrast Result Date: 09/22/2023 CLINICAL DATA:  Slipped and fell, hit back of head, scalp laceration EXAM: CT CERVICAL SPINE WITHOUT CONTRAST TECHNIQUE: Multidetector CT imaging of the cervical spine was performed without intravenous contrast. Multiplanar CT image reconstructions were also  generated. RADIATION DOSE REDUCTION: This exam was performed according to the departmental dose-optimization program which includes automated exposure control, adjustment of the mA and/or kV according to patient size and/or use of iterative reconstruction technique. COMPARISON:  12/10/2022 FINDINGS: Alignment: Mild degenerative anterolisthesis of C6 on C7. Otherwise alignment is anatomic. Skull base and vertebrae: No acute fracture. No primary bone lesion or focal pathologic process. Soft tissues and spinal canal: No prevertebral fluid or swelling. No visible canal hematoma. Disc levels: Severe multilevel facet hypertrophic changes, with bony fusion across the left C2-3 and C4-5 facets and right C3-4 facet. Extensive multilevel spondylosis with bridging anterior osteophyte again noted unchanged. Significant left-sided neural foraminal encroachment at C4-5 and right predominant neural foraminal encroachment at C5-6. Upper chest: Airway is patent.  Lung apices are clear. Other: Reconstructed images demonstrate no additional findings. IMPRESSION: 1. No acute cervical spine fracture. 2. Extensive multilevel cervical degenerative changes as above. Electronically Signed   By: Ozell Daring M.D.   On: 09/22/2023 16:11   CT HEAD WO CONTRAST ( ) Result Date: 09/22/2023 CLINICAL DATA:  Clemens, scalp laceration EXAM: CT HEAD WITHOUT CONTRAST TECHNIQUE: Contiguous axial images were obtained from the base of the skull through the vertex without intravenous contrast. RADIATION DOSE REDUCTION: This exam was performed according to the departmental dose-optimization program which includes automated exposure control, adjustment of the mA and/or kV according to patient size and/or use of iterative reconstruction technique. COMPARISON:  12/10/2022 FINDINGS: Brain: No acute infarct or hemorrhage. Lateral ventricles and midline structures appear unremarkable. No acute extra-axial fluid collections. No mass effect. Vascular: No  hyperdense vessel or unexpected calcification. Skull: Normal. Negative for fracture or focal lesion. Sinuses/Orbits: No acute finding. Other: None. IMPRESSION: 1. No acute intracranial process. Electronically Signed   By: Ozell Daring M.D.   On: 09/22/2023 16:08    PROCEDURES and INTERVENTIONS:  .Critical Care  Performed by: Claudene Rover, MD Authorized by: Claudene Rover, MD   Critical care provider statement:    Critical care time (minutes):  30   Critical care time was exclusive of:  Separately billable procedures and treating other patients   Critical care was necessary to treat or prevent imminent or life-threatening deterioration of the following conditions:  Cardiac failure   Critical care was time spent personally by me on the following activities:  Development of treatment plan with patient or surrogate, discussions with consultants, evaluation of patient's response to treatment, examination of patient, ordering and review of laboratory studies, ordering and review of radiographic studies, ordering and performing treatments and interventions, pulse oximetry, re-evaluation of patient's  condition and review of old charts .1-3 Lead EKG Interpretation  Performed by: Claudene Rover, MD Authorized by: Claudene Rover, MD     Interpretation: abnormal     ECG rate:  131   ECG rate assessment: tachycardic     Rhythm: atrial fibrillation     Ectopy: none     Conduction: normal   .Laceration Repair  Date/Time: 09/22/2023 8:12 PM  Performed by: Claudene Rover, MD Authorized by: Claudene Rover, MD   Consent:    Consent obtained:  Verbal   Consent given by:  Patient and spouse   Risks, benefits, and alternatives were discussed: yes   Laceration details:    Location:  Scalp   Scalp location:  Occipital   Length (cm):  4 Exploration:    Hemostasis achieved with:  Direct pressure   Imaging obtained comment:  CT   Imaging outcome: foreign body not noted     Contaminated: no   Treatment:     Area cleansed with:  Povidone-iodine   Amount of cleaning:  Standard   Irrigation solution:  Sterile saline Skin repair:    Repair method:  Staples   Number of staples:  4 Approximation:    Approximation:  Close Repair type:    Repair type:  Simple Post-procedure details:    Dressing:  Antibiotic ointment   Procedure completion:  Tolerated well, no immediate complications   Medications  metoprolol  tartrate (LOPRESSOR ) tablet 25 mg (25 mg Oral Given 09/22/23 1550)  bacitracin  ointment (1 Application Topical Given 09/22/23 1805)     IMPRESSION / MDM / ASSESSMENT AND PLAN / ED COURSE  I reviewed the triage vital signs and the nursing notes.  Differential diagnosis includes, but is not limited to, ICH, skull fracture, scalp laceration, acute intoxication, electrolyte derangement, syncope, stroke or seizure  {Patient presents with symptoms of an acute illness or injury that is potentially life-threatening.  Pleasant patient presents to the ED after what sounds like a mechanical fall with a scalp laceration requiring staple repair.  While here he is noted to be in A-fib with RVR which seems new for the patient.  A-fib is asymptomatic.   He look systemically well and no further signs of trauma beyond occipital subcutaneous laceration that is repaired with 4 staples.  Workup is generally benign with normal electrolytes, 2 negative troponins, normal CBC.  Ethanol mildly positive.  I considered admission for this patient but he is comfortable going home and I think would be reasonable.  As separately documented I recommend anticoagulation we have a long discussion about this and he ultimately declines due to bleeding risks.  He is started on metoprolol  tartrate, placed an urgent cardiology referral.  He has previously follow-up with his PCP in about 10 days.  We discussed ensuring he makes his appointment for staple removal, reevaluation, continued discussion regarding anticoagulation.   Discussed close ED return precautions.  Clinical Course as of 09/22/23 2014  Sat Sep 22, 2023  1745 Reassessed.  Patient reports feeling great.  Heart rate is controlled with metoprolol .  I placed 4 staples in his scalp after cleaning.  I sit down with him and have a long discussion about A-fib, staple removal, cardiology referral, PCP follow-up.  I answered his questions.  He is eager to go home and comfortable with this.  I again cover anticoagulation and similar as my first conversation he does not want to start anticoagulation, acknowledging the risk of stroke. [DS]    Clinical Course User Index [DS] Claudene,  Ester, MD     FINAL CLINICAL IMPRESSION(S) / ED DIAGNOSES   Final diagnoses:  Fall, initial encounter  Laceration of scalp, initial encounter  New onset atrial fibrillation (HCC)     Rx / DC Orders   ED Discharge Orders          Ordered    metoprolol  tartrate (LOPRESSOR ) 25 MG tablet  2 times daily        09/22/23 1755    Ambulatory referral to Cardiology       Comments: If you have not heard from the Cardiology office within the next 72 hours please call [phone].   09/22/23 1758             Note:  This document was prepared using Dragon voice recognition software and may include unintentional dictation errors.   Claudene Ester, MD 09/22/23 2015

## 2023-09-22 NOTE — ED Triage Notes (Signed)
 Pt was getting into back of pick-up truck and slipped on liner and pt fell and hit back of head, bleeding profusely, about 20 minutes ago. No active bleeding at this time. Pt denies LOC, denies thinners. AOX4, NAD noted. Laceration to back of head noted.

## 2023-09-22 NOTE — ED Notes (Signed)
 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

## 2023-09-22 NOTE — Discharge Instructions (Addendum)
 As we discussed you have signs of new onset atrial fibrillation (A-fib).   You are being discharged with a new medication called metoprolol /Lopressor  to take twice daily every day to control your heart rate and try to reduce A-fib.  If you develop significant dizziness, passing out, chest pain, trouble breathing or other concerns of please return to the ED.  As we discussed, you are at risk for stroke due to your heart being in this different rhythm called A-fib.  We talked about starting a blood thinner but after discussing this at length you declined considering you are actively drinking again and more likely to fall.  You have been referred to cardiology.  The cardiology clinic should call you for an appointment  Follow-up with your PCP with the upcoming appointment in about 1 week  We placed 4 staples in your scalp, these can be removed at the PCP office when you see them.   It is okay to shower with the staples, let water run over the area and gently wash with soap and water.  After patting dry apply Neosporin, bacitracin  or Vaseline ointment over the staples to help with healing and preventing infection

## 2023-10-25 ENCOUNTER — Telehealth: Payer: Self-pay | Admitting: Nurse Practitioner

## 2023-10-25 NOTE — Telephone Encounter (Signed)
 Patient called back, I asked Zachary Fernandez to inform him that he has 2 refills remaining on last prescription for Tramadol , also to remind him that in the future, a prescription for Tramadol  cannot be called in, he must come for an appt.

## 2023-10-25 NOTE — Telephone Encounter (Signed)
 Tramadol  written on 09-29-23, with 2 refills.  Attempted to call patient, message left.

## 2023-10-25 NOTE — Telephone Encounter (Signed)
 Pt called for a refill f his tramadol . He stated he only has a few left and he is going out of town. He had it filled last on the 19th at [facility] on [address] in [place].

## 2023-10-29 ENCOUNTER — Other Ambulatory Visit: Payer: Self-pay | Admitting: Orthopedic Surgery

## 2023-10-29 ENCOUNTER — Ambulatory Visit
Admission: RE | Admit: 2023-10-29 | Discharge: 2023-10-29 | Disposition: A | Discharge status: Home / Self Care | Source: Ambulatory Visit | Attending: Orthopedic Surgery | Admitting: Orthopedic Surgery

## 2023-10-29 DIAGNOSIS — M7989 Other specified soft tissue disorders: Secondary | ICD-10-CM | POA: Insufficient documentation

## 2023-10-29 DIAGNOSIS — M79604 Pain in right leg: Secondary | ICD-10-CM | POA: Insufficient documentation

## 2023-11-15 ENCOUNTER — Ambulatory Visit: Admit: 2023-11-15 | Admitting: Gastroenterology

## 2023-11-15 SURGERY — EGD (ESOPHAGOGASTRODUODENOSCOPY)
Anesthesia: General

## 2023-11-21 ENCOUNTER — Other Ambulatory Visit: Payer: Self-pay | Admitting: Family Medicine

## 2023-11-21 DIAGNOSIS — R1900 Intra-abdominal and pelvic swelling, mass and lump, unspecified site: Secondary | ICD-10-CM

## 2023-12-09 DIAGNOSIS — M810 Age-related osteoporosis without current pathological fracture: Secondary | ICD-10-CM | POA: Insufficient documentation

## 2023-12-09 DIAGNOSIS — T162XXA Foreign body in left ear, initial encounter: Secondary | ICD-10-CM | POA: Insufficient documentation

## 2023-12-09 DIAGNOSIS — Z79899 Other long term (current) drug therapy: Secondary | ICD-10-CM | POA: Insufficient documentation

## 2023-12-09 DIAGNOSIS — H6123 Impacted cerumen, bilateral: Secondary | ICD-10-CM | POA: Insufficient documentation

## 2023-12-09 DIAGNOSIS — H903 Sensorineural hearing loss, bilateral: Secondary | ICD-10-CM | POA: Insufficient documentation

## 2023-12-09 DIAGNOSIS — Z0000 Encounter for general adult medical examination without abnormal findings: Secondary | ICD-10-CM | POA: Insufficient documentation

## 2023-12-09 DIAGNOSIS — H905 Unspecified sensorineural hearing loss: Secondary | ICD-10-CM | POA: Insufficient documentation

## 2023-12-09 DIAGNOSIS — M5481 Occipital neuralgia: Secondary | ICD-10-CM | POA: Insufficient documentation

## 2023-12-09 DIAGNOSIS — I10 Essential (primary) hypertension: Secondary | ICD-10-CM | POA: Insufficient documentation

## 2023-12-09 DIAGNOSIS — M4808 Spinal stenosis, sacral and sacrococcygeal region: Secondary | ICD-10-CM | POA: Insufficient documentation

## 2023-12-09 DIAGNOSIS — Z461 Encounter for fitting and adjustment of hearing aid: Secondary | ICD-10-CM | POA: Insufficient documentation

## 2023-12-11 ENCOUNTER — Ambulatory Visit: Attending: Nurse Practitioner | Admitting: Nurse Practitioner

## 2023-12-11 ENCOUNTER — Encounter: Payer: Self-pay | Admitting: Nurse Practitioner

## 2023-12-11 VITALS — BP 149/72 | HR 86 | Temp 97.3°F | Resp 18 | Ht 67.0 in | Wt 160.0 lb

## 2023-12-11 DIAGNOSIS — M5416 Radiculopathy, lumbar region: Secondary | ICD-10-CM | POA: Insufficient documentation

## 2023-12-11 DIAGNOSIS — G894 Chronic pain syndrome: Secondary | ICD-10-CM | POA: Diagnosis not present

## 2023-12-11 DIAGNOSIS — M4808 Spinal stenosis, sacral and sacrococcygeal region: Secondary | ICD-10-CM | POA: Diagnosis present

## 2023-12-11 DIAGNOSIS — G8929 Other chronic pain: Secondary | ICD-10-CM | POA: Insufficient documentation

## 2023-12-11 DIAGNOSIS — M48062 Spinal stenosis, lumbar region with neurogenic claudication: Secondary | ICD-10-CM | POA: Diagnosis present

## 2023-12-11 DIAGNOSIS — M5481 Occipital neuralgia: Secondary | ICD-10-CM | POA: Diagnosis present

## 2023-12-11 DIAGNOSIS — G588 Other specified mononeuropathies: Secondary | ICD-10-CM | POA: Diagnosis present

## 2023-12-11 DIAGNOSIS — M47816 Spondylosis without myelopathy or radiculopathy, lumbar region: Secondary | ICD-10-CM | POA: Insufficient documentation

## 2023-12-11 DIAGNOSIS — Z79899 Other long term (current) drug therapy: Secondary | ICD-10-CM | POA: Diagnosis present

## 2023-12-11 DIAGNOSIS — M533 Sacrococcygeal disorders, not elsewhere classified: Secondary | ICD-10-CM | POA: Diagnosis present

## 2023-12-11 MED ORDER — TRAMADOL HCL 50 MG PO TABS: 50.0000 mg | ORAL_TABLET | Freq: Four times a day (QID) | ORAL | 2 refills | Status: AC | PRN

## 2023-12-11 NOTE — Progress Notes (Signed)
 PROVIDER NOTE: Interpretation of information contained herein should be left to medically-trained personnel. Specific patient instructions are provided elsewhere under Patient Instructions section of medical record. This document was created in part using AI and STT-dictation technology, any transcriptional errors that may result from this process are unintentional.  Patient: Zachary Fernandez  Service: E/M   PCP: Epifanio Alm SQUIBB, MD  DOB: 1940/10/16  DOS: 12/11/2023  Provider: Emmy MARLA Blanch, NP  MRN: [id number]  Delivery: Face-to-face  Specialty: Interventional Pain Management  Type: Established Patient  Setting: Ambulatory outpatient facility  Specialty designation: 09  Referring Prov.: Epifanio Alm SQUIBB, MD  Location: Outpatient office facility       History of present illness (HPI) [name], a 83 y.o. year old male, is here today because of his Chronic pain syndrome [G89.4]. Zachary Fernandez primary complain today is Hip Pain and Leg Pain  Pertinent problems: Zachary Fernandez has Chronic SI joint pain; Status post hip replacement, bilateral; History of bilateral knee replacement; Spinal stenosis, lumbar region, with neurogenic claudication; Chronic radicular lumbar pain; Lumbar spondylosis; Chronic pain syndrome; Pain management contract signed; Whiplash injury to neck; Closed nondisplaced fracture of second cervical vertebra (HCC); Closed dislocation of right hip (HCC); HTN (hypertension); BPH (benign prostatic hyperplasia); Alcohol abuse; Fall at home, initial encounter; Thoracic vertebral fracture (HCC); Right rib fracture_7th and 8th rib; Alcohol withdrawal (HCC); Acute alcohol abuse, with delirium; Fall; Cluneal neuropathy; Arthritis of right wrist; Elevated LFTs; Pain of right lower extremity; Swelling of right lower extremity; Low back pain; Osteoporosis; Chronic pain; Sacroiliac joint pain; Arthropathy of lumbar facet joint; Lumbar radiculopathy; Spondylolisthesis, lumbar region; Bilateral  sensorineural hearing loss; and Sensorineural hearing loss (SNHL) on their pertinent problem list.  Pain Assessment: Severity of Chronic pain is reported as a 0-No pain/10. Location: Back Lower/Down right leg. Onset: More than a month ago. Quality: Aching, Burning. Timing: Intermittent. Modifying factor(s): Tramadol . Vitals:  height is 5' 7 (1.702 m) and weight is 160 lb (72.6 kg). His temporal temperature is 97.3 F (36.3 C) (abnormal). His blood pressure is 149/72 (abnormal) and his pulse is 86. His respiration is 18 and oxygen saturation is 100%.  BMI: Estimated body mass index is 25.06 kg/m as calculated from the following:   Height as of this encounter: 5' 7 (1.702 m).   Weight as of this encounter: 160 lb (72.6 kg).  Last encounter: 09/13/2023. Last procedure: Visit date not found.  Reason for encounter: medication management. No change in medical history since last visit.  Patient's pain is at baseline.  Patient continues multimodal pain regimen as prescribed.  States that it provides pain relief and improvement in functional status.   Discussed the use of AI scribe software for clinical note transcription with the patient, who gave verbal consent to proceed.  History of Present Illness   Zachary Fernandez is an 83 year old male who presents with right-sided pain and swelling following a fall.  He experienced right-sided pain and swelling after colliding with a fireplace, which did not result in a fall to the floor. The impact caused significant pain and swelling from the site of impact down to the ankle, initially making movement difficult. The swelling subsided significantly after three days, allowing him to walk again. However, he still experiences some pain, especially in humid conditions.  The injury occurred at the base of his titanium, nickel, and cobalt joint replacement, which he believes helped stabilize the area. He has a history of quick healing, but describes the initial  swelling as unprecedented in his experience.  He was initially prescribed codeine for pain management but was unable to tolerate it, leading him to use more tramadol  than prescribed. He has been taking tramadol  for a long time and reports that it provides pain relief without any euphoric effects.  Pharmacotherapy Assessment   Tramadol  (Ultram ) 50 mg tablet every 6 hours as needed for pain. MME=40 Monitoring: [place] PMP: PDMP reviewed during this encounter.       Pharmacotherapy: No side-effects or adverse reactions reported. Compliance: No problems identified. Effectiveness: Clinically acceptable.  Erlene Doyal SAUNDERS, [place]  12/11/2023 11:55 AM  Sign when Signing Visit Nursing Pain Medication Assessment:  Safety precautions to be maintained throughout the outpatient stay will include: orient to surroundings, keep bed in low position, maintain call bell within reach at all times, provide assistance with transfer out of bed and ambulation.  Medication Inspection Compliance: Pill count conducted under aseptic conditions, in front of the patient. Neither the pills nor the bottle was removed from the patient's sight at any time. Once count was completed pills were immediately returned to the patient in their original bottle.  Medication: Tramadol  (Ultram ) Pill/Patch Count: 38 of 120 pills/patches remain Pill/Patch Appearance: Markings consistent with prescribed medication Bottle Appearance: Standard pharmacy container. Clearly labeled. Filled Date: 8 / 39 / 2025 Last Medication intake:  Day before yesterday    UDS:  Summary  Date Value Ref Range Status  09/13/2023 FINAL  Final    Comment:    ==================================================================== [employer] (MW) ==================================================================== Test                             Result       Flag       Units  Drug Present and Declared for Prescription Verification   Tramadol                         >6173        EXPECTED   ng/mg creat   O-Desmethyltramadol            >6173        EXPECTED   ng/mg creat   N-Desmethyltramadol            3669         EXPECTED   ng/mg creat    Source of tramadol  is a prescription medication. O-desmethyltramadol    and N-desmethyltramadol are expected metabolites of tramadol .  Drug Present not Declared for Prescription Verification   Carboxy-THC                    189          UNEXPECTED ng/mg creat    Carboxy-THC is a metabolite of tetrahydrocannabinol (THC). Source of    THC is most commonly herbal marijuana or marijuana-based products,    but THC is also present in a scheduled prescription medication.    Trace amounts of THC can be present in hemp and cannabidiol (CBD)    products. This test is not intended to distinguish between delta-9-    tetrahydrocannabinol, the predominant form of THC in most herbal or    marijuana-based products, and delta-8-tetrahydrocannabinol.  ==================================================================== Test                      Result    Flag   Units      Ref Range  Creatinine              81               mg/dL      >=79 ==================================================================== Declared Medications:  The flagging and interpretation on this report are based on the  following declared medications.  Unexpected results may arise from  inaccuracies in the declared medications.   **Note: The testing scope of this panel includes these medications:   Tramadol  (Ultram )   **Note: The testing scope of this panel does not include the  following reported medications:   Acetaminophen   Calcium   Cyanocobalamin   Desipramine  (Norpramin )  Esomeprazole (Nexium)  Eye Drops  Finasteride (Proscar)  Krill Oil  Meloxicam (Mobic)  Metoprolol  (Lopressor )  Polyethylene Glycol (MiraLAX )  Rosuvastatin  (Crestor )  Supplement  Tadalafil (Cialis)  Tamsulosin  (Flomax )  Testosterone  Topical  Trazodone    Turmeric  Vitamin B1  Vitamin E  Zinc  ==================================================================== For clinical consultation, please call [phone]. ====================================================================     No results found for: CBDTHCR No results found for: D8THCCBX No results found for: D9THCCBX  ROS  Constitutional: Denies any fever or chills Gastrointestinal: No reported hemesis, hematochezia, vomiting, or acute GI distress Musculoskeletal: Right hip pain, (Right) leg pain Neurological: No reported episodes of acute onset apraxia, aphasia, dysarthria, agnosia, amnesia, paralysis, loss of coordination, or loss of consciousness  Medication Review  Calcium  Citrate, Krill Oil, Lotilaner, Multiple Vitamins-Minerals, Propylene Glycol, Turmeric, acetaminophen , desipramine , esomeprazole, finasteride, meloxicam, metoprolol  tartrate, polyethylene glycol, rosuvastatin , saw palmetto, tadalafil, tamsulosin , testosterone, thiamine , traMADol , traZODone , vitamin B-12, vitamin E, and zinc  gluconate  History Review  Allergy: Mr. Fuster is allergic to other and codeine. Drug: Mr. Pulis  reports no history of drug use. Alcohol:  reports current alcohol use. Tobacco:  reports that he has never smoked. He has never used smokeless tobacco. Social: Mr. Droke  reports that he has never smoked. He has never used smokeless tobacco. He reports current alcohol use. He reports that he does not use drugs. Medical:  has a past medical history of EtOH dependence (HCC), HLD (hyperlipidemia), and Hypertension. Surgical: Mr. Bamba  has a past surgical history that includes Revision total hip arthroplasty (Bilateral); Knee Arthroplasty (Left); Shoulder surgery (Left); Cholecystectomy; Hip Closed Reduction (Right, 12/12/2021); and Esophagogastroduodenoscopy (N/A, 08/02/2023). Family: family history includes Diabetes in his brother.  Laboratory Chemistry Profile   Renal Lab Results   Component Value Date   BUN 18 09/22/2023   CREATININE 0.78 09/22/2023   GFRNONAA >60 09/22/2023    Hepatic Lab Results  Component Value Date   AST 26 09/22/2023   ALT 21 09/22/2023   ALBUMIN 3.8 09/22/2023   ALKPHOS 79 09/22/2023    Electrolytes Lab Results  Component Value Date   NA 141 09/22/2023   K 4.3 09/22/2023   CL 104 09/22/2023   CALCIUM  9.0 09/22/2023   MG 1.8 09/22/2023   PHOS 3.9 12/16/2022    Bone Lab Results  Component Value Date   VD25OH 34.15 12/13/2022    Inflammation (CRP: Acute Phase) (ESR: Chronic Phase) Lab Results  Component Value Date   LATICACIDVEN 1.3 12/14/2022         Note: Above Lab results reviewed.  Recent Imaging Review  US  Venous Img Lower Unilateral Right (DVT) CLINICAL DATA:  RIGHT lower extremity swelling x2 weeks.  OTHER SPECIFIED SOFT TISSUE DISORDERS/RULE OUT DVT/RIGHT LEG SWELLING  EXAM: RIGHT LOWER EXTREMITY VENOUS DOPPLER ULTRASOUND  TECHNIQUE: Gray-scale sonography with compression, as well as color and duplex ultrasound,  were performed to evaluate the deep venous system(s) from the level of the common femoral vein through the popliteal and proximal calf veins.  COMPARISON:  CT CAP, 10/27/2022  FINDINGS: VENOUS  Normal compressibility of the common femoral, superficial femoral, and popliteal veins, as well as the visualized calf veins. Visualized portions of profunda femoral vein and great saphenous vein unremarkable. No filling defects to suggest DVT on grayscale or color Doppler imaging. Doppler waveforms show normal direction of venous flow, normal respiratory plasticity and response to augmentation.  Limited views of the contralateral common femoral vein are unremarkable.  OTHER  No evidence of superficial thrombophlebitis.  At the posterior RIGHT knee within the fossa is a well-circumscribed anechoic and avascular fluid collection measuring approximately 1.7 x 0.6 x 1.1 cm, consistent with a  popliteal fossa/Baker cyst.  Subcutaneous edema of the imaged distal RIGHT lower extremity  Limitations: none  IMPRESSION: 1. No evidence of femoropopliteal DVT or superficial thrombophlebitis within the RIGHT lower extremity. 2. 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  Thom Hall, MD  [facility]  [facility]  Electronically Signed   By: Thom Hall M.D.   On: 10/29/2023 16:26 Note: Reviewed        Physical Exam  Vitals: BP (!) 149/72 (BP Location: Right Arm, Patient Position: Sitting, Cuff Size: Normal)   Pulse 86   Temp (!) 97.3 F (36.3 C) (Temporal)   Resp 18   Ht 5' 7 (1.702 m)   Wt 160 lb (72.6 kg)   SpO2 100%   BMI 25.06 kg/m  BMI: Estimated body mass index is 25.06 kg/m as calculated from the following:   Height as of this encounter: 5' 7 (1.702 m).   Weight as of this encounter: 160 lb (72.6 kg). Ideal: Ideal body weight: 66.1 kg (145 lb 11.6 oz) Adjusted ideal body weight: 68.7 kg (151 lb 7 oz) General appearance: Well nourished, well developed, and well hydrated. In no apparent acute distress Mental status: Alert, oriented x 3 (person, place, & time)       Respiratory: No evidence of acute respiratory distress Eyes: PERLA  Musculoskeletal: Right hip pain, right leg pain Assessment   Diagnosis Status  1. Chronic pain syndrome   2. Chronic SI joint pain   3. Medication management   4. Chronic radicular lumbar pain   5. Lumbar spondylosis   6. Cluneal neuropathy   7. Spinal stenosis, sacral and sacrococcygeal region   8. Spinal stenosis, lumbar region, with neurogenic claudication   9. Bilateral occipital neuralgia    Controlled Controlled Controlled   Updated Problems: No problems updated.  Plan of Care  Problem-specific:  Assessment and Plan    Chronic pain syndrome Managed with tramadol  due to intolerance to codeine. - Continue tramadol  four times daily. - Refilled tramadol   prescription on December 11th, 2025.  Chronic sacroiliac joint pain Pain persists post-trauma but managed with tramadol .  Medication management for chronic pain Tramadol  effective and well-tolerated without adverse or euphoric effects. - Ensure tramadol  prescription is refilled on December 11th, 2025, at [facility] on [address].  Patient's pain is controlled with tramadol , will continue on current medication regimen.  Prescribing drug monitoring (PDMP) reviewed, findings consistent with the use of prescribed medication and no evidence of narcotic misuse or abuse.  No adverse reaction or side effects reported to medication.  Schedule follow-up in 90 days for medication management.     Mr. Yahsir Wickens has a current medication list which includes  the following long-term medication(s): calcium  citrate, desipramine , esomeprazole, metoprolol  tartrate, metoprolol  tartrate, rosuvastatin , tadalafil, and trazodone .  Pharmacotherapy (Medications Ordered): Meds ordered this encounter  Medications   traMADol  (ULTRAM ) 50 MG tablet    Sig: Take 1 tablet (50 mg total) by mouth every 6 (six) hours as needed for severe pain (pain score 7-10).    Dispense:  120 tablet    Refill:  2    May fill three day early (12/20/2023)   Orders:  No orders of the defined types were placed in this encounter.     Return in about 3 months (around 03/10/2024) for (F2F), (MM), Emmy Blanch NP.    Recent Visits Date Type Provider Dept  09/13/23 Office Visit Bernice Mullin K, NP [facility]  Showing recent visits within past 90 days and meeting all other requirements Today's Visits Date Type Provider Dept  12/11/23 Office Visit Sheppard Luckenbach K, NP [facility]  Showing today's visits and meeting all other requirements Future Appointments Date Type Provider Dept  03/06/24 Appointment Gerrod Maule K, NP [facility]  Showing future appointments within next 90 days and meeting all other  requirements  I discussed the assessment and treatment plan with the patient. The patient was provided an opportunity to ask questions and all were answered. The patient agreed with the plan and demonstrated an understanding of the instructions.  Patient advised to call back or seek an in-person evaluation if the symptoms or condition worsens.  I personally spent a total of 30 minutes in the care of the patient today including preparing to see the patient, getting/reviewing separately obtained history, performing a medically appropriate exam/evaluation, counseling and educating, placing orders, referring and communicating with other health care professionals, documenting clinical information in the EHR, independently interpreting results, communicating results, and coordinating care.   Note by: Laprecious Austill K Bryona Foxworthy, NP (TTS and AI technology used. I apologize for any typographical errors that were not detected and corrected.) Date: 12/11/2023; Time: 1:04 PM

## 2023-12-11 NOTE — Progress Notes (Signed)
 Nursing Pain Medication Assessment:  Safety precautions to be maintained throughout the outpatient stay will include: orient to surroundings, keep bed in low position, maintain call bell within reach at all times, provide assistance with transfer out of bed and ambulation.  Medication Inspection Compliance: Pill count conducted under aseptic conditions, in front of the patient. Neither the pills nor the bottle was removed from the patient's sight at any time. Once count was completed pills were immediately returned to the patient in their original bottle.  Medication: Tramadol  (Ultram ) Pill/Patch Count: 38 of 120 pills/patches remain Pill/Patch Appearance: Markings consistent with prescribed medication Bottle Appearance: Standard pharmacy container. Clearly labeled. Filled Date: 40 / 59 / 2025 Last Medication intake:  Day before yesterday

## 2023-12-13 ENCOUNTER — Ambulatory Visit: Admission: RE | Admit: 2023-12-13 | Discharge: 2023-12-13 | Attending: Family Medicine | Admitting: Family Medicine

## 2023-12-13 DIAGNOSIS — R1900 Intra-abdominal and pelvic swelling, mass and lump, unspecified site: Secondary | ICD-10-CM | POA: Insufficient documentation

## 2023-12-24 ENCOUNTER — Other Ambulatory Visit: Payer: Self-pay | Admitting: Infectious Diseases

## 2023-12-24 DIAGNOSIS — R7303 Prediabetes: Secondary | ICD-10-CM

## 2023-12-24 DIAGNOSIS — R1900 Intra-abdominal and pelvic swelling, mass and lump, unspecified site: Secondary | ICD-10-CM

## 2024-01-09 ENCOUNTER — Ambulatory Visit
Admission: RE | Admit: 2024-01-09 | Discharge: 2024-01-09 | Disposition: A | Discharge status: Home / Self Care | Source: Ambulatory Visit | Attending: Infectious Diseases | Admitting: Infectious Diseases

## 2024-01-09 DIAGNOSIS — R7303 Prediabetes: Secondary | ICD-10-CM | POA: Insufficient documentation

## 2024-01-09 DIAGNOSIS — R1900 Intra-abdominal and pelvic swelling, mass and lump, unspecified site: Secondary | ICD-10-CM | POA: Insufficient documentation

## 2024-01-09 MED ORDER — IOHEXOL 300 MG/ML  SOLN
100.0000 mL | Freq: Once | INTRAMUSCULAR | Status: AC | PRN
  Administered 2024-01-09: 100 mL via INTRAVENOUS

## 2024-03-06 ENCOUNTER — Encounter: Admitting: Nurse Practitioner
